# Patient Record
Sex: Female | Born: 1945 | Race: White | Hispanic: No | Marital: Married | Smoking: Never smoker
Health system: Southern US, Community
[De-identification: ages and names within clinical notes are randomized; demographics above are authoritative.]

## PROBLEM LIST (undated history)

## (undated) DIAGNOSIS — I10 Essential (primary) hypertension: Secondary | ICD-10-CM

## (undated) DIAGNOSIS — H269 Unspecified cataract: Secondary | ICD-10-CM

## (undated) DIAGNOSIS — T7840XA Allergy, unspecified, initial encounter: Secondary | ICD-10-CM

## (undated) DIAGNOSIS — M1990 Unspecified osteoarthritis, unspecified site: Secondary | ICD-10-CM

## (undated) DIAGNOSIS — B009 Herpesviral infection, unspecified: Secondary | ICD-10-CM

## (undated) DIAGNOSIS — M8580 Other specified disorders of bone density and structure, unspecified site: Secondary | ICD-10-CM

## (undated) DIAGNOSIS — E0590 Thyrotoxicosis, unspecified without thyrotoxic crisis or storm: Secondary | ICD-10-CM

## (undated) HISTORY — PX: TONSILLECTOMY: SUR1361

## (undated) HISTORY — PX: ABDOMINAL HYSTERECTOMY: SHX81

## (undated) HISTORY — PX: APPENDECTOMY: SHX54

## (undated) HISTORY — DX: Thyrotoxicosis, unspecified without thyrotoxic crisis or storm: E05.90

## (undated) HISTORY — DX: Unspecified osteoarthritis, unspecified site: M19.90

## (undated) HISTORY — PX: CARPAL TUNNEL RELEASE: SHX101

## (undated) HISTORY — DX: Essential (primary) hypertension: I10

## (undated) HISTORY — DX: Allergy, unspecified, initial encounter: T78.40XA

## (undated) HISTORY — DX: Other specified disorders of bone density and structure, unspecified site: M85.80

## (undated) HISTORY — DX: Herpesviral infection, unspecified: B00.9

## (undated) HISTORY — DX: Unspecified cataract: H26.9

## (undated) HISTORY — PX: FOOT SURGERY: SHX648

---

## 1999-02-07 ENCOUNTER — Other Ambulatory Visit: Admission: RE | Admit: 1999-02-07 | Discharge: 1999-02-07 | Payer: Self-pay | Admitting: *Deleted

## 1999-08-07 ENCOUNTER — Encounter: Payer: Self-pay | Admitting: *Deleted

## 1999-08-07 ENCOUNTER — Encounter: Admission: RE | Admit: 1999-08-07 | Discharge: 1999-08-07 | Payer: Self-pay | Admitting: *Deleted

## 1999-08-09 ENCOUNTER — Encounter: Payer: Self-pay | Admitting: *Deleted

## 1999-08-09 ENCOUNTER — Encounter: Admission: EM | Admit: 1999-08-09 | Discharge: 1999-08-09 | Payer: Self-pay | Admitting: *Deleted

## 2000-02-27 ENCOUNTER — Other Ambulatory Visit: Admission: RE | Admit: 2000-02-27 | Discharge: 2000-02-27 | Payer: Self-pay | Admitting: Family Medicine

## 2000-08-13 ENCOUNTER — Encounter: Admission: RE | Admit: 2000-08-13 | Discharge: 2000-08-13 | Payer: Self-pay | Admitting: Family Medicine

## 2000-08-13 ENCOUNTER — Encounter: Payer: Self-pay | Admitting: Family Medicine

## 2001-04-16 LAB — HM COLONOSCOPY: HM Colonoscopy: NORMAL

## 2001-04-29 ENCOUNTER — Ambulatory Visit (HOSPITAL_COMMUNITY): Admission: RE | Admit: 2001-04-29 | Discharge: 2001-04-29 | Payer: Self-pay | Admitting: Gastroenterology

## 2001-04-29 ENCOUNTER — Encounter (INDEPENDENT_AMBULATORY_CARE_PROVIDER_SITE_OTHER): Payer: Self-pay

## 2001-11-03 ENCOUNTER — Encounter: Payer: Self-pay | Admitting: Family Medicine

## 2001-11-03 ENCOUNTER — Encounter: Admission: RE | Admit: 2001-11-03 | Discharge: 2001-11-03 | Payer: Self-pay | Admitting: Family Medicine

## 2002-03-04 ENCOUNTER — Other Ambulatory Visit: Admission: RE | Admit: 2002-03-04 | Discharge: 2002-03-04 | Payer: Self-pay | Admitting: Family Medicine

## 2003-05-05 ENCOUNTER — Encounter: Admission: RE | Admit: 2003-05-05 | Discharge: 2003-05-05 | Payer: Self-pay | Admitting: Family Medicine

## 2004-01-05 ENCOUNTER — Encounter: Admission: RE | Admit: 2004-01-05 | Discharge: 2004-01-05 | Payer: Self-pay | Admitting: Family Medicine

## 2004-05-15 ENCOUNTER — Ambulatory Visit: Payer: Self-pay | Admitting: Family Medicine

## 2004-05-16 ENCOUNTER — Encounter: Admission: RE | Admit: 2004-05-16 | Discharge: 2004-05-16 | Payer: Self-pay | Admitting: Family Medicine

## 2004-05-18 ENCOUNTER — Ambulatory Visit: Payer: Self-pay | Admitting: Family Medicine

## 2004-05-18 ENCOUNTER — Other Ambulatory Visit: Admission: RE | Admit: 2004-05-18 | Discharge: 2004-05-18 | Payer: Self-pay | Admitting: Family Medicine

## 2004-05-19 ENCOUNTER — Encounter: Payer: Self-pay | Admitting: Family Medicine

## 2004-05-19 LAB — CONVERTED CEMR LAB: Pap Smear: NORMAL

## 2004-10-23 ENCOUNTER — Ambulatory Visit: Payer: Self-pay | Admitting: Family Medicine

## 2005-01-17 ENCOUNTER — Ambulatory Visit: Payer: Self-pay | Admitting: Family Medicine

## 2005-02-19 ENCOUNTER — Ambulatory Visit: Payer: Self-pay | Admitting: Family Medicine

## 2005-04-25 ENCOUNTER — Ambulatory Visit: Payer: Self-pay | Admitting: Family Medicine

## 2005-05-30 ENCOUNTER — Ambulatory Visit: Payer: Self-pay | Admitting: Family Medicine

## 2005-06-01 ENCOUNTER — Encounter: Admission: RE | Admit: 2005-06-01 | Discharge: 2005-06-01 | Payer: Self-pay | Admitting: Family Medicine

## 2005-06-07 ENCOUNTER — Ambulatory Visit: Payer: Self-pay | Admitting: Family Medicine

## 2005-06-08 ENCOUNTER — Ambulatory Visit: Payer: Self-pay | Admitting: Family Medicine

## 2005-07-19 ENCOUNTER — Ambulatory Visit: Payer: Self-pay | Admitting: Internal Medicine

## 2005-09-13 ENCOUNTER — Ambulatory Visit: Payer: Self-pay | Admitting: Family Medicine

## 2005-09-19 ENCOUNTER — Ambulatory Visit: Payer: Self-pay | Admitting: Family Medicine

## 2005-12-25 ENCOUNTER — Ambulatory Visit: Payer: Self-pay | Admitting: Family Medicine

## 2006-03-05 ENCOUNTER — Ambulatory Visit: Payer: Self-pay | Admitting: Family Medicine

## 2006-04-25 ENCOUNTER — Ambulatory Visit: Payer: Self-pay | Admitting: Family Medicine

## 2006-05-16 ENCOUNTER — Encounter: Admission: RE | Admit: 2006-05-16 | Discharge: 2006-05-16 | Payer: Self-pay | Admitting: Family Medicine

## 2006-06-05 ENCOUNTER — Ambulatory Visit: Payer: Self-pay | Admitting: Family Medicine

## 2006-06-05 LAB — CONVERTED CEMR LAB
AST: 29 units/L (ref 0–37)
Alkaline Phosphatase: 57 units/L (ref 39–117)
BUN: 20 mg/dL (ref 6–23)
Basophils Relative: 0.6 % (ref 0.0–1.0)
CO2: 30 meq/L (ref 19–32)
Creatinine, Ser: 0.8 mg/dL (ref 0.4–1.2)
Direct LDL: 118.5 mg/dL
HCT: 38.4 % (ref 36.0–46.0)
Hemoglobin: 13.3 g/dL (ref 12.0–15.0)
Monocytes Absolute: 0.7 10*3/uL (ref 0.2–0.7)
Neutrophils Relative %: 57.8 % (ref 43.0–77.0)
Potassium: 4 meq/L (ref 3.5–5.1)
RDW: 12.7 % (ref 11.5–14.6)
Sodium: 142 meq/L (ref 135–145)
Total Bilirubin: 0.7 mg/dL (ref 0.3–1.2)
Total CHOL/HDL Ratio: 3
Total Protein: 7 g/dL (ref 6.0–8.3)
VLDL: 12 mg/dL (ref 0–40)

## 2006-06-07 ENCOUNTER — Ambulatory Visit: Payer: Self-pay | Admitting: Family Medicine

## 2006-06-12 ENCOUNTER — Ambulatory Visit: Payer: Self-pay | Admitting: Family Medicine

## 2006-07-01 ENCOUNTER — Ambulatory Visit: Payer: Self-pay | Admitting: Internal Medicine

## 2006-08-27 LAB — CONVERTED CEMR LAB
OCCULT 3: NEGATIVE
OCCULT 3: NEGATIVE

## 2006-08-28 LAB — CONVERTED CEMR LAB
OCCULT 2: NEGATIVE
OCCULT 2: NEGATIVE

## 2006-09-06 ENCOUNTER — Ambulatory Visit: Payer: Self-pay | Admitting: Family Medicine

## 2006-09-10 ENCOUNTER — Encounter (INDEPENDENT_AMBULATORY_CARE_PROVIDER_SITE_OTHER): Payer: Self-pay | Admitting: *Deleted

## 2006-09-18 ENCOUNTER — Telehealth (INDEPENDENT_AMBULATORY_CARE_PROVIDER_SITE_OTHER): Payer: Self-pay | Admitting: *Deleted

## 2006-09-25 ENCOUNTER — Encounter: Admission: RE | Admit: 2006-09-25 | Discharge: 2006-09-25 | Payer: Self-pay | Admitting: Family Medicine

## 2006-10-21 ENCOUNTER — Encounter: Payer: Self-pay | Admitting: Family Medicine

## 2006-10-21 DIAGNOSIS — G5600 Carpal tunnel syndrome, unspecified upper limb: Secondary | ICD-10-CM

## 2006-10-21 DIAGNOSIS — B009 Herpesviral infection, unspecified: Secondary | ICD-10-CM | POA: Insufficient documentation

## 2006-10-21 DIAGNOSIS — G43909 Migraine, unspecified, not intractable, without status migrainosus: Secondary | ICD-10-CM | POA: Insufficient documentation

## 2006-10-21 DIAGNOSIS — G44209 Tension-type headache, unspecified, not intractable: Secondary | ICD-10-CM

## 2006-10-21 DIAGNOSIS — Z862 Personal history of diseases of the blood and blood-forming organs and certain disorders involving the immune mechanism: Secondary | ICD-10-CM | POA: Insufficient documentation

## 2006-10-21 DIAGNOSIS — M8580 Other specified disorders of bone density and structure, unspecified site: Secondary | ICD-10-CM | POA: Insufficient documentation

## 2006-10-21 DIAGNOSIS — J309 Allergic rhinitis, unspecified: Secondary | ICD-10-CM | POA: Insufficient documentation

## 2006-10-21 DIAGNOSIS — N6019 Diffuse cystic mastopathy of unspecified breast: Secondary | ICD-10-CM

## 2006-10-21 DIAGNOSIS — Z8639 Personal history of other endocrine, nutritional and metabolic disease: Secondary | ICD-10-CM

## 2006-10-22 ENCOUNTER — Ambulatory Visit: Payer: Self-pay | Admitting: Family Medicine

## 2007-01-27 ENCOUNTER — Ambulatory Visit: Payer: Self-pay | Admitting: Family Medicine

## 2007-01-29 ENCOUNTER — Telehealth: Payer: Self-pay | Admitting: Family Medicine

## 2007-04-21 ENCOUNTER — Ambulatory Visit: Payer: Self-pay | Admitting: Family Medicine

## 2007-05-22 ENCOUNTER — Encounter: Admission: RE | Admit: 2007-05-22 | Discharge: 2007-05-22 | Payer: Self-pay | Admitting: Family Medicine

## 2007-05-23 ENCOUNTER — Encounter: Payer: Self-pay | Admitting: Family Medicine

## 2007-05-23 ENCOUNTER — Encounter (INDEPENDENT_AMBULATORY_CARE_PROVIDER_SITE_OTHER): Payer: Self-pay | Admitting: *Deleted

## 2007-05-27 ENCOUNTER — Ambulatory Visit: Payer: Self-pay | Admitting: Family Medicine

## 2007-05-27 DIAGNOSIS — I10 Essential (primary) hypertension: Secondary | ICD-10-CM | POA: Insufficient documentation

## 2007-05-29 LAB — CONVERTED CEMR LAB
Basophils Relative: 0.8 % (ref 0.0–1.0)
Bilirubin, Direct: 0.1 mg/dL (ref 0.0–0.3)
CO2: 32 meq/L (ref 19–32)
Eosinophils Absolute: 0.3 10*3/uL (ref 0.0–0.6)
Eosinophils Relative: 4.4 % (ref 0.0–5.0)
GFR calc Af Amer: 94 mL/min
GFR calc non Af Amer: 78 mL/min
Glucose, Bld: 89 mg/dL (ref 70–99)
HCT: 39.5 % (ref 36.0–46.0)
Hemoglobin: 13.1 g/dL (ref 12.0–15.0)
Lymphocytes Relative: 28.8 % (ref 12.0–46.0)
MCV: 90.9 fL (ref 78.0–100.0)
Monocytes Absolute: 0.6 10*3/uL (ref 0.2–0.7)
Neutro Abs: 3.3 10*3/uL (ref 1.4–7.7)
Neutrophils Relative %: 55.9 % (ref 43.0–77.0)
Platelets: 259 10*3/uL (ref 150–400)
Potassium: 4.2 meq/L (ref 3.5–5.1)
Sodium: 141 meq/L (ref 135–145)
TSH: 2.91 microintl units/mL (ref 0.35–5.50)
Total Protein: 7.4 g/dL (ref 6.0–8.3)
Triglycerides: 102 mg/dL (ref 0–149)
WBC: 5.9 10*3/uL (ref 4.5–10.5)

## 2007-06-20 ENCOUNTER — Ambulatory Visit: Payer: Self-pay | Admitting: Family Medicine

## 2007-06-24 ENCOUNTER — Ambulatory Visit: Payer: Self-pay | Admitting: Cardiology

## 2007-06-26 ENCOUNTER — Encounter: Admission: RE | Admit: 2007-06-26 | Discharge: 2007-06-26 | Payer: Self-pay | Admitting: Family Medicine

## 2007-07-01 ENCOUNTER — Ambulatory Visit: Payer: Self-pay

## 2007-07-01 ENCOUNTER — Encounter: Payer: Self-pay | Admitting: Family Medicine

## 2007-07-07 ENCOUNTER — Ambulatory Visit: Payer: Self-pay | Admitting: Family Medicine

## 2007-07-21 ENCOUNTER — Ambulatory Visit: Payer: Self-pay | Admitting: Family Medicine

## 2007-09-15 ENCOUNTER — Ambulatory Visit: Payer: Self-pay | Admitting: Family Medicine

## 2007-10-27 ENCOUNTER — Ambulatory Visit: Payer: Self-pay | Admitting: Family Medicine

## 2007-12-18 ENCOUNTER — Ambulatory Visit: Payer: Self-pay | Admitting: Family Medicine

## 2008-01-14 ENCOUNTER — Ambulatory Visit: Payer: Self-pay | Admitting: Family Medicine

## 2008-06-03 ENCOUNTER — Encounter: Admission: RE | Admit: 2008-06-03 | Discharge: 2008-06-03 | Payer: Self-pay | Admitting: Family Medicine

## 2008-06-08 ENCOUNTER — Encounter (INDEPENDENT_AMBULATORY_CARE_PROVIDER_SITE_OTHER): Payer: Self-pay | Admitting: *Deleted

## 2008-07-20 ENCOUNTER — Telehealth (INDEPENDENT_AMBULATORY_CARE_PROVIDER_SITE_OTHER): Payer: Self-pay | Admitting: *Deleted

## 2008-07-22 ENCOUNTER — Ambulatory Visit: Payer: Self-pay | Admitting: Family Medicine

## 2008-07-25 LAB — CONVERTED CEMR LAB
ALT: 17 units/L (ref 0–35)
Albumin: 3.7 g/dL (ref 3.5–5.2)
BUN: 18 mg/dL (ref 6–23)
Basophils Relative: 0.5 % (ref 0.0–3.0)
CO2: 29 meq/L (ref 19–32)
Chloride: 107 meq/L (ref 96–112)
Cholesterol: 177 mg/dL (ref 0–200)
Creatinine, Ser: 0.8 mg/dL (ref 0.4–1.2)
Eosinophils Absolute: 0.2 10*3/uL (ref 0.0–0.7)
Eosinophils Relative: 4 % (ref 0.0–5.0)
HCT: 37.9 % (ref 36.0–46.0)
LDL Cholesterol: 113 mg/dL — ABNORMAL HIGH (ref 0–99)
Lymphs Abs: 1.5 10*3/uL (ref 0.7–4.0)
MCHC: 33.3 g/dL (ref 30.0–36.0)
MCV: 91.7 fL (ref 78.0–100.0)
Monocytes Absolute: 0.7 10*3/uL (ref 0.1–1.0)
Neutrophils Relative %: 59.5 % (ref 43.0–77.0)
Potassium: 4.5 meq/L (ref 3.5–5.1)
RBC: 4.13 M/uL (ref 3.87–5.11)
TSH: 0.86 microintl units/mL (ref 0.35–5.50)
Total CHOL/HDL Ratio: 4
Total Protein: 6.8 g/dL (ref 6.0–8.3)
Triglycerides: 90 mg/dL (ref 0.0–149.0)
Vit D, 25-Hydroxy: 32 ng/mL (ref 30–89)
WBC: 5.9 10*3/uL (ref 4.5–10.5)

## 2008-07-29 ENCOUNTER — Ambulatory Visit: Payer: Self-pay | Admitting: Family Medicine

## 2008-07-29 ENCOUNTER — Encounter: Payer: Self-pay | Admitting: Family Medicine

## 2008-07-29 LAB — CONVERTED CEMR LAB
Glucose, Urine, Semiquant: NEGATIVE
Nitrite: NEGATIVE
Specific Gravity, Urine: <1.005
Whiff Test: NEGATIVE
pH: 7

## 2008-07-29 LAB — HM PAP SMEAR

## 2008-08-03 ENCOUNTER — Telehealth: Payer: Self-pay | Admitting: Family Medicine

## 2008-09-21 ENCOUNTER — Telehealth: Payer: Self-pay | Admitting: Family Medicine

## 2008-10-01 ENCOUNTER — Telehealth (INDEPENDENT_AMBULATORY_CARE_PROVIDER_SITE_OTHER): Payer: Self-pay | Admitting: *Deleted

## 2008-11-05 ENCOUNTER — Ambulatory Visit: Payer: Self-pay | Admitting: Family Medicine

## 2008-11-05 LAB — CONVERTED CEMR LAB
Ketones, urine, test strip: NEGATIVE
Nitrite: NEGATIVE
Protein, U semiquant: NEGATIVE
Urobilinogen, UA: 0.2
pH: 6

## 2009-01-26 ENCOUNTER — Ambulatory Visit: Payer: Self-pay | Admitting: Family Medicine

## 2009-03-22 ENCOUNTER — Telehealth: Payer: Self-pay | Admitting: Family Medicine

## 2009-03-23 ENCOUNTER — Ambulatory Visit: Payer: Self-pay | Admitting: Family Medicine

## 2009-03-23 DIAGNOSIS — H8110 Benign paroxysmal vertigo, unspecified ear: Secondary | ICD-10-CM | POA: Insufficient documentation

## 2009-04-07 ENCOUNTER — Encounter: Payer: Self-pay | Admitting: Family Medicine

## 2009-05-30 ENCOUNTER — Ambulatory Visit: Payer: Self-pay | Admitting: Family Medicine

## 2009-06-13 ENCOUNTER — Encounter: Admission: RE | Admit: 2009-06-13 | Discharge: 2009-06-13 | Payer: Self-pay | Admitting: Family Medicine

## 2009-07-05 ENCOUNTER — Encounter (INDEPENDENT_AMBULATORY_CARE_PROVIDER_SITE_OTHER): Payer: Self-pay | Admitting: *Deleted

## 2009-08-16 ENCOUNTER — Telehealth: Payer: Self-pay | Admitting: Family Medicine

## 2009-08-22 ENCOUNTER — Ambulatory Visit: Payer: Self-pay | Admitting: Family Medicine

## 2009-08-22 DIAGNOSIS — E7800 Pure hypercholesterolemia, unspecified: Secondary | ICD-10-CM

## 2009-08-22 LAB — CONVERTED CEMR LAB
Alkaline Phosphatase: 61 units/L (ref 39–117)
Basophils Absolute: 0 10*3/uL (ref 0.0–0.1)
Bilirubin, Direct: 0 mg/dL (ref 0.0–0.3)
CO2: 30 meq/L (ref 19–32)
Calcium: 9.4 mg/dL (ref 8.4–10.5)
Creatinine, Ser: 0.7 mg/dL (ref 0.4–1.2)
Eosinophils Absolute: 0.3 10*3/uL (ref 0.0–0.7)
GFR calc non Af Amer: 85.94 mL/min (ref >60)
Glucose, Bld: 102 mg/dL — ABNORMAL HIGH (ref 70–99)
HDL: 56 mg/dL (ref >39.00)
Lymphocytes Relative: 30.1 % (ref 12.0–46.0)
MCHC: 34.4 g/dL (ref 30.0–36.0)
Monocytes Relative: 10.1 % (ref 3.0–12.0)
Neutrophils Relative %: 54.3 % (ref 43.0–77.0)
RBC: 4.12 M/uL (ref 3.87–5.11)
RDW: 13.8 % (ref 11.5–14.6)
Total CHOL/HDL Ratio: 3
Triglycerides: 69 mg/dL (ref 0.0–149.0)
VLDL: 13.8 mg/dL (ref 0.0–40.0)

## 2009-08-25 ENCOUNTER — Ambulatory Visit: Payer: Self-pay | Admitting: Family Medicine

## 2009-08-25 DIAGNOSIS — M79609 Pain in unspecified limb: Secondary | ICD-10-CM | POA: Insufficient documentation

## 2009-08-29 ENCOUNTER — Ambulatory Visit: Payer: Self-pay | Admitting: Family Medicine

## 2009-08-29 DIAGNOSIS — M545 Low back pain: Secondary | ICD-10-CM

## 2009-08-29 DIAGNOSIS — M76899 Other specified enthesopathies of unspecified lower limb, excluding foot: Secondary | ICD-10-CM | POA: Insufficient documentation

## 2009-08-29 DIAGNOSIS — M5430 Sciatica, unspecified side: Secondary | ICD-10-CM

## 2009-08-29 DIAGNOSIS — M629 Disorder of muscle, unspecified: Secondary | ICD-10-CM | POA: Insufficient documentation

## 2009-08-30 ENCOUNTER — Telehealth: Payer: Self-pay | Admitting: Family Medicine

## 2009-09-01 ENCOUNTER — Encounter: Admission: RE | Admit: 2009-09-01 | Discharge: 2009-09-01 | Payer: Self-pay | Admitting: Family Medicine

## 2010-05-16 NOTE — Progress Notes (Signed)
Summary: BP is better  Phone Note Call from Patient Call back at [phone]   Caller: Patient Call For: [name] MD Summary of Call: Pt saw Dr Patsy Lager on 08/29/09 and BP was 132/84. Pt wants to know if still needs to come in in 2 weeks to f/u on BP with a nurse visit. pt has another f/u appt with Dr copland on 10/07/09. Please advise.  Initial call taken by: Lewanda Rife LPN,  Aug 30, 2009 8:37 AM  Follow-up for Phone Call        thanks - bp is better- can cancel that nurse visit  Follow-up by: [name] MD,  Aug 30, 2009 12:25 PM  Additional Follow-up for Phone Call Additional follow up Details #1::        Patient notified as instructed by telephone. Nurse visit cancelled as instructed.Lewanda Rife LPN  Aug 30, 2009 12:45 PM

## 2010-05-16 NOTE — Assessment & Plan Note (Signed)
Summary: ROA FOR LEG PAIN AND BACK PAIN PER DR.TOWER/JRR   Vital Signs:  Patient profile:   65 year old female Weight:      176 pounds Temp:     98.2 degrees F oral Pulse rate:   72 / minute Pulse rhythm:   regular BP sitting:   132 / 84  (left arm) Cuff size:   regular  Vitals Entered By: Lowella Petties CMA (Aug 29, 2009 3:36 PM)  CC: Bilateral leg pain, pain across lower back.   History of Present Illness: 65 year old female seen at the request of Dr. Milinda Antis for evaluation of hip pain, buttocks pain, back pain with radiculopathy on the L.  Hips ongoing since March, no inciting incident or trauma. When pressed, lateral hip pain has been ongoing now for about a year. Has tried intermittent Motrin, and a TENS unit. which helped some. o/w no interventions. 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  1. Primary pain L lateral leg, moving downwards, will get some pain and a severe  Cannot cross legs - hurts L laterally. Some in groin. Pain in her lower back   Will get a throbbing pain in her lower leg.  Down posterior and laterally. Did have some numbness last week in L toes. None now.  Has been ongoing since march. Felt a little bit on the R on sat. - but generally most on L   No traumas. Here with husband.  Allergies: 1)  Ceftin 2)  * Zyrtec 3)  * Allegra 4)  Prednisone 5)  Beta Blockers 6)  * Eggs 7)  * Antihistamines  Past History:  Past medical, surgical, family and social histories (including risk factors) reviewed, and no changes noted (except as noted below).  Past Medical History: Reviewed history from 12/18/2007 and no changes required. Allergic rhinitis Osteopenia HTN HSV  Past Surgical History: Reviewed history from 06/20/2007 and no changes required. Appendectomy Hysterectomy- for bleeding and ov cyst Tonsillectomy Dexa- normal (2000) Bone marrow biopsy (1610'R) EGD- biopsy, benign (04/2001). CT sinuses- sinusitis (05/2001) NCV- neg (01/2004) Dexa-  normal (04/2004) Colonoscopy- hemorrhoids (04/2001) Dexa- osteopenia LS-.5, FN -1.0 (09/2006)    Family History: Reviewed history from 06/20/2007 and no changes required. Father: deceased-, smoker, ETOH, esoph cancer Mother: deceased- 5 vess CABG Siblings:  brother CAD sister RA   Social History: Reviewed history from 06/20/2007 and no changes required. Marital Status: Married Children: 1 son Occupation: waitress quit smoking (brief smoking in past)- over 20 years ago  Review of Systems      See HPI       Otherwise, the pertinent positives and negatives are listed above and in the HPI, otherwise a full review of systems has been reviewed and is negative unless noted positive.  General:  Denies chills and fever. MS:  Complains of joint pain, low back pain, muscle weakness, and stiffness; denies joint redness and joint swelling. Neuro:  Complains of numbness and tingling.  Physical Exam  General:  Well-developed,well-nourished,in no acute distress; alert,appropriate and cooperative throughout examination Head:  Normocephalic and atraumatic without obvious abnormalities. No apparent alopecia or balding. Ears:  no external deformities.   Nose:  no external deformity.   Neck:  No deformities, masses, or tenderness noted. Lungs:  normal respiratory effort.   Extremities:  No clubbing, cyanosis, edema, or deformity noted with normal full range of motion of all joints.   Neurologic:  alert & oriented X3 and gait normal.   Psych:  Cognition and judgment  appear intact. Alert and cooperative with normal attention span and concentration. No apparent delusions, illusions, hallucinations Additional Exam:  Range of motion at  the waist: full Flexion: mild pain Extension: mild pain Rotation: mild pain  No echymosis or edema Rises to examination table with mild difficulty Gait: minimally antalgic   Inspection/Deformity: N Paraspinus T: Y  B Ankle Dorsiflexion (L5,4): 5/5 B Great  Toe Dorsiflexion (L5,4): 5/5 Heel Walk (L5): WNL Toe Walk (S1): WNL Rise/Squat (L4): WNL, mild pain  SENSORY B Medial Foot (L4): WNL B Dorsum (L5): WNL B Lateral (S1): WNL Light Touch: WNL Pinprick: WNL  REFLEXES Knee (L4): 2+ Ankle (S1): 2+  B SLR, seated: neg B SLR, supine: neg B FABER: neg B Reverse FABER: L markedly Pos B Greater Troch: L > R, tender B Log Roll: neg B Stork: NT B Sciatic Notch: TTP B Leg Lengths: equal   HIP EXAM: SIDE: L ROM: Abduction, Flexion, Internal and External range of motion: full Pain with terminal IROM and EROM: mild with both Knees: No effusion Piriformis: tender at direct palpation Str: flexion: 5/5 abduction: 5/5 adduction: 5/5 Strength testing non-tender    Impression & Recommendations:  Problem # 1:  TROCHANTERIC BURSITIS, LEFT (ICD-726.5) Multi-part problem. Back pain and SI joint pain B with considerable troch bursitis. ITB is very TTP.  Habitus and work situation contribute. Long term PT with weight loss and HEP daily will likely help.  Start with meds below, too, and inj GTB  cc: Dr. Milinda Antis   Trochanteric Bursitis Injection Verbal consent obtained. Risks, benefits, and alternatives reviewed. L greater trochanter sterilely prepped with Betadine. Ethyl Chloride used for anesthesia. 8 cc of Marcaine 0.5% injected with 2 cc of 40 mg Kenalog into trochanteric bursa at area of maximal tenderness at greater trochanter. Needle taken to bone to troch bursa, flows easily. Bursa massaged. No bleeding and no complications. Decreased pain after injection. Needle: 22 gauge spinal needle   Orders: Physical Therapy Referral (PT) Joint Aspirate / Injection, Large (20610) Kenalog 10mg  (4units) (J3301)  Problem # 2:  SCIATICA, LEFT (ICD-724.3) Assessment: New  The following medications were removed from the medication list:    Ibuprofen 800 Mg Tabs (Ibuprofen) .Marland Kitchen... Take one tablet every 8 hours as needed for pain- take with food Her  updated medication list for this problem includes:    Diclofenac Sodium 75 Mg Tbec (Diclofenac sodium) .Marland Kitchen... 1 by mouth two times a day    Tizanidine Hcl 4 Mg Tabs (Tizanidine hcl) .Marland Kitchen... 1 by mouth at bedtime  Orders: Radiology Referral (Radiology) Physical Therapy Referral (PT)  Problem # 3:  BACK PAIN, LUMBAR (ICD-724.2) Assessment: New  The following medications were removed from the medication list:    Ibuprofen 800 Mg Tabs (Ibuprofen) .Marland Kitchen... Take one tablet every 8 hours as needed for pain- take with food Her updated medication list for this problem includes:    Diclofenac Sodium 75 Mg Tbec (Diclofenac sodium) .Marland Kitchen... 1 by mouth two times a day    Tizanidine Hcl 4 Mg Tabs (Tizanidine hcl) .Marland Kitchen... 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 Take one by mouth daily 2)  Valtrex 500 Mg Tabs (Valacyclovir hcl) .... Take one by mouth daily as needed for break out 3)  Synthroid 125 Mcg Tabs (Levothyroxine  sodium) .... Take one by mouth daily 4)  Glucosamine Complex Tabs (Glucosamine hcl-glucosamin so4 tabs) .... Take one by mouth daily 5)  Vitamin B-12 500 Mcg Tabs (Cyanocobalamin) .... Take one by mouth daily 6)  Relpax 40 Mg Tabs (Eletriptan hydrobromide) .... Take 1 tablet by mouth once a day as needed 7)  Vitamin E 400 Unit Caps (Vitamin e) .... Take one by mouth daily 8)  Multivitamins Tabs (Multiple vitamin) .... Take 1 tablet by mouth once a day 9)  Calcium Carbonate- 600  .... 2 tabs by mouth daily 10)  Celery Seed Tablets Otc  .Marland Kitchen.. 3 daily 11)  Promethazine Hcl 25 Mg Tabs (Promethazine hcl) .Marland Kitchen.. 1 by mouth up to three times a day as needed nausea 12)  Claritin 10 Mg Tabs (Loratadine) .Marland Kitchen.. 1 once daily as needed 13)  Meclizine Hcl 25  Mg Tabs (Meclizine hcl) .Marland Kitchen.. 1-2 by mouth up to three times a day as needed dizziness 14)  Mucinex 600 Mg Xr12h-tab (Guaifenesin) .... Otc as directed. 15)  Voltaren 1 % Gel (Diclofenac sodium) .... As directed 16)  Zovirax 5 % Crea (Acyclovir) .... Apply to affected area 5 times daily as needed breakout 17)  Diclofenac Sodium 75 Mg Tbec (Diclofenac sodium) .Marland Kitchen.. 1 by mouth two times a day 18)  Tizanidine Hcl 4 Mg Tabs (Tizanidine hcl) .Marland Kitchen.. 1 by mouth at bedtime  Patient Instructions: 1)  Referral Appointment Information 2)  Day/Date: 3)  Time: 4)  Place/MD: 5)  Address: 6)  Phone/Fax: 7)  Patient given appointment information. Information/Orders faxed/mailed.  Prescriptions: TIZANIDINE HCL 4 MG TABS (TIZANIDINE HCL) 1 by mouth at bedtime  #30 x 1   Entered and Authorized by:   Hannah Beat MD   Signed by:   Hannah Beat MD on 08/29/2009   Method used:   Print then Give to Patient   RxID:   [id number] DICLOFENAC SODIUM 75 MG TBEC (DICLOFENAC SODIUM) 1 by mouth two times a day  #60 x 2   Entered and Authorized by:   Hannah Beat MD   Signed by:   Hannah Beat MD on 08/29/2009   Method used:   Print then Give to Patient   RxID:   [id number]   Prior Medications (reviewed today): NORVASC 5 MG  TABS (AMLODIPINE BESYLATE) take one by mouth daily VALTREX 500 MG  TABS (VALACYCLOVIR HCL) take one by mouth daily as needed for break out SYNTHROID 125 MCG  TABS (LEVOTHYROXINE SODIUM) take one by mouth daily GLUCOSAMINE COMPLEX   TABS (GLUCOSAMINE HCL-GLUCOSAMIN SO4 TABS) take one by mouth daily VITAMIN B-12 500 MCG  TABS (CYANOCOBALAMIN) take one by mouth daily RELPAX 40 MG TABS (ELETRIPTAN HYDROBROMIDE) Take 1 tablet by mouth once a day as needed VITAMIN E 400 UNIT  CAPS (VITAMIN E) take one by mouth daily MULTIVITAMINS   TABS (MULTIPLE VITAMIN) Take 1 tablet by mouth once a day CALCIUM CARBONATE- 600 () 2 tabs by mouth daily CELERY SEED TABLETS OTC () 3  daily PROMETHAZINE HCL 25 MG TABS (PROMETHAZINE HCL) 1 by mouth up to three times a day as needed nausea CLARITIN 10 MG TABS (LORATADINE) 1 once daily as needed MECLIZINE HCL 25 MG TABS (MECLIZINE HCL) 1-2 by mouth up to three times a day as needed dizziness MUCINEX 600 MG XR12H-TAB (GUAIFENESIN) OTC As directed. VOLTAREN 1 % GEL (DICLOFENAC SODIUM) as directed ZOVIRAX 5 % CREA (ACYCLOVIR) apply to affected area 5 times daily as needed breakout Current Allergies: CEFTIN * ZYRTEC *  ALLEGRA PREDNISONE BETA BLOCKERS * EGGS * ANTIHISTAMINES

## 2010-05-16 NOTE — Assessment & Plan Note (Signed)
Summary: CPX/CLE   Vital Signs:  Patient profile:   65 year old female Height:      63 inches Weight:      174.25 pounds BMI:     30.98 Temp:     98.4 degrees F oral Pulse rate:   72 / minute Pulse rhythm:   regular BP sitting:   154 / 82  (left arm) Cuff size:   regular  Vitals Entered By: Lewanda Rife LPN (Aug 25, 2009 10:57 AM) CC: CPX LMP [id number]   History of Present Illness: here for health mt and to rev chronic medical problems  bp is 154/82- higher than usual   (very hectic 2 weeks caring for sick MIL) has not exercised due to stress - will be able to start next week   wt is stable with bmi of 30  hyst in past for bleeding  pap 4/10  osteopenia - last dexa 6/10 her vit D level is 25 down from 32 has not been taking her vitamins   mam nl 2/11  no lumps or changes on self exam   colonosc 03 -- due 10 y  Td 02  lipids ok with trig 69/ HDL 56/ and LDL 119-- is stable , higher HDL and eating right    thyroid stable - no changes needed   lot of trouble with L leg - pain from hip down - uses ibuprofen and tens unit also chronic lower back pain trouble ext rot hip   Allergies: 1)  Ceftin 2)  * Zyrtec 3)  * Allegra 4)  Prednisone 5)  Beta Blockers 6)  * Eggs 7)  * Antihistamines  Past History:  Past Medical History: Last updated: 12/18/2007 Allergic rhinitis Osteopenia HTN HSV  Past Surgical History: Last updated: 2007/06/23 Appendectomy Hysterectomy- for bleeding and ov cyst Tonsillectomy Dexa- normal (2000) Bone marrow biopsy (1980's) EGD- biopsy, benign (04/2001). 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 Eyes:  Denies blurring and eye pain. CV:  Denies chest pain or discomfort, lightheadness, and palpitations. Resp:  Denies cough, shortness of breath, and wheezing. GU:  Denies abnormal vaginal bleeding, discharge, dysuria, hematuria, and urinary frequency. MS:  Complains of joint pain, muscle aches, and stiffness; denies joint redness, joint swelling, and cramps. Derm:  Denies itching, lesion(s), poor wound healing, and rash. Neuro:  Denies numbness and tingling. Psych:  Denies anxiety and depression. Endo:  Denies cold intolerance, excessive thirst, excessive urination, and heat intolerance. Heme:  Denies abnormal bruising and bleeding.  Physical Exam  General:  overweight but generally well appearing  Head:  normocephalic, atraumatic, and no abnormalities observed.   Eyes:  vision grossly intact, pupils equal, and pupils round.   Ears:  R ear normal and L ear normal.   Nose:  no nasal discharge.  - nares are boggy Mouth:  pharynx pink and moist.   Neck:  supple with full rom and no masses or thyromegally, no JVD or carotid bruit  Chest Wall:  No deformities, masses, or tenderness  noted. Lungs:  CTA with harsh bs at bases and occ rhonchi  no rales  scant wheeze on forced exp only  Heart:  Normal rate and regular rhythm. S1 and S2 normal without gallop, murmur, click, rub or other extra sounds. Abdomen:  Bowel sounds positive,abdomen soft and non-tender without masses, organomegaly or hernias noted. no renal bruits  Msk:  No deformity or scoliosis noted of thoracic or lumbar spine.  some pain on elevation of L leg and full flex of knee no LS tenderness- nl rom Pulses:  R and L carotid,radial,femoral,dorsalis pedis and posterior tibial  pulses are full and equal bilaterally Extremities:  No clubbing, cyanosis, edema, or deformity noted with normal full range of motion of all joints.   Neurologic:  strength normal in all extremities, sensation intact to light touch, gait normal, and DTRs symmetrical and normal.   Skin:  Intact without suspicious lesions or rashes Cervical Nodes:  No lymphadenopathy noted Inguinal Nodes:  No significant adenopathy Psych:  normal affect, talkative and pleasant    Impression & Recommendations:  Problem # 1:  HEALTH MAINTENANCE EXAM (ICD-V70.0) Assessment Comment Only reviewed health habits including diet, exercise and skin cancer prevention reviewed health maintenance list and family history rev labs in detail today  Problem # 2:  ESSENTIAL HYPERTENSION (ICD-401.9) Assessment: Deteriorated  bp is up today- poss from stress and ibuprofen and no exercise for 2 wk will get back to regular routine and cut down the ibuprofen nurse visit for bp 2 wk- if still up will need to adj tx refilled med disc healthy diet (low simple sugar/ choose complex carbs/ low sat fat) diet and exercise in detail  Her updated medication list for this problem includes:    Norvasc 5 Mg Tabs (Amlodipine besylate) .Marland Kitchen... Take one by mouth daily  BP today: 154/82 Prior BP: 138/76 (05/30/2009)  Labs Reviewed: K+: 4.2 (08/22/2009) Creat: : 0.7 (08/22/2009)   Chol: 189 (08/22/2009)   HDL: 56.00 (08/22/2009)   LDL: 119 (08/22/2009)   TG: 69.0 (08/22/2009)  Problem # 3:  HYPERCHOLESTEROLEMIA (ICD-272.0) Assessment: Unchanged  good control with diet --better HDL rev low sat fat diet and enc further good work rev lab with pt today  Labs Reviewed: SGOT: 26 (08/22/2009)   SGPT: 28 (08/22/2009)   HDL:56.00 (08/22/2009), 46.30 (07/22/2008)  LDL:119 (08/22/2009), 113 (07/22/2008)  Chol:189 (08/22/2009), 177 (07/22/2008)  Trig:69.0 (08/22/2009), 90.0 (07/22/2008)  Problem # 4:  Hx of THYROID DISEASE, HX OF  (ICD-V12.2) Assessment: Unchanged no change in tsh - no change clinically will continue current synthroid dose  rev lab today  Problem # 5:  OSTEOPENIA (ICD-733.90) Assessment: Unchanged dexa in 2010 - mild osteopenia watcing D level which is down- due to not taking it  adv to get 2000 international units vit D daily (also her ca and exercise) re check next year both dexa and D  Problem # 6:  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 Take one by mouth daily 2)  Valtrex 500 Mg Tabs (Valacyclovir hcl) .... Take one by mouth daily as needed for break out 3)  Synthroid 125 Mcg Tabs (Levothyroxine sodium) .... Take one by mouth daily 4)  Glucosamine Complex Tabs (  Glucosamine hcl-glucosamin so4 tabs) .... Take one by mouth daily 5)  Vitamin B-12 500 Mcg Tabs (Cyanocobalamin) .... Take one by mouth daily 6)  Relpax 40 Mg Tabs (Eletriptan hydrobromide) .... Take 1 tablet by mouth once a day as needed 7)  Vitamin E 400 Unit Caps (Vitamin e) .... Take one by mouth daily 8)  Multivitamins Tabs (Multiple vitamin) .... Take 1 tablet by mouth once a day 9)  Calcium Carbonate- 600  .... 2 tabs by mouth daily 10)  Celery Seed Tablets Otc  .Marland Kitchen.. 3 daily 11)  Promethazine Hcl 25 Mg Tabs (Promethazine hcl) .Marland Kitchen.. 1 by mouth up to three times a day as needed nausea 12)  Claritin 10 Mg Tabs (Loratadine) .Marland Kitchen.. 1 once daily as needed 13)  Meclizine Hcl 25 Mg Tabs (Meclizine hcl) .Marland Kitchen.. 1-2 by mouth up to three times a day as needed dizziness 14)  Mucinex 600 Mg Xr12h-tab (Guaifenesin) .... Otc as directed. 15)  Ibuprofen 800 Mg Tabs (Ibuprofen) .... Take one tablet every 8 hours as needed for pain- take  with food 16)  Voltaren 1 % Gel (Diclofenac sodium) .... As directed 17)  Zovirax 5 % Crea (Acyclovir) .... Apply to affected area 5 times daily as needed breakout  Patient Instructions: 1)  aim for 2000 international units vit D daily 2)  also take your calcium - for bone health  3)  please schedule appt with Dr Patsy Lager for leg and back pain at check out  4)  no change in medicines  5)  schedule nurse visit in 2 week for bp check  6)  if bp is still high - we may need to adjust medicine  Prescriptions: ZOVIRAX 5 % CREA (ACYCLOVIR) apply to affected area 5 times daily as needed breakout  #3 months x 3   Entered and Authorized by:   [name] MD   Signed by:   [name] MD on 08/25/2009   Method used:   Print then Give to Patient   RxID:   [id number] IBUPROFEN 800 MG TABS (IBUPROFEN) take one tablet every 8 hours as needed for pain- take with food  #60 x 1   Entered and Authorized by:   [name] MD   Signed by:   [name] MD on 08/25/2009   Method used:   Electronically to        [facility] [phone]* (retail)       [address] [place], [place]  [address]       Ph: [phone] or [phone]       Fax: [phone]   RxID:   [id number] SYNTHROID 125 MCG  TABS (LEVOTHYROXINE SODIUM) take one by mouth daily  #90 x 3   Entered and Authorized by:   [name] MD   Signed by:   [name] MD on 08/25/2009   Method used:   Print then Give to Patient   RxID:   [id number] SYNTHROID 125 MCG  TABS (LEVOTHYROXINE SODIUM) take one by mouth daily  #15 x 0   Entered and Authorized by:   [name] MD   Signed by:   [name] MD on 08/25/2009   Method used:   Electronically to        [facility] [phone]* (retail)       [address] [place]  [place], [place]  [address]       Ph: [phone] or [phone]       Fax: [phone]   RxID:   [id number] VALTREX 500 MG  TABS  (VALACYCLOVIR HCL) take one by mouth daily as needed for break out Brand medically necessary #60 x 3   Entered and Authorized by:   [name] MD   Signed by:   [name] MD on 08/25/2009   Method used:   Print then Give to Patient   RxID:   [id number] NORVASC 5 MG  TABS (AMLODIPINE BESYLATE) take one by mouth daily  #90 x 3   Entered and Authorized by:   [name] MD   Signed by:   [name] MD on 08/25/2009   Method used:   Print then Give to Patient   RxID:   [id number]   Current Allergies (reviewed today): CEFTIN * ZYRTEC * ALLEGRA PREDNISONE BETA BLOCKERS * EGGS * ANTIHISTAMINES

## 2010-05-16 NOTE — Assessment & Plan Note (Signed)
Summary: BAD COLD  CYD   Vital Signs:  Patient profile:   65 year old female Height:      63 inches Weight:      175 pounds BMI:     31.11 Temp:     98.9 degrees F oral Pulse rate:   64 / minute Pulse rhythm:   regular BP sitting:   138 / 76  (left arm) Cuff size:   regular  Vitals Entered By: Lewanda Rife LPN (May 30, 2009 12:41 PM)  History of Present Illness: started with cold symptoms on wednesday- sneezing  then runny nose and stuffy nose followed by cough severe cough on thursday started on mucinex fever low grade- started on ibuprofen 800 (that she had)  friday -- croupy harsh cough - and go delsym for cough-- did not help at all  rock n rye did help in a pinch  slept all day fri and saturday  then found some ry tanna  -- and those did help some   has been using an inscentive spirometer left over from surgery   is a bit better today  yellow phlegm  Allergies: 1)  Ceftin 2)  * Zyrtec 3)  * Allegra 4)  Claritin 5)  Prednisone 6)  Beta Blockers 7)  * Eggs 8)  * Antihistamines  Past History:  Past Medical History: Last updated: 12/18/2007 Allergic rhinitis Osteopenia HTN HSV  Past Surgical History: Last updated: 07-19-07 Appendectomy Hysterectomy- for bleeding and ov cyst Tonsillectomy Dexa- normal (2000) Bone marrow biopsy (1980's) EGD- biopsy, benign (04/2001). CT sinuses- sinusitis (05/2001) NCV- neg (01/2004) Dexa- normal (04/2004) Colonoscopy- hemorrhoids (04/2001) Dexa- osteopenia LS-.5, FN -1.0 (09/2006)  Family History: Last updated: 07/19/2007 Father: deceased-, smoker, ETOH, esoph cancer Mother: deceased- 5 vess CABG Siblings:  brother CAD sister RA   Social History: Last updated: 07/19/07 Marital Status: Married Children: 1 son Occupation: waitress quit smoking (brief smoking in past)- over 20 years ago  Risk Factors: Smoking Status: quit (10/21/2006)  Review of Systems General:  Complains of chills, fatigue,  fever, loss of appetite, and malaise. Eyes:  Denies blurring and eye irritation. ENT:  Complains of earache, hoarseness, nasal congestion, postnasal drainage, sinus pressure, and sore throat. CV:  Denies chest pain or discomfort, lightheadness, and palpitations. Resp:  Complains of cough and sputum productive; denies pleuritic, shortness of breath, and wheezing. GI:  Denies abdominal pain, bloody stools, and change in bowel habits. Derm:  Denies rash.  Physical Exam  General:  Well-developed,well-nourished,in no acute distress; alert,appropriate and cooperative throughout examination Head:  normocephalic, atraumatic, and no abnormalities observed.  no sinus tenderness Eyes:  vision grossly intact, pupils equal, pupils round, pupils reactive to light, and no injection.   Ears:  R ear normal and L ear normal.  --overall dull  Nose:  nares are congested and injected - worse in R Mouth:  pharynx pink and moist, no erythema, and no exudates.   Neck:  No deformities, masses, or tenderness noted. Lungs:  CTA with harsh bs at bases and occ rhonchi  no rales  scant wheeze on forced exp only  Heart:  Normal rate and regular rhythm. S1 and S2 normal without gallop, murmur, click, rub or other extra sounds. Skin:  Intact without suspicious lesions or rashes Cervical Nodes:  No lymphadenopathy noted Psych:  normal affect, talkative and pleasant    Impression & Recommendations:  Problem # 1:  BRONCHITIS- ACUTE (ICD-466.0) Assessment New with persistant cough and mild reactive airways - worsening after  uri will cover with zpak and update  tussionex with caution for severe cough expectorant as needed pt advised to update me if symptoms worsen or do not improve - esp if wheezing or fever  The following medications were removed from the medication list:    Augmentin 875-125 Mg Tabs (Amoxicillin-pot clavulanate) .Marland Kitchen... 1 by mouth two times a day for 10 days Her updated medication list for this  problem includes:    Mucinex 600 Mg Xr12h-tab (Guaifenesin) ..... Otc as directed.    R-tanna 9-25 Mg Tabs (Chlorphen tan-phenyleph tan) .Marland Kitchen... Take one to two tabs by mouth every twelve hours as need for cough and congestion    Zithromax Z-pak 250 Mg Tabs (Azithromycin) .Marland Kitchen... Take by mouth as directed    Tussionex Pennkinetic Er 8-10 Mg/45ml Lqcr (Chlorpheniramine-hydrocodone) .Marland Kitchen... 1/2-1 teaspoon by mouth up to every 12 hours as needed severe cough  Complete Medication List: 1)  Norvasc 5 Mg Tabs (Amlodipine besylate) .... Take one by mouth daily 2)  Valtrex 500 Mg Tabs (Valacyclovir hcl) .... Take one by mouth daily prn 3)  Synthroid 125 Mcg Tabs (Levothyroxine sodium) .... Take one by mouth daily 4)  Glucosamine Complex Tabs (Glucosamine hcl-glucosamin so4 tabs) .... Take one by mouth daily 5)  Vitamin B-12 500 Mcg Tabs (Cyanocobalamin) .... Take one by mouth daily 6)  Relpax 40 Mg Tabs (Eletriptan hydrobromide) .... Take 1 tablet by mouth once a day as needed 7)  Vitamin E 400 Unit Caps (Vitamin e) .... Take one by mouth daily 8)  Multivitamins Tabs (Multiple vitamin) .... Take 1 tablet by mouth once a day 9)  Calcium Carbonate- 600  .... 2 tabs by mouth daily 10)  Celery Seed Tablets Otc  .Marland Kitchen.. 3 daily 11)  Promethazine Hcl 25 Mg Tabs (Promethazine hcl) .Marland Kitchen.. 1 by mouth up to three times a day as needed nausea 12)  Flexeril 10 Mg Tabs (Cyclobenzaprine hcl) .... 1/2 to 1 by mouth up to three times a day as needed back pain/spasm 13)  Claritin 10 Mg Tabs (Loratadine) .Marland Kitchen.. 1 once daily as needed 14)  Meclizine Hcl 25 Mg Tabs (Meclizine hcl) .Marland Kitchen.. 1-2 by mouth up to three times a day as needed dizziness 15)  Mucinex 600 Mg Xr12h-tab (Guaifenesin) .... Otc as directed. 16)  Ibuprofen 800 Mg Tabs (Ibuprofen) .... Take one tablet every 8 hours as needed 17)  R-tanna 9-25 Mg Tabs (Chlorphen tan-phenyleph tan) .... Take one to two tabs by mouth every twelve hours as need for cough and congestion 18)   Zithromax Z-pak 250 Mg Tabs (Azithromycin) .... Take by mouth as directed 19)  Tussionex Pennkinetic Er 8-10 Mg/8ml Lqcr (Chlorpheniramine-hydrocodone) .... 1/2-1 teaspoon by mouth up to every 12 hours as needed severe cough  Patient Instructions: 1)  you can try mucinex over the counter twice daily as directed and nasal saline spray for congestion 2)  tylenol over the counter as directed may help with aches, headache and fever 3)  call if symptoms worsen or if not improved in 4-5 days  4)  take zithromax as directed for bronchitis  5)  tussionex as needed - use with caution because of sedation  Prescriptions: TUSSIONEX PENNKINETIC ER 8-10 MG/5ML LQCR (CHLORPHENIRAMINE-HYDROCODONE) 1/2-1 teaspoon by mouth up to every 12 hours as needed severe cough  #8 oz x 0   Entered and Authorized by:   [name] MD   Signed by:   [name] MD on 05/30/2009   Method used:  Print then Give to Patient   RxID:   [id number] ZITHROMAX Z-PAK 250 MG TABS (AZITHROMYCIN) take by mouth as directed  #1 pack x 0   Entered and Authorized by:   [name] MD   Signed by:   [name] MD on 05/30/2009   Method used:   Print then Give to Patient   RxID:   [id number]   Current Allergies (reviewed today): CEFTIN * ZYRTEC * ALLEGRA CLARITIN PREDNISONE BETA BLOCKERS * EGGS * ANTIHISTAMINES

## 2010-05-16 NOTE — Letter (Signed)
Summary: Results Follow up Letter  [place] at [facility]  [address] [place], [place] [address]   Phone: [phone]  Fax: [phone]    07/05/2009 MRN: [id number]    [facility] [address] [place], [place]  [address]    Dear [name],  The following are the results of your recent test(s):  Test         Result    Pap Smear:        Normal _____  Not Normal _____ Comments: ______________________________________________________ Cholesterol: LDL(Bad cholesterol):         Your goal is less than:         HDL (Good cholesterol):       Your goal is more than: Comments:  ______________________________________________________ Mammogram:        Normal __X___  Not Normal _____ Comments:  Yearly follow up is recommended.   ___________________________________________________________________ Hemoccult:        Normal _____  Not normal _______ Comments:    _____________________________________________________________________ Other Tests:    We routinely do not discuss normal results over the telephone.  If you desire a copy of the results, or you have any questions about this information we can discuss them at your next office visit.   Sincerely,    Meghan Dixon, M.D.  MAT:lsf

## 2010-05-16 NOTE — Progress Notes (Signed)
Summary: pt requesting labs  Phone Note Call from Patient Call back at Home Phone [phone]   Caller: Patient Call For: [name] MD Summary of Call: Pt has physical scheduled for next week and she would like to come in this week for labs.  Please order. Initial call taken by: Lowella Petties CMA,  Aug 16, 2009 8:33 AM  Follow-up for Phone Call        please check fasting wellness/ lipid/ vit D for v70.0, 272, and 733.0- thanks  Follow-up by: [name] MD,  Aug 16, 2009 8:53 AM  Additional Follow-up for Phone Call Additional follow up Details #1::        Fasting lab appointment scheduled as instructed 08/18/09 at 8:50am.Patient notified as instructed by telephone. Lewanda Rife LPN  Aug 17, 5730 12:17 PM

## 2010-05-25 ENCOUNTER — Telehealth: Payer: Self-pay | Admitting: Family Medicine

## 2010-05-26 ENCOUNTER — Telehealth: Payer: Self-pay | Admitting: Family Medicine

## 2010-06-01 NOTE — Progress Notes (Signed)
Summary: pt called with report  Phone Note Call from Patient Call back at Home Phone [phone]   Caller: Patient Call For: [name] MD Summary of Call: Pt called to report that her nausea is under control but the diarrhea is worse.  Everything goes through her.  No pain or cramping, no fever.  Continues on a clear liquid diet, but feels very drained.         Lowella Petties CMA, AAMA  May 26, 2010 11:42 AM  Initial call taken by: Lowella Petties CMA, AAMA,  May 26, 2010 11:42 AM  Follow-up for Phone Call        keep up with fluids to avoid dehydration - glad she is no longer vomiting  call sat clinic if worse tomorrow or abd pain or other symptoms  this is really going around -- try to have someone clean surfaces with bleach solution or lysol to prevent transmission to family  Follow-up by: [name] MD,  May 26, 2010 1:31 PM  Additional Follow-up for Phone Call Additional follow up Details #1::        Patient notified as instructed by telephone. Pt said her head was hurting also. Dr. Milinda Antis said can be related to virus but more likely due to dehydration so fluids, fluids, fluids. Pt took [facility] # in case she needs to go in AM. Lewanda Rife LPN  May 26, 2010 3:52 PM

## 2010-06-01 NOTE — Progress Notes (Signed)
Summary: vomiting and diarrhea  Phone Note Call from Patient Call back at Home Phone [phone]   Caller: Meghan Dixon Call For: Meghan Part MD Summary of Call: Pt has vomiting and diarrhea since yesterday.  Low grade fever, headache.  She is asking for something to be [facility] to [facility] [address]  She wants zofran, dissolvable tabs. Advised her husband of small frequent sips of clear fluids, ice chips, no solid foods, no dairy.  Please call her and let her know. Initial call taken by: Lowella Petties CMA, AAMA,  May 25, 2010 9:36 AM  Follow-up for Phone Call        I need to know if she has any abdominal pain was she specifically exposed to someone with this?  let me know Follow-up by: Meghan Part MD,  May 25, 2010 12:48 PM  Additional Follow-up for Phone Call Additional follow up Details #1::        Patient notified as instructed by telephone. Pt said she does not have abdominal pain. and on Mon she was with her 57 yr old granddaughter who had diarrhea and vomiting. Please advise. Lewanda Rife LPN  May 25, 2010 4:41 PM     Additional Follow-up for Phone Call Additional follow up Details #2::    ok- will do the zofran- but if worse or if signs of dehydration (sunken eyes/ dry mouth/ rapid heartbeat) - please alert me and go to ER please update me with how she is feeling I suspect possible norovirus  Follow-up by: Meghan Part MD,  May 25, 2010 4:44 PM  Additional Follow-up for Phone Call Additional follow up Details #3:: Details for Additional Follow-up Action Taken: Patient notified as instructed by telephone. Pt said she does feel better now than she did this AM. Pt has not vomited since this AM ( was not sure of time) and diarrhea is not as often.Pt will call back on Fri to give update of her condition. Medication phoned to [facility] as instructed. Lewanda Rife LPN  May 25, 2010 4:56 PM   New/Updated Medications: ZOFRAN ODT 4 MG TBDP  (ONDANSETRON) 1 by mouth two times a day as needed nausea Prescriptions: ZOFRAN ODT 4 MG TBDP (ONDANSETRON) 1 by mouth two times a day as needed nausea  #6 x 0   Entered and Authorized by:   Meghan Part MD   Signed by:   Meghan Part MD on 05/25/2010   Method used:   Telephoned to ...       [facility] [phone]* (retail)       [address] [place], [place]  [address]       Ph: [phone] or [phone]       Fax: [phone]   RxID:   [id number]   Appended Document: vomiting and diarrhea I signed this note instead of forwarding to Dr Milinda Antis to let her know how pt is doing now so I am sending to Dr Milinda Antis as an apend.

## 2010-06-14 ENCOUNTER — Encounter: Payer: Self-pay | Admitting: Family Medicine

## 2010-06-27 NOTE — Letter (Signed)
Summary: [facility]   Imported By: Kassie Mends 06/19/2010 10:47:47  _____________________________________________________________________  External Attachment:    Type:   Image     Comment:   External Document

## 2010-07-05 ENCOUNTER — Other Ambulatory Visit: Payer: Self-pay | Admitting: Family Medicine

## 2010-07-05 DIAGNOSIS — Z1231 Encounter for screening mammogram for malignant neoplasm of breast: Secondary | ICD-10-CM

## 2010-08-12 ENCOUNTER — Encounter: Payer: Self-pay | Admitting: Family Medicine

## 2010-08-22 ENCOUNTER — Telehealth: Payer: Self-pay | Admitting: Family Medicine

## 2010-08-22 DIAGNOSIS — Z0000 Encounter for general adult medical examination without abnormal findings: Secondary | ICD-10-CM | POA: Insufficient documentation

## 2010-08-22 DIAGNOSIS — E7800 Pure hypercholesterolemia, unspecified: Secondary | ICD-10-CM

## 2010-08-22 DIAGNOSIS — M899 Disorder of bone, unspecified: Secondary | ICD-10-CM

## 2010-08-22 NOTE — Telephone Encounter (Signed)
Message copied by Shanik Brookshire on Tue Aug 22, 2010  9:12 PM ------      Message from: CHAVERS, NATASHA      Created: Tue Aug 22, 2010 11:25 AM      Regarding: Cpx labs thurs       Please order  future cpx labs for pt's upcomming lab appt.      Thanks      Tasha       

## 2010-08-22 NOTE — Telephone Encounter (Signed)
Message copied by Roxy Manns on Tue Aug 22, 2010  9:12 PM ------      Message from: Liane Comber      Created: Tue Aug 22, 2010 11:25 AM      Regarding: Cpx labs thurs       Please order  future cpx labs for pt's upcomming lab appt.      Thanks      Rodney Booze

## 2010-08-24 ENCOUNTER — Other Ambulatory Visit (INDEPENDENT_AMBULATORY_CARE_PROVIDER_SITE_OTHER): Payer: 59

## 2010-08-24 DIAGNOSIS — E7800 Pure hypercholesterolemia, unspecified: Secondary | ICD-10-CM

## 2010-08-24 DIAGNOSIS — Z0000 Encounter for general adult medical examination without abnormal findings: Secondary | ICD-10-CM

## 2010-08-24 DIAGNOSIS — M899 Disorder of bone, unspecified: Secondary | ICD-10-CM

## 2010-08-24 DIAGNOSIS — Z1231 Encounter for screening mammogram for malignant neoplasm of breast: Secondary | ICD-10-CM

## 2010-08-24 LAB — TSH: TSH: 3.31 u[IU]/mL (ref 0.35–5.50)

## 2010-08-24 LAB — LIPID PANEL
Cholesterol: 200 mg/dL (ref 0–200)
Triglycerides: 50 mg/dL (ref 0.0–149.0)

## 2010-08-24 LAB — CBC WITH DIFFERENTIAL/PLATELET
Basophils Relative: 0.5 % (ref 0.0–3.0)
Eosinophils Absolute: 0.2 10*3/uL (ref 0.0–0.7)
Eosinophils Relative: 3.6 % (ref 0.0–5.0)
Hemoglobin: 13.2 g/dL (ref 12.0–15.0)
Lymphocytes Relative: 32.7 % (ref 12.0–46.0)
MCHC: 34.2 g/dL (ref 30.0–36.0)
Monocytes Relative: 9.2 % (ref 3.0–12.0)
Neutro Abs: 3.4 10*3/uL (ref 1.4–7.7)
RBC: 4.19 Mil/uL (ref 3.87–5.11)
WBC: 6.4 10*3/uL (ref 4.5–10.5)

## 2010-08-24 LAB — COMPREHENSIVE METABOLIC PANEL
Albumin: 3.8 g/dL (ref 3.5–5.2)
BUN: 14 mg/dL (ref 6–23)
CO2: 32 mEq/L (ref 19–32)
Calcium: 9.2 mg/dL (ref 8.4–10.5)
Chloride: 107 mEq/L (ref 96–112)
Creatinine, Ser: 0.8 mg/dL (ref 0.4–1.2)
GFR: 81.77 mL/min (ref >60.00)
Glucose, Bld: 96 mg/dL (ref 70–99)
Potassium: 4.1 mEq/L (ref 3.5–5.1)

## 2010-08-25 ENCOUNTER — Ambulatory Visit
Admission: RE | Admit: 2010-08-25 | Discharge: 2010-08-25 | Disposition: A | Discharge status: Home / Self Care | Payer: 59 | Source: Ambulatory Visit | Attending: Family Medicine | Admitting: Family Medicine

## 2010-08-29 ENCOUNTER — Ambulatory Visit (INDEPENDENT_AMBULATORY_CARE_PROVIDER_SITE_OTHER): Payer: 59 | Admitting: Family Medicine

## 2010-08-29 ENCOUNTER — Encounter: Payer: Self-pay | Admitting: Family Medicine

## 2010-08-29 DIAGNOSIS — E039 Hypothyroidism, unspecified: Secondary | ICD-10-CM | POA: Insufficient documentation

## 2010-08-29 DIAGNOSIS — M949 Disorder of cartilage, unspecified: Secondary | ICD-10-CM

## 2010-08-29 DIAGNOSIS — I10 Essential (primary) hypertension: Secondary | ICD-10-CM

## 2010-08-29 DIAGNOSIS — E7800 Pure hypercholesterolemia, unspecified: Secondary | ICD-10-CM

## 2010-08-29 DIAGNOSIS — Z23 Encounter for immunization: Secondary | ICD-10-CM

## 2010-08-29 DIAGNOSIS — Z0000 Encounter for general adult medical examination without abnormal findings: Secondary | ICD-10-CM

## 2010-08-29 MED ORDER — VALACYCLOVIR HCL 500 MG PO TABS: 500.0000 mg | ORAL_TABLET | Freq: Every day | ORAL | Status: DC | PRN

## 2010-08-29 MED ORDER — ELETRIPTAN HYDROBROMIDE 40 MG PO TABS: 40.0000 mg | ORAL_TABLET | ORAL | Status: DC | PRN

## 2010-08-29 MED ORDER — AMLODIPINE BESYLATE 5 MG PO TABS: 5.0000 mg | ORAL_TABLET | Freq: Every day | ORAL | Status: DC

## 2010-08-29 MED ORDER — LEVOTHYROXINE SODIUM 125 MCG PO TABS: 125.0000 ug | ORAL_TABLET | Freq: Every day | ORAL | Status: DC

## 2010-08-29 MED ORDER — ACYCLOVIR 5 % EX CREA: 1.0000 "application " | TOPICAL_CREAM | CUTANEOUS | Status: DC

## 2010-08-29 NOTE — Progress Notes (Signed)
Subjective:    Patient ID: Meghan Dixon, female    DOB: [birth year], 65 y.o.   MRN: [id number]  HPI Here for annual wellness exam and to review chronic med problems  Migraine and L chest pain   For 6-8 months some tingly burning across entire upper abdomen No rash Valtrex did not make a difference  Has a known pinched nerve in her back -- that may cause it   Never had shingles vaccine  Is interested and will check with ins - will get it if affordible  Has a mole on R abd - going to derm go to get it checked  Toenails on L foot got thick Is using fungus cream - not helping     Wt is down about 9 lbs with diet Working around house - stays active  Does her back exercises and lifts some weights   Td 02- due for one  colonosc 1/03 - normal -- will be due next year   Mam 5/12 - was normal  Self exam no lumps  Some discomfort -- on lat L breast -- after working in the yard Better last few days No skin change   Pap 4/10 Had tot hyst for bleeding and ov cyst One ovary remains  No gyn symptoms    dexa - osteopenia mild in 6/08 FN -1.0 T score Vit D is 38- improved Ca and vit D  HTN - bp is 138/80 today Did take migraine med recently (gets weather and allergy season)  No cp or edema Some skin tenderness on r side of face since her migraine  No vision change    Lipids are up slightly with good HDL  LDLis 126 up from one-teens  Diet Lab Results  Component Value Date   CHOL 200 08/24/2010   CHOL 189 08/22/2009   CHOL 177 07/22/2008   Lab Results  Component Value Date   HDL 64.10 08/24/2010   HDL 87.56 08/22/2009   HDL 46.30 07/22/2008   Lab Results  Component Value Date   LDLCALC 126* 08/24/2010   LDLCALC 119* 08/22/2009   LDLCALC 113* 07/22/2008   Lab Results  Component Value Date   TRIG 50.0 08/24/2010   TRIG 69.0 08/22/2009   TRIG 90.0 07/22/2008   Lab Results  Component Value Date   CHOLHDL 3 08/24/2010   CHOLHDL 3 08/22/2009   CHOLHDL 4 07/22/2008   Lab Results    Component Value Date   LDLDIRECT 118.5 06/05/2006   is staying away from sat fats -- but does eat eggs every day  Used to be all to eggs     Hypothyroid - tsh is theraputic Clinically-- feels like thyroid is stable   Past Medical History  Diagnosis Date  . Allergy     allergic rhinitis  . Osteopenia   . Hypertension   . HSV infection     History   Social History  . Marital Status: Married    Spouse Name: N/A    Number of Children: N/A  . Years of Education: N/A   Occupational History  . Not on file.   Social History Main Topics  . Smoking status: Former Smoker    Quit date: 04/16/1984  . Smokeless tobacco: Not on file  . Alcohol Use: Not on file  . Drug Use: Not on file  . Sexually Active: Not on file   Other Topics Concern  . Not on file   Social History Narrative  . No narrative on  file    Family History  Problem Relation Age of Onset  . Heart disease Mother   . Cancer Father     esophageal CA  . Alcohol abuse Father   . Arthritis Sister     RA  . Heart disease Brother     CAD    Past Surgical History  Procedure Date  . Abdominal hysterectomy     for bleeding and ovarian cyst  . Appendectomy   . Tonsillectomy     Allergies  Allergen Reactions  . Beta Adrenergic Blockers     REACTION: reaction not known  . Cefuroxime Axetil     REACTION: reaction not known  . Cetirizine Hcl     REACTION: dizzy  . Eggs Or Egg-Derived Products     REACTION: reaction not known  . Fexofenadine     REACTION: dizzy  . Prednisone     REACTION: reaction not known        Review of Systems Review of Systems  Constitutional: Negative for fever, appetite change, fatigue and unexpected weight change.  Eyes: Negative for pain and visual disturbance.  Respiratory: Negative for cough and shortness of breath.   Cardiovascular: Negative.for sob/ edema/ pos for chest wall pain    Gastrointestinal: Negative for nausea, diarrhea and constipation.  Genitourinary:  Negative for urgency and frequency.  Skin: Negative for pallor.and rash  Neurological: Negative for weakness, light-headedness, numbness and pos for headaches Hematological: Negative for adenopathy. Does not bruise/bleed easily.  Psychiatric/Behavioral: Negative for dysphoric mood. The patient is not nervous/anxious.         Objective:   Physical Exam  Constitutional: She appears well-developed and well-nourished. No distress.  HENT:  Head: Normocephalic and atraumatic.  Right Ear: External ear normal.  Left Ear: External ear normal.  Nose: Nose normal.  Mouth/Throat: Oropharynx is clear and moist.  Eyes: Conjunctivae and EOM are normal. Pupils are equal, round, and reactive to light.  Neck: Normal range of motion. Neck supple. Carotid bruit is not present. No tracheal deviation present. No thyromegaly present.  Cardiovascular: Normal rate, regular rhythm and normal heart sounds.   Pulmonary/Chest: Effort normal and breath sounds normal. No respiratory distress. She has no wheezes.  Abdominal: Soft. Bowel sounds are normal. She exhibits no distension, no abdominal bruit and no mass.  Genitourinary: No breast swelling, tenderness, discharge or bleeding.  Musculoskeletal: She exhibits no edema and no tenderness.  Lymphadenopathy:    She has no cervical adenopathy.  Neurological: She is alert. She has normal reflexes. No cranial nerve deficit. Coordination normal.  Skin: Skin is warm and dry. No rash noted. No erythema. No pallor.       Several toenails thickened L foot- no tenderness or change in color No signs of tinea on feet  Psychiatric: She has a normal mood and affect.          Assessment & Plan:

## 2010-08-29 NOTE — Assessment & Plan Note (Signed)
Reviewed health habits including diet and exercise and skin cancer prevention Also reviewed health mt list, fam hx and immunizations   Rev wellness labs  

## 2010-08-29 NOTE — Patient Instructions (Signed)
We will schedule bone density test at check out  Avoid egg yolks - eat the whites and egg beaters If you are interested in shingles vaccine in future - call your insurance company to see how coverage is and call us to schedule  Tdap vaccine today  Update me if facial tenderness/ headache does not improve in a week or if any rash  Keep working on healthy diet and exercise  Bring up the toenail problem to your dermatologist

## 2010-08-29 NOTE — Assessment & Plan Note (Signed)
bp up a bit due to migraine med Has been well controlled Disc lifestyle habits No change in med

## 2010-08-29 NOTE — Assessment & Plan Note (Signed)
Mild on ca and D D level is ok sched dexa Disc exercise

## 2010-08-29 NOTE — Assessment & Plan Note (Signed)
tsh is stable with no clinical changes Refilled med

## 2010-08-29 NOTE — Assessment & Plan Note (Signed)
Up just a bit Disc stopping egg yolks and eating just whites or egg beaters Exp imp with this Rev lab with pt  Disc low sat fat diet in general

## 2010-08-29 NOTE — Assessment & Plan Note (Signed)
[facility] OFFICE NOTE   Meghan Dixon, Meghan Dixon                       MRN:          [id number]  DATE:06/24/2007                            DOB:          1945-07-19    PRIMARY CARE PHYSICIAN:  Marne A. Tower, MD   REASON FOR CONSULTATION:  Evaluate patient with palpitations.   HISTORY OF PRESENT ILLNESS:  The patient is a lovely 65 year old white  female without prior cardiac history.  She has noticed for the last  couple of months palpitations.  She describes these as skipping.  She  notices them throughout the day.  She does not describe sustained tachy  palpitations.  She had no pre-syncope or syncope.  She notices them  mostly at night but she has also noticed them during the day.  She will  feel a flip in her heart.  She tried to come off of caffeine and did not  notice much of an improvement.  They are not associated with chest  discomfort, neck or arm discomfort.  They are not associated with  shortness of breath.  There has been no PND or orthopnea.   PAST MEDICAL HISTORY:  1. Allergic rhinitis.  2. Osteopenia.  3. Hypertension times a couple years.  4. Hypothyroidism (recent TSH suggest euthyroid).   PAST SURGICAL HISTORY:  1. Appendectomy.  2. Hysterectomy.  3. Tonsillectomy.   ALLERGIES AND INTOLERANCES:  BETA BLOCKERS, ANTIHISTAMINES, TUSSIONEX,  CEFTIN, ZYRTEC, ALLEGRA, CLARITIN, PREDNISONE.   MEDICATIONS:  1. Norvasc 5 mg daily.  2. Valtrex 500 mg daily p.r.n.  3. Synthroid 125 mcg daily.  4. Glucosamine.  5. Complex vitamin B12.  6. Relpax p.r.n.  7. Vitamin E.  8. Multivitamin.  9. Tylenol.  10.Advil.   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  She is married.  Patient  has one son.  She enjoys hunting and fishing with her husband.  She  smoked 20 years ago.   FAMILY HISTORY:  Is contributory for mother having coronary disease in  his 61s and dying after bypass surgery.   Brother had heart disease  diagnosed at 36.   REVIEW OF SYSTEMS:  As stated in the HPI.  Positive for occasional  headaches.  Negative for other systems.   PHYSICAL EXAMINATION:  The patient is in no distress.  Blood pressure  151/90, heart rate 71 and regular.  HEENT: Eyelids unremarkable. Pupils equal, round, and reactive to light.  Fundi not visualized. Oral mucosa is unremarkable.  NECK: No jugular venous distention at 45 degrees. Carotid upstroke brisk  and symmetrical. No bruits, no thyromegaly.  LYMPHATICS: No cervical, axillary or inguinal adenopathy.  LUNGS: Clear to auscultation bilaterally.  BACK: No costovertebral angle tenderness.  CHEST: Unremarkable.  HEART: PMI not displaced or sustained. S1, S2 within normal limits. No  S3. No S4. No clicks, rub or murmurs.  ABDOMEN: Flat, positive bowel sounds, normal in frequency and pitch. No  bruits. No rebound. No guarding. No midline pulsatile mass. No  hepatomegaly, splenomegaly.  SKIN: No rashes, no nodules.  EXTREMITIES: 2+ pulses throughout.  No edema, cyanosis or clubbing.  NEURO: Oriented to person, place and time. Cranial nerves II-XII grossly  intact. Motor grossly intact.   EKG: Sinus rhythm, axis within normal limits, intervals within normal  limits, no acute ST-T wave changes.   ASSESSMENT/PLAN:  1. Palpitations.  Patient has experience either PACs or PVCs.  These      are not particularly problematic.  At this point we discussed      options such as ignoring them, treat with an antiarrhythmic or a      calcium channel blocker such as Cardizem.  She would prefer just to      try to ignore them for now, but if they become more problematic we      might switch to Cardizem.  I am going to get an echocardiogram.  I      did review her TSH and it was okay.  She will let me know if she      has any worsening symptoms.  2. Weight. We discussed the need to lose weight with diet and      exercise.  3. Hypertension.  Blood pressure is slightly elevated.  However, she      says at home it is in the 130s.  I will not take any changes  and I      instructed her on keeping a random sample of blood pressures to      keep a diary and share with Dr. Milinda Antis.  4. Followup.  In a couple months and sooner if there is any      abnormality in her echocardiogram.     Rollene Rotunda, MD, [facility]  Electronically Signed    JH/MedQ  DD: 06/24/2007  DT: 06/24/2007  Job #: [id number]   cc:   Marne A. Milinda Antis, MD

## 2010-08-31 ENCOUNTER — Ambulatory Visit: Payer: Self-pay

## 2010-09-01 NOTE — Procedures (Signed)
[facility]  Patient:    Meghan Dixon, Meghan Dixon Visit Number: [id number] MRN: [id number]          Service Type: END Location: ENDO Attending Physician:  Nelda Marseille Dictated by:   Petra Kuba, M.D. Proc. Date: 04/29/01 Admit Date:  04/29/2001   CC:         Roxy Manns, M.D. [facility]   Procedure Report  PROCEDURE:  EGD with biopsy.  INDICATION:  A patient with guaiac-positivity anemia.  Lots of aspirin and Goody powders for his headaches.  Nondiagnostic colonoscopy.  Consent was signed after risks, benefits, methods and options were thoroughly discussed in the office.  ADDITIONAL MEDICATIONS:  2 mg Versed only.  DESCRIPTION OF PROCEDURE:  The video endoscope was inserted by direct vision. The esophagus was normal.  In the distal esophagus was a tiny hiatal hernia. The scope was inserted into the stomach and advanced into the antrum where a small antral polyp was seen; then advanced through a normal pylorus, into a normal duodenal bulb and around the celiac to a normal second portion of the duodenum.  A quick look at the ampulla was normal.  The scope was withdrawn back to the bulb, and a good look there was normal.  The scope was withdrawn back into the stomach in retroflex.  The cardia, fundus, annularis, lesser and greater curve were normal -- except for some minimal questionable atrophic gastritis.  The scope was straightened and with straight visualization of the stomach confirmed the above and ruled out any other abnormality.  The scope was then advanced to the antrum; multiple biopsies of the antrum polyp were obtained.  Air was suctioned.  The scope was removed.  Again, a good look at the esophagus on slow withdrawal was normal.  The scope was removed.  The patient tolerated the procedure well.  There was no obvious or immediate complication.  ENDOSCOPIC DIAGNOSES: 1. Tiny hiatal hernia. 2. Antral small polyp, biopsied. 3. 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 4. Otherwise normal EGD.  PLAN:  Await pathology.  Minimize aspirin and nonsteroidals.  One iron a day okay.  Follow up with me p.r.n. or in six to eight weeks to recheck CBC symptoms and guaiacs, and see if further workup and plans like a small bowel follow through or CT is needed. Dictated by:   Petra Kuba, M.D. Attending Physician:  Nelda Marseille DD:  04/29/01 TD:  04/29/01 Job: [id number]

## 2010-09-01 NOTE — Procedures (Signed)
[facility]  Patient:    Meghan Dixon, Meghan Dixon Visit Number: [id number] MRN: [id number]          Service Type: END Location: ENDO Attending Physician:  Nelda Marseille Dictated by:   Petra Kuba, M.D. Admit Date:  04/29/2001                             Procedure Report  PROCEDURE:  Colonoscopy.  INDICATION:  Anemia, guaiac positivity, due for colonic screening.  PROCEDURE:  Consent was signed after risks, benefits, methods, and options were thoroughly discussed in the office.  MEDICINES USED:  Demerol 100, Versed 8.  PROCEDURE:  Rectal inspection was pertinent for small external hemorrhoids. Digital exam was negative.  The pediatric video adjustable colonoscope was inserted and with some difficulty due to a tortuous colon was able to be advanced to the cecum. This did require rolling her on her back and various abdominal pressures.  The cecum was identified by the appendiceal orifice and the ileocecal valve.  No obvious abnormalities were seen on insertion.  The scope was inserted a short ways in the terminal ileum.  No blood was seen coming from above.  The GI was normal. The scope was slowly withdrawn.  Prep was adequate.  There was some liquid stool that required washing and suctioned. On slow withdrawal through the colon, no abnormalities were seen. When we fell back around the tortuous loop, we did try to readvance around the curves to decrease the chance of missing things.  However, on slow withdrawal, no diverticula, polyps, or signs of bleeding were seen as we slowly withdrew back to the rectum. Once back in the rectum, the scope was retroflexed, pertinent for some internal hemorrhoids, small.  The scope was straightened and readvanced a short ways up the sigmoid.  Air was suctioned and the scope removed.  The patient tolerated the procedure well.  There was no obvious immediate complication.  ENDOSCOPIC DIAGNOSES: 1. Internal and  external small hemorrhoids. 2. Tortuous colon. 3. Otherwise within normal limits to the terminal ileum without any    blood being seen.  PLAN:  Repeat screening in 5-10 years.  Continue workup with an EGD. Dictated by:   Petra Kuba, M.D. Attending Physician:  Nelda Marseille DD:  04/29/01 TD:  04/29/01 Job: [id number]

## 2010-09-01 NOTE — Procedures (Signed)
[facility]  Patient:    [name], Meghan Dixon Visit Number: [id number] MRN: [id number]          Service Type: END Location: ENDO Attending Physician:  Nelda Marseille Dictated by:   Petra Kuba, M.D. Proc. Date: 04/29/01 Admit Date:  04/29/2001   CC:         Roxy Manns, M.D. [facility]   Procedure Report  PROCEDURE:  Colonoscopy.  INDICATIONS FOR PROCEDURE:  Anemia, guaiac positivity due for colonic screening.  Consent was signed after risks, benefits, methods, and options were thoroughly discussed in the office.  MEDICINES USED:  Demerol 100, Versed 8.  DESCRIPTION OF PROCEDURE:  Rectal inspection was pertinent for small external hemorrhoids. Digital exam was negative. The pediatric video adjustable colonoscope was inserted and with some difficulty due to a tortuous colon was able to be advanced to the cecum. This did require rolling her on her back and various abdominal pressures. The cecum was identified by the appendiceal orifice and the ileocecal valve. No obvious abnormalities were seen on insertion. The scope was inserted a short ways into the terminal ileum. No blood was seen coming from above. The TI was normal, the scope was slowly withdrawn. The prep was adequate. There was some liquid stool that required washing and suctioning. On slow withdrawal through the colon no abnormalities were seen. When we fell back around the tortuous loop, we did try to readvance around the curves and decrease the chance of missing things. However, on slow withdrawal no diverticula, polyps or signs of bleeding were seen as we slowly withdrew back to the rectum. Once back in the rectum, the scope was retroflexed pertinent for some internal hemorrhoids, small. The scope was straightened and readvanced a short ways up the sigmoid, air was suctioned, the scope removed. The patient tolerated the procedure well and there was on obvious or immediate  complication.  ENDOSCOPIC DIAGNOSIS:  1. Internal/external small hemorrhoids.  2. Tortuous colon.  3. Otherwise within normal limits the terminal ileum without any blood     being seen.  PLAN:  Repeat screening in 5-10 years, continue workup with an EGD. Dictated by:   Petra Kuba, M.D. Attending Physician:  Nelda Marseille DD:  04/29/01 TD:  04/30/01 Job: [phone] [id number]

## 2010-09-05 ENCOUNTER — Encounter: Payer: Self-pay | Admitting: *Deleted

## 2010-09-07 ENCOUNTER — Other Ambulatory Visit: Payer: Self-pay | Admitting: *Deleted

## 2010-09-07 MED ORDER — LEVOTHYROXINE SODIUM 125 MCG PO TABS: 125.0000 ug | ORAL_TABLET | Freq: Every day | ORAL | Status: DC

## 2010-09-07 NOTE — Telephone Encounter (Signed)
Script called to pharmacy

## 2010-09-18 ENCOUNTER — Encounter: Payer: Self-pay | Admitting: Family Medicine

## 2010-09-18 ENCOUNTER — Ambulatory Visit (INDEPENDENT_AMBULATORY_CARE_PROVIDER_SITE_OTHER): Payer: 59 | Admitting: Family Medicine

## 2010-09-18 VITALS — BP 126/70 | HR 70 | Temp 98.4°F | Wt 170.0 lb

## 2010-09-18 DIAGNOSIS — J3489 Other specified disorders of nose and nasal sinuses: Secondary | ICD-10-CM

## 2010-09-18 DIAGNOSIS — R0981 Nasal congestion: Secondary | ICD-10-CM

## 2010-09-18 MED ORDER — FLUTICASONE PROPIONATE 50 MCG/ACT NA SUSP: 2.0000 | Freq: Every day | NASAL | Status: DC

## 2010-09-18 NOTE — Patient Instructions (Signed)
Sounds like sinus congestion. Try simple mucinex. Nasal saline irrigation and start flonase 2 sprays in each nostril daily. Push fluids and rest. Update [place] if any worsening or fevers or not improving as expected after several days.

## 2010-09-18 NOTE — Progress Notes (Signed)
  Subjective:    Patient ID: Meghan Dixon, female    DOB: [birth year], 65 y.o.   MRN: [id number]  HPI CC: ST, earache and neck pain  5d h/o ear pain, neck pain, sore throat.  Still getting jabbing pains in ears and sinuses (pressure headache).  + drainage down throat.  Today actually feeling better.  Yesterday really bad.  Has tried goody powders.  No tooth pain.  Mild cough from PNDrainage.  No fevers/chills, abd pain, n/v/d, rashes.  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  No congestion.  Planning on leaving town soon.  No sick contacts at home, no smokers at home, no h/o asthma.  + seasonal allergies but better.  Review of Systems Per HPI    Objective:   Physical Exam  Nursing note and vitals reviewed. Constitutional: She appears well-developed and well-nourished. No distress.  HENT:  Head: Normocephalic and atraumatic.  Right Ear: Hearing, tympanic membrane, external ear and ear canal normal.  Left Ear: Hearing, external ear and ear canal normal.  Nose: Mucosal edema present. No rhinorrhea. Right sinus exhibits no maxillary sinus tenderness and no frontal sinus tenderness. Left sinus exhibits no maxillary sinus tenderness and no frontal sinus tenderness.  Mouth/Throat: Uvula is midline, oropharynx is clear and moist and mucous membranes are normal. No oropharyngeal exudate.  Eyes: Conjunctivae and EOM are normal. Pupils are equal, round, and reactive to light. No scleral icterus.  Neck: Normal range of motion. Neck supple. No thyromegaly present.  Cardiovascular: Normal rate, regular rhythm, normal heart sounds and intact distal pulses.   No murmur heard. Pulmonary/Chest: Effort normal and breath sounds normal. No respiratory distress. She has no wheezes. She has no rales.  Lymphadenopathy:    She has no cervical adenopathy.  Skin: Skin is warm and dry. No rash noted.          Assessment & Plan:

## 2010-09-18 NOTE — Assessment & Plan Note (Addendum)
Start INS.  Nasal saline.  Mucinex. veramyst sample provided.  States has tolerated INS in past despite prednisone allergy. No evidence of infection currently. Update [place] if not improving.   Could be early sinusitis.

## 2010-09-29 ENCOUNTER — Ambulatory Visit
Admission: RE | Admit: 2010-09-29 | Discharge: 2010-09-29 | Disposition: A | Discharge status: Home / Self Care | Payer: 59 | Source: Ambulatory Visit | Attending: Family Medicine | Admitting: Family Medicine

## 2010-09-29 DIAGNOSIS — M899 Disorder of bone, unspecified: Secondary | ICD-10-CM

## 2010-09-29 LAB — HM DEXA SCAN

## 2010-10-28 ENCOUNTER — Telehealth: Payer: Self-pay

## 2010-10-28 NOTE — Telephone Encounter (Signed)
Message copied by Patience Musca on Sat Oct 28, 2010 11:42 AM ------      Message from: Roxy Manns A      Created: Sun Oct 01, 2010  8:19 PM       dexa shows slight osteopenia (bone loss)- since last dexa      Please continue ca and vit D - and exercise as tolerated      Will re check in 2 years      Please note for health mt list

## 2010-10-28 NOTE — Telephone Encounter (Signed)
Letter mailed to patient as instructed. Health maintenance updated as instructed.

## 2010-12-15 ENCOUNTER — Ambulatory Visit (INDEPENDENT_AMBULATORY_CARE_PROVIDER_SITE_OTHER): Payer: 59 | Admitting: Family Medicine

## 2010-12-15 ENCOUNTER — Encounter: Payer: Self-pay | Admitting: Family Medicine

## 2010-12-15 VITALS — BP 148/80 | HR 68 | Temp 98.3°F | Ht 64.0 in | Wt 175.2 lb

## 2010-12-15 DIAGNOSIS — Z2911 Encounter for prophylactic immunotherapy for respiratory syncytial virus (RSV): Secondary | ICD-10-CM

## 2010-12-15 DIAGNOSIS — R221 Localized swelling, mass and lump, neck: Secondary | ICD-10-CM

## 2010-12-15 DIAGNOSIS — R220 Localized swelling, mass and lump, head: Secondary | ICD-10-CM

## 2010-12-15 DIAGNOSIS — Z23 Encounter for immunization: Secondary | ICD-10-CM

## 2010-12-15 NOTE — Patient Instructions (Signed)
Zoster vaccine today  We will do referral to ENT for evaluation of lump in neck  Update me if you have any problems - or symptoms such as fever

## 2010-12-15 NOTE — Assessment & Plan Note (Signed)
1.5 cm area - smooth and mobile under R mandible - is non tender - suspicious for LN or submandibular salivary gland  No other adenopthy Nl cbc in may  Does not appear to be near or part of thyroid  Ref to her ENT (Dr Annalee Genta) for further eval

## 2010-12-15 NOTE — Progress Notes (Signed)
Subjective:    Patient ID: Meghan Dixon, female    DOB: Oct 08, 1945, 65 y.o.   MRN: [id number]  HPI Here for a lump in R neck and also to get her zoster vaccine  Thought it was a swollen gland- has never went away Has been a few months  Now seems more prominent  Not tender No problems swallowing   Last tsh was fine 4 mo ago Lab Results  Component Value Date   TSH 3.31 08/24/2010   no clinical changes  Also wants zostavax today  Checked with her insurance and it does cover   Patient Active Problem List  Diagnoses  . HSV  . HYPERCHOLESTEROLEMIA  . TENSION HEADACHE  . MIGRAINE HEADACHE  . SYNDROME, CARPAL TUNNEL  . BENIGN POSITIONAL VERTIGO  . ESSENTIAL HYPERTENSION  . ALLERGIC RHINITIS  . FIBROCYSTIC BREAST DISEASE  . BACK PAIN, LUMBAR  . SCIATICA, LEFT  . TROCHANTERIC BURSITIS, LEFT  . ILIOTIBIAL BAND SYNDROME, LEFT KNEE  . LEG PAIN, LEFT  . OSTEOPENIA  . Routine general medical examination at a health care facility  . Hypothyroid  . Sinus congestion  . Swelling, mass, or lump in head and neck   Past Medical History  Diagnosis Date  . Allergy     allergic rhinitis  . Osteopenia   . Hypertension   . HSV infection    Past Surgical History  Procedure Date  . Abdominal hysterectomy     for bleeding and ovarian cyst  . Appendectomy   . Tonsillectomy    History  Substance Use Topics  . Smoking status: Former Smoker    Quit date: 04/16/1984  . Smokeless tobacco: Not on file  . Alcohol Use: Not on file   Family History  Problem Relation Age of Onset  . Heart disease Mother   . Cancer Father     esophageal CA  . Alcohol abuse Father   . Arthritis Sister     RA  . Heart disease Brother     CAD   Allergies  Allergen Reactions  . Beta Adrenergic Blockers     REACTION: reaction not known  . Cefuroxime Axetil     REACTION: reaction not known  . Cetirizine Hcl     REACTION: dizzy  . Eggs Or Egg-Derived Products     REACTION: reaction not known  .  Fexofenadine     REACTION: dizzy  . Flonase (Fluticasone Propionate) Other (See Comments)    dizziness  . Prednisone     REACTION: reaction not known   Current Outpatient Prescriptions on File Prior to Visit  Medication Sig Dispense Refill  . amLODipine (NORVASC) 5 MG tablet Take 1 tablet (5 mg total) by mouth daily.  90 tablet  3  . calcium carbonate (OS-CAL) 600 MG TABS Take 1,200 mg by mouth daily.        Dewayne Shorter Seed OIL Take 3 tablets by mouth daily.        . Cyanocobalamin (VITAMIN B 12 PO) Take 500 mcg by mouth daily.        Marland Kitchen eletriptan (RELPAX) 40 MG tablet One tablet by mouth as needed for migraine headache.  If the headache improves and then returns, dose may be repeated after 2 hours have elapsed since first dose (do not exceed 80 mg per day). 1 tablet by mouth once a day as needed.  27 tablet  3  . levothyroxine (SYNTHROID, LEVOTHROID) 125 MCG tablet Take 1 tablet (125 mcg total) by  mouth daily.  30 tablet  0  . Multiple Vitamin (MULTIVITAMIN) capsule Take 1 capsule by mouth daily.        . Nutritional Supplements (GLUCOSAMINE COMPLEX PO) Take 2 tablets by mouth daily.       . vitamin E 400 UNIT capsule Take 400 Units by mouth daily.        Marland Kitchen acyclovir (ZOVIRAX) 5 % cream Apply 1 application topically every 3 (three) hours. Apply to affected area 5 times daily as needed for breakout.  15 g  3  . fluticasone (FLONASE) 50 MCG/ACT nasal spray Place 2 sprays into the nose daily.  16 g  3  . loratadine (CLARITIN) 10 MG tablet Take 10 mg by mouth daily as needed.        . meclizine (ANTIVERT) 25 MG tablet Take 25-50 mg by mouth 3 (three) times daily as needed.        . ondansetron (ZOFRAN ODT) 4 MG disintegrating tablet Take 4 mg by mouth every 12 (twelve) hours as needed.        . promethazine (PHENERGAN) 25 MG tablet Take 25 mg by mouth every 8 (eight) hours as needed.        Marland Kitchen tiZANidine (ZANAFLEX) 4 MG capsule Take 4 mg by mouth at bedtime.        . valACYclovir (VALTREX) 500 MG  tablet Take 1 tablet (500 mg total) by mouth daily as needed. for break out  45 tablet  3       Review of Systems Review of Systems  Constitutional: Negative for fever, appetite change, fatigue and unexpected weight change.  Eyes: Negative for pain and visual disturbance.  ENT no congestion / sore throat or ear pain  Respiratory: Negative for cough and shortness of breath.   Cardiovascular: Negative.  for cp or palpitations  Gastrointestinal: Negative for nausea, diarrhea and constipation.  Genitourinary: Negative for urgency and frequency.  Skin: Negative for pallor. or rash  Neurological: Negative for weakness, light-headedness, numbness and headaches.  Hematological: Negative for adenopathy. Does not bruise/bleed easily.  Psychiatric/Behavioral: Negative for dysphoric mood. The patient is not nervous/anxious.          Objective:   Physical Exam  Constitutional: She appears well-developed and well-nourished.  HENT:  Head: Normocephalic and atraumatic.  Right Ear: External ear normal.  Left Ear: External ear normal.  Mouth/Throat: Oropharynx is clear and moist.       Nares are boggy but clear   Eyes: Conjunctivae and EOM are normal. Pupils are equal, round, and reactive to light.  Neck: Normal range of motion. Neck supple. No JVD present. No thyromegaly present.       1.5 cm round smooth mobile lump felt just under lateral R mandible  Non tender No skin change   No thyromegaly or thyroid bruit   Cardiovascular: Normal rate, regular rhythm, normal heart sounds and intact distal pulses.   Pulmonary/Chest: Effort normal and breath sounds normal. No respiratory distress. She has no wheezes.  Abdominal: Soft. Bowel sounds are normal. She exhibits no distension and no mass. There is no tenderness.  Musculoskeletal: She exhibits no edema.  Lymphadenopathy:    She has no cervical adenopathy.  Skin: Skin is warm and dry. No rash noted. No erythema. No pallor.  Psychiatric: She  has a normal mood and affect.          Assessment & Plan:

## 2010-12-22 ENCOUNTER — Other Ambulatory Visit: Payer: Self-pay | Admitting: Otolaryngology

## 2010-12-22 DIAGNOSIS — R220 Localized swelling, mass and lump, head: Secondary | ICD-10-CM

## 2010-12-25 ENCOUNTER — Ambulatory Visit
Admission: RE | Admit: 2010-12-25 | Discharge: 2010-12-25 | Disposition: A | Discharge status: Home / Self Care | Payer: 59 | Source: Ambulatory Visit | Attending: Otolaryngology | Admitting: Otolaryngology

## 2010-12-25 DIAGNOSIS — R220 Localized swelling, mass and lump, head: Secondary | ICD-10-CM

## 2010-12-25 DIAGNOSIS — R221 Localized swelling, mass and lump, neck: Secondary | ICD-10-CM

## 2010-12-25 MED ORDER — IOHEXOL 300 MG/ML  SOLN
75.0000 mL | Freq: Once | INTRAMUSCULAR | Status: AC | PRN
  Administered 2010-12-25: 75 mL via INTRAVENOUS

## 2011-01-30 ENCOUNTER — Ambulatory Visit (INDEPENDENT_AMBULATORY_CARE_PROVIDER_SITE_OTHER): Payer: 59 | Admitting: Family Medicine

## 2011-01-30 ENCOUNTER — Encounter: Payer: Self-pay | Admitting: Family Medicine

## 2011-01-30 VITALS — BP 124/84 | HR 76 | Temp 98.3°F | Resp 20 | Ht 64.0 in | Wt 175.8 lb

## 2011-01-30 DIAGNOSIS — I10 Essential (primary) hypertension: Secondary | ICD-10-CM

## 2011-01-30 DIAGNOSIS — J40 Bronchitis, not specified as acute or chronic: Secondary | ICD-10-CM

## 2011-01-30 MED ORDER — BENZONATATE 200 MG PO CAPS: 200.0000 mg | ORAL_CAPSULE | Freq: Three times a day (TID) | ORAL | Status: AC | PRN

## 2011-01-30 MED ORDER — CHLORPHENIRAMINE-HYDROCODONE 8-10 MG/5ML PO LQCR: 5.0000 mL | Freq: Every evening | ORAL | Status: DC | PRN

## 2011-01-30 MED ORDER — BENZONATATE 200 MG PO CAPS: 200.0000 mg | ORAL_CAPSULE | Freq: Three times a day (TID) | ORAL | Status: DC | PRN

## 2011-01-30 NOTE — Patient Instructions (Signed)
I think you have viral bronchitis - which will take a while to get better  Use the sedating tussionex at night for cough  Use the tessalon for day up to three times daily  No more losenges Drink lots of fluids If worse or fever/ other new symptoms or not starting to improve in a week , please let me know

## 2011-01-30 NOTE — Progress Notes (Signed)
Subjective:    Patient ID: Meghan Dixon, female    DOB: 21-Jun-1945, 65 y.o.   MRN: [id number]  HPI Last Thursday started a cough - and started mucinex Got worse Then nyquil for cough and cold  A little imp during the day- but worse at night  Croupy cough at night  Cough drops all day  Coughs so hard her ear and head hurt  Sounds junky - no production yet  Mouth feels burned - ? From cough drops   Gagging from cough  No exp to sick people that she knows of   Eyes are watery  No nasal symptoms   No fever or chills  Patient Active Problem List  Diagnoses  . HSV  . HYPERCHOLESTEROLEMIA  . TENSION HEADACHE  . MIGRAINE HEADACHE  . SYNDROME, CARPAL TUNNEL  . BENIGN POSITIONAL VERTIGO  . ESSENTIAL HYPERTENSION  . ALLERGIC RHINITIS  . FIBROCYSTIC BREAST DISEASE  . BACK PAIN, LUMBAR  . SCIATICA, LEFT  . TROCHANTERIC BURSITIS, LEFT  . ILIOTIBIAL BAND SYNDROME, LEFT KNEE  . LEG PAIN, LEFT  . OSTEOPENIA  . Routine general medical examination at a health care facility  . Hypothyroid  . Swelling, mass, or lump in head and neck  . Bronchitis   Past Medical History  Diagnosis Date  . Allergy     allergic rhinitis  . Osteopenia   . Hypertension   . HSV infection    Past Surgical History  Procedure Date  . Abdominal hysterectomy     for bleeding and ovarian cyst  . Appendectomy   . Tonsillectomy    History  Substance Use Topics  . Smoking status: Former Smoker    Quit date: 04/16/1984  . Smokeless tobacco: Not on file  . Alcohol Use: Not on file   Family History  Problem Relation Age of Onset  . Heart disease Mother   . Cancer Father     esophageal CA  . Alcohol abuse Father   . Arthritis Sister     RA  . Heart disease Brother     CAD   Allergies  Allergen Reactions  . Beta Adrenergic Blockers     REACTION: reaction not known  . Cefuroxime Axetil     REACTION: reaction not known  . Cetirizine Hcl     REACTION: dizzy  . Eggs Or Egg-Derived Products      REACTION: reaction not known  . Fexofenadine     REACTION: dizzy  . Flonase (Fluticasone Propionate) Other (See Comments)    dizziness  . Prednisone     REACTION: reaction not known   Current Outpatient Prescriptions on File Prior to Visit  Medication Sig Dispense Refill  . amLODipine (NORVASC) 5 MG tablet Take 1 tablet (5 mg total) by mouth daily.  90 tablet  3  . calcium carbonate (OS-CAL) 600 MG TABS Take 1,200 mg by mouth daily.        Dewayne Shorter Seed OIL Take 3 tablets by mouth daily.        . Cyanocobalamin (VITAMIN B 12 PO) Take 500 mcg by mouth daily.        Marland Kitchen levothyroxine (SYNTHROID, LEVOTHROID) 125 MCG tablet Take 1 tablet (125 mcg total) by mouth daily.  30 tablet  0  . Multiple Vitamin (MULTIVITAMIN) capsule Take 1 capsule by mouth daily.        . Nutritional Supplements (GLUCOSAMINE COMPLEX PO) Take 2 tablets by mouth daily.       . vitamin  E 400 UNIT capsule Take 400 Units by mouth daily.        Marland Kitchen acyclovir (ZOVIRAX) 5 % cream Apply 1 application topically every 3 (three) hours. Apply to affected area 5 times daily as needed for breakout.  15 g  3  . eletriptan (RELPAX) 40 MG tablet One tablet by mouth as needed for migraine headache.  If the headache improves and then returns, dose may be repeated after 2 hours have elapsed since first dose (do not exceed 80 mg per day). 1 tablet by mouth once a day as needed.  27 tablet  3  . fluticasone (FLONASE) 50 MCG/ACT nasal spray Place 2 sprays into the nose daily.  16 g  3  . loratadine (CLARITIN) 10 MG tablet Take 10 mg by mouth daily as needed.        . meclizine (ANTIVERT) 25 MG tablet Take 25-50 mg by mouth 3 (three) times daily as needed.        . ondansetron (ZOFRAN ODT) 4 MG disintegrating tablet Take 4 mg by mouth every 12 (twelve) hours as needed.        . promethazine (PHENERGAN) 25 MG tablet Take 25 mg by mouth every 8 (eight) hours as needed.        Marland Kitchen tiZANidine (ZANAFLEX) 4 MG capsule Take 4 mg by mouth at bedtime.         . valACYclovir (VALTREX) 500 MG tablet Take 1 tablet (500 mg total) by mouth daily as needed. for break out  45 tablet  3       Review of Systems Review of Systems  Constitutional: Negative for fever, appetite change, fatigue and unexpected weight change.  Eyes: Negative for pain and visual disturbance. ENT neg for nasal symptoms , no ST  Respiratory: pos for cough , neg for sob or wheeze .   Cardiovascular: Negative for cp or palpitations    Gastrointestinal: Negative for nausea, diarrhea and constipation.  Genitourinary: Negative for urgency and frequency.  Skin: Negative for pallor or rash   Neurological: Negative for weakness, light-headedness, numbness and headaches.  Hematological: Negative for adenopathy. Does not bruise/bleed easily.  Psychiatric/Behavioral: Negative for dysphoric mood. The patient is not nervous/anxious.          Objective:   Physical Exam  Constitutional: She appears well-developed and well-nourished. No distress.  HENT:  Head: Normocephalic and atraumatic.  Right Ear: External ear normal.  Left Ear: External ear normal.  Nose: Nose normal.  Mouth/Throat: Oropharynx is clear and moist.       No sinus tenderness  Eyes: Conjunctivae and EOM are normal. Pupils are equal, round, and reactive to light. Right eye exhibits no discharge. Left eye exhibits no discharge.  Neck: Normal range of motion. Neck supple. No JVD present. No thyromegaly present.  Cardiovascular: Normal rate, regular rhythm and normal heart sounds.   Pulmonary/Chest: Effort normal and breath sounds normal. No respiratory distress. She has no wheezes. She has no rales.       Harsh bs with a few scattered rhonchi No rales or wheeze  Lymphadenopathy:    She has no cervical adenopathy.  Neurological: She is alert. She has normal reflexes.  Skin: Skin is warm and dry. No rash noted. No erythema. No pallor.  Psychiatric: She has a normal mood and affect.          Assessment &  Plan:

## 2011-01-30 NOTE — Assessment & Plan Note (Signed)
Good control overall No change in medicines

## 2011-01-30 NOTE — Assessment & Plan Note (Signed)
Acute/ viral without reactive airways  tx cough with tussionex for night and tessalon for day Fluids/ rest/ sympt care  Adv to call if worse or not imp in 1 week

## 2011-03-01 ENCOUNTER — Encounter: Payer: Self-pay | Admitting: Gastroenterology

## 2011-03-21 ENCOUNTER — Other Ambulatory Visit (INDEPENDENT_AMBULATORY_CARE_PROVIDER_SITE_OTHER): Payer: 59

## 2011-03-21 ENCOUNTER — Encounter: Payer: Self-pay | Admitting: Gastroenterology

## 2011-03-21 ENCOUNTER — Ambulatory Visit (INDEPENDENT_AMBULATORY_CARE_PROVIDER_SITE_OTHER): Payer: 59 | Admitting: Gastroenterology

## 2011-03-21 DIAGNOSIS — R109 Unspecified abdominal pain: Secondary | ICD-10-CM

## 2011-03-21 LAB — COMPREHENSIVE METABOLIC PANEL
ALT: 22 U/L (ref 0–35)
BUN: 19 mg/dL (ref 6–23)
CO2: 29 mEq/L (ref 19–32)
Calcium: 9.1 mg/dL (ref 8.4–10.5)
Chloride: 104 mEq/L (ref 96–112)
Creatinine, Ser: 0.8 mg/dL (ref 0.4–1.2)
GFR: 74.77 mL/min (ref >60.00)
Total Bilirubin: 0.4 mg/dL (ref 0.3–1.2)

## 2011-03-21 LAB — CBC WITH DIFFERENTIAL/PLATELET
Basophils Relative: 0.6 % (ref 0.0–3.0)
Eosinophils Relative: 3.2 % (ref 0.0–5.0)
HCT: 38.8 % (ref 36.0–46.0)
Hemoglobin: 13.1 g/dL (ref 12.0–15.0)
Lymphocytes Relative: 26.6 % (ref 12.0–46.0)
Lymphs Abs: 1.7 10*3/uL (ref 0.7–4.0)
Monocytes Relative: 10.2 % (ref 3.0–12.0)
Neutro Abs: 3.8 10*3/uL (ref 1.4–7.7)
RBC: 4.25 Mil/uL (ref 3.87–5.11)

## 2011-03-21 NOTE — Progress Notes (Signed)
HPI: This is a   very pleasant 65 year old woman who I am meeting for the first time  Colonoscopy in 04/2001 with Magod this found hemorrhoids, tortuous colon but no polyps.,   Has been having right sided upper quad pain.  Lasted 3 days about 2 weeks.  Since then intermittent, especially after eating.  NO nausea. No radiation of pain. Throbbing, colicy pain.  She has not had in the imaging or blood work for this pain   Overall stable weight.  No pyrosis.    No changes in her bowels.    Review of systems: Pertinent positive and negative review of systems were noted in the above HPI section. Complete review of systems was performed and was otherwise normal.    Past Medical History  Diagnosis Date  . Allergy     allergic rhinitis  . Osteopenia   . Hypertension   . HSV infection   . Hyperthyroidism     Past Surgical History  Procedure Date  . Abdominal hysterectomy     for bleeding and ovarian cyst  . Appendectomy   . Tonsillectomy   . Carpal tunnel release     left  . Foot surgery     bilateral    Current Outpatient Prescriptions  Medication Sig Dispense Refill  . acyclovir (ZOVIRAX) 5 % cream Apply 1 application topically every 3 (three) hours. Apply to affected area 5 times daily as needed for breakout.  15 g  3  . amLODipine (NORVASC) 5 MG tablet Take 1 tablet (5 mg total) by mouth daily.  90 tablet  3  . calcium carbonate (OS-CAL) 600 MG TABS Take 1,200 mg by mouth daily.        Dewayne Shorter Seed OIL Take 3 tablets by mouth daily.        . Cyanocobalamin (VITAMIN B 12 PO) Take 500 mcg by mouth daily.        Marland Kitchen eletriptan (RELPAX) 40 MG tablet One tablet by mouth as needed for migraine headache.  If the headache improves and then returns, dose may be repeated after 2 hours have elapsed since first dose (do not exceed 80 mg per day). 1 tablet by mouth once a day as needed.  27 tablet  3  . levothyroxine (SYNTHROID, LEVOTHROID) 125 MCG tablet Take 1 tablet (125 mcg total) by  mouth daily.  30 tablet  0  . meclizine (ANTIVERT) 25 MG tablet Take 25-50 mg by mouth 3 (three) times daily as needed.        . Multiple Vitamin (MULTIVITAMIN) capsule Take 1 capsule by mouth daily.        . Nutritional Supplements (GLUCOSAMINE COMPLEX PO) Take 2 tablets by mouth daily.       . promethazine (PHENERGAN) 25 MG tablet Take 25 mg by mouth every 8 (eight) hours as needed.        . valACYclovir (VALTREX) 500 MG tablet Take 1 tablet (500 mg total) by mouth daily as needed. for break out  45 tablet  3  . vitamin E 400 UNIT capsule Take 400 Units by mouth daily.        . chlorpheniramine-hydrocodone (TUSSIONEX) 8-10 MG/5ML suspension Take 5 mLs by mouth at bedtime as needed for cough.  60 mL  0  . Dextromethorphan Polistirex (DELSYM PO) Take by mouth as directed.        . Doxylamine-DM (VICKS NYQUIL COUGH PO) Take by mouth as directed.        . fluticasone (FLONASE)  50 MCG/ACT nasal spray Place 2 sprays into the nose daily.  16 g  3  . GuaiFENesin (MUCINEX PO) Take by mouth as directed.        . loratadine (CLARITIN) 10 MG tablet Take 10 mg by mouth daily as needed.        . ondansetron (ZOFRAN ODT) 4 MG disintegrating tablet Take 4 mg by mouth every 12 (twelve) hours as needed.        Marland Kitchen tiZANidine (ZANAFLEX) 4 MG capsule Take 4 mg by mouth at bedtime.          Allergies as of 03/21/2011 - Review Complete 03/21/2011  Allergen Reaction Noted  . Beta adrenergic blockers  10/21/2006  . Cefuroxime axetil  10/21/2006  . Cetirizine hcl  10/21/2006  . Eggs or egg-derived products  10/21/2006  . Fexofenadine  10/21/2006  . Flonase (fluticasone propionate) Other (See Comments) 12/15/2010  . Prednisone  10/21/2006    Family History  Problem Relation Age of Onset  . Heart disease Mother   . Cancer Father     esophageal CA  . Alcohol abuse Father   . Arthritis Sister     RA  . Heart disease Brother     CAD    History   Social History  . Marital Status: Married    Spouse Name:  N/A    Number of Children: 1  . Years of Education: N/A   Occupational History  . waitress    Social History Main Topics  . Smoking status: Former Smoker    Quit date: 04/16/1984  . Smokeless tobacco: Never Used  . Alcohol Use: No  . Drug Use: No  . Sexually Active: Not on file   Other Topics Concern  . Not on file   Social History Narrative   Drinks about 4 caffeinated drinks a day       Physical Exam: BP 118/80  Pulse 72  Ht 5\' 4"  (1.626 m)  Wt 177 lb 9.6 oz (80.559 kg)  BMI 30.49 kg/m2  SpO2 98% Constitutional: generally well-appearing Psychiatric: alert and oriented x3 Eyes: extraocular movements intact Mouth: oral pharynx moist, no lesions Neck: supple no lymphadenopathy Cardiovascular: heart regular rate and rhythm Lungs: clear to auscultation bilaterally Abdomen: soft, nontender, nondistended, no obvious ascites, no peritoneal signs, normal bowel sounds Extremities: no lower extremity edema bilaterally Skin: no lesions on visible extremities    Assessment and plan: 65 y.o. female with  intermittent right upper quadrant pain, routine risk for colon cancer  her pains sound biliary. We will set her up with an ultrasound and if this shows gallstones I will refer her to a general surgeon to consider laparoscopic cholecystectomy. She is due for screening colonoscopy in January 2013. We will put her in our reminder system for that, I think it should be done after her right upper quadrant pains have been figured out and treated. She will also do some blood work today including a CBC, complete metabolic profile

## 2011-03-21 NOTE — Patient Instructions (Addendum)
You will be set up for an ultrasound for RUQ pains, check for gallstones.  03/22/11 8 am at [facility] please arrive at 745 am and have nothing to eat or drink after midnight. You need a colonoscopy for routine screening, but lets wait until the RUQ pain is taken care of (will put in our reminder system for February). You will have labs checked today in the basement lab.  Please head down after you check out with the front desk  (cbc, cmet).

## 2011-03-22 ENCOUNTER — Ambulatory Visit (HOSPITAL_COMMUNITY)
Admission: RE | Admit: 2011-03-22 | Discharge: 2011-03-22 | Disposition: A | Discharge status: Home / Self Care | Payer: 59 | Source: Ambulatory Visit | Attending: Gastroenterology | Admitting: Gastroenterology

## 2011-03-22 DIAGNOSIS — R109 Unspecified abdominal pain: Secondary | ICD-10-CM | POA: Insufficient documentation

## 2011-03-26 ENCOUNTER — Other Ambulatory Visit: Payer: Self-pay | Admitting: Gastroenterology

## 2011-03-26 NOTE — Progress Notes (Signed)
04/18/11 8 am [facility] NPO midnight arrive 745 am pt aware

## 2011-04-18 ENCOUNTER — Encounter (HOSPITAL_COMMUNITY)
Admission: RE | Admit: 2011-04-18 | Discharge: 2011-04-18 | Disposition: A | Discharge status: Home / Self Care | Payer: 59 | Source: Ambulatory Visit | Attending: Gastroenterology | Admitting: Gastroenterology

## 2011-04-18 DIAGNOSIS — R142 Eructation: Secondary | ICD-10-CM | POA: Insufficient documentation

## 2011-04-18 DIAGNOSIS — K838 Other specified diseases of biliary tract: Secondary | ICD-10-CM | POA: Insufficient documentation

## 2011-04-18 DIAGNOSIS — R1011 Right upper quadrant pain: Secondary | ICD-10-CM | POA: Insufficient documentation

## 2011-04-18 DIAGNOSIS — R141 Gas pain: Secondary | ICD-10-CM | POA: Insufficient documentation

## 2011-04-18 DIAGNOSIS — R143 Flatulence: Secondary | ICD-10-CM | POA: Insufficient documentation

## 2011-04-18 MED ORDER — TECHNETIUM TC 99M MEBROFENIN IV KIT
4.9000 | PACK | Freq: Once | INTRAVENOUS | Status: AC | PRN
  Administered 2011-04-18: 5 via INTRAVENOUS

## 2011-04-19 ENCOUNTER — Other Ambulatory Visit: Payer: Self-pay

## 2011-04-23 ENCOUNTER — Ambulatory Visit (AMBULATORY_SURGERY_CENTER): Payer: 59 | Admitting: *Deleted

## 2011-04-23 ENCOUNTER — Encounter: Payer: Self-pay | Admitting: Gastroenterology

## 2011-04-23 VITALS — Ht 65.0 in | Wt 177.0 lb

## 2011-04-23 DIAGNOSIS — R109 Unspecified abdominal pain: Secondary | ICD-10-CM

## 2011-04-23 DIAGNOSIS — Z1211 Encounter for screening for malignant neoplasm of colon: Secondary | ICD-10-CM

## 2011-04-23 MED ORDER — PEG-KCL-NACL-NASULF-NA ASC-C 100 G PO SOLR: ORAL | Status: DC

## 2011-04-30 ENCOUNTER — Encounter: Payer: Self-pay | Admitting: Gastroenterology

## 2011-04-30 ENCOUNTER — Ambulatory Visit (AMBULATORY_SURGERY_CENTER): Payer: 59 | Admitting: Gastroenterology

## 2011-04-30 DIAGNOSIS — R109 Unspecified abdominal pain: Secondary | ICD-10-CM

## 2011-04-30 DIAGNOSIS — K2970 Gastritis, unspecified, without bleeding: Secondary | ICD-10-CM

## 2011-04-30 DIAGNOSIS — K2990 Gastroduodenitis, unspecified, without bleeding: Secondary | ICD-10-CM

## 2011-04-30 DIAGNOSIS — K5730 Diverticulosis of large intestine without perforation or abscess without bleeding: Secondary | ICD-10-CM

## 2011-04-30 DIAGNOSIS — Z1211 Encounter for screening for malignant neoplasm of colon: Secondary | ICD-10-CM

## 2011-04-30 HISTORY — PX: COLONOSCOPY: SHX174

## 2011-04-30 HISTORY — PX: ESOPHAGOGASTRODUODENOSCOPY: SHX1529

## 2011-04-30 MED ORDER — SODIUM CHLORIDE 0.9 % IV SOLN: 500.0000 mL | INTRAVENOUS | Status: DC

## 2011-04-30 NOTE — Progress Notes (Signed)
Patient did not experience any of the following events: a burn prior to discharge; a fall within the facility; wrong site/side/patient/procedure/implant event; or a hospital transfer or hospital admission upon discharge from the facility. (G8907) Patient did not have preoperative order for IV antibiotic SSI prophylaxis. (G8918)  

## 2011-04-30 NOTE — Op Note (Signed)
[facility] [address] [place], [place]  [address]  COLONOSCOPY PROCEDURE REPORT  PATIENT:  Meghan Dixon, Meghan Dixon  MR#:  [id number] BIRTHDATE:  13-Sep-1945, 65 yrs. old  GENDER:  female ENDOSCOPIST:  Rachael Fee, MD REF. BY:  Marne A. Milinda Antis, M.D. PROCEDURE DATE:  04/30/2011 PROCEDURE:  Colonoscopy 62130 ASA CLASS:  Class II INDICATIONS:  Routine Risk Screening MEDICATIONS:   Fentanyl 75 mcg IV, These medications were titrated to patient response per physician's verbal order, Versed 6 mg IV  DESCRIPTION OF PROCEDURE:   After the risks benefits and alternatives of the procedure were thoroughly explained, informed consent was obtained.  Digital rectal exam was performed and revealed no rectal masses.   The LB [id number] endoscope was introduced through the anus and advanced to the cecum, which was identified by both the appendix and ileocecal valve, without limitations.  The quality of the prep was good..  The instrument was then slowly withdrawn as the colon was fully examined. <<PROCEDUREIMAGES>> FINDINGS:  Mild diverticulosis was found in the sigmoid to descending colon segments (see image3).  This was otherwise a normal examination of the colon (see image4, image2, and image1). Retroflexed views in the rectum revealed no abnormalities. COMPLICATIONS:  None  ENDOSCOPIC IMPRESSION: 1) Mild diverticulosis in the sigmoid to descending colon segments 2) Otherwise normal examination. No polyps or cancers  RECOMMENDATIONS: 1) You should continue to follow colorectal cancer screening guidelines for "routine risk" patients with a repeat colonoscopy in 10 years. There is no need for FOBT (stool) testing for at least 5 years.  REPEAT EXAM:  10 years  ______________________________ Rachael Fee, MD  n. eSIGNED:   Rachael Fee at 04/30/2011 03:48 PM  Godina, Meriam Sprague, [phone]

## 2011-04-30 NOTE — Patient Instructions (Addendum)
FOLLOW DISCHARGE INSTRUCTIONS (BLUE AND GREEN sheets.

## 2011-04-30 NOTE — Op Note (Signed)
[facility] [address] [place], [place]  [address]  ENDOSCOPY PROCEDURE REPORT  PATIENT:  Meghan Dixon, Meghan Dixon  MR#:  [id number] BIRTHDATE:  [birth year], 65 yrs. old  GENDER:  female ENDOSCOPIST:  Rachael Fee, MD PROCEDURE DATE:  04/30/2011 PROCEDURE:  EGD with biopsy, 43239 ASA CLASS:  Class II INDICATIONS:  intermittent RUQ pains, bloating.  Normal LFTs, [place] no stones, CBD 7.54mm, HIDA normal EF MEDICATIONS:  There was residual sedation effect present from prior procedure., These medications were titrated to patient response per physician's verbal order, Versed 2 mg IV TOPICAL ANESTHETIC:  Cetacaine Spray  DESCRIPTION OF PROCEDURE:   After the risks benefits and alternatives of the procedure were thoroughly explained, informed consent was obtained.  The LB [id number]-H180 [id number] endoscope was introduced through the mouth and advanced to the second portion of the duodenum, without limitations.  The instrument was slowly withdrawn as the mucosa was fully examined. <<PROCEDUREIMAGES>> There was mild, non-specific gastritis. Biopsies were taken and sent to pathology (jar 1) (see image3).  Otherwise the examination was normal (see image4, image5, image7, image2, and image1). Retroflexed views revealed no abnormalities.    The scope was then withdrawn from the patient and the procedure completed. COMPLICATIONS:  None  ENDOSCOPIC IMPRESSION: 1) Mild gastritis; biopsied to check for H. pylori 2) Otherwise normal examination  RECOMMENDATIONS: If biopsies show H. pylori, you will be started on appropriate antibiotics.  If not, then will set up MRI (MRCP) of bile ducts for further evaluation of slightly dilated common bile duct.  ______________________________ Rachael Fee, MD  cc: Roxy Manns, MD  n. Rosalie Doctor:   Rachael Fee at 04/30/2011 03:56 PM  Dario, Meriam Sprague, [phone]

## 2011-05-01 ENCOUNTER — Telehealth: Payer: Self-pay | Admitting: *Deleted

## 2011-05-01 NOTE — Telephone Encounter (Signed)

## 2011-05-04 ENCOUNTER — Other Ambulatory Visit: Payer: Self-pay

## 2011-05-04 DIAGNOSIS — K838 Other specified diseases of biliary tract: Secondary | ICD-10-CM

## 2011-05-04 NOTE — Progress Notes (Signed)
Pt has been scheduled for her MR MRCP for Sunday 05/06/11 845 am NPO after midnight pt aware

## 2011-05-06 ENCOUNTER — Ambulatory Visit (HOSPITAL_COMMUNITY)
Admission: RE | Admit: 2011-05-06 | Discharge: 2011-05-06 | Disposition: A | Discharge status: Home / Self Care | Payer: 59 | Source: Ambulatory Visit | Attending: Gastroenterology | Admitting: Gastroenterology

## 2011-05-06 ENCOUNTER — Other Ambulatory Visit: Payer: Self-pay | Admitting: Gastroenterology

## 2011-05-06 DIAGNOSIS — R109 Unspecified abdominal pain: Secondary | ICD-10-CM | POA: Insufficient documentation

## 2011-05-06 DIAGNOSIS — K8020 Calculus of gallbladder without cholecystitis without obstruction: Secondary | ICD-10-CM

## 2011-05-06 DIAGNOSIS — K838 Other specified diseases of biliary tract: Secondary | ICD-10-CM

## 2011-05-06 DIAGNOSIS — K7689 Other specified diseases of liver: Secondary | ICD-10-CM | POA: Insufficient documentation

## 2011-05-06 LAB — CREATININE, SERUM
Creatinine, Ser: 0.71 mg/dL (ref 0.50–1.10)
GFR calc Af Amer: >90 mL/min (ref >90)
GFR calc non Af Amer: 90 mL/min — ABNORMAL LOW (ref >90)

## 2011-05-06 MED ORDER — GADOBENATE DIMEGLUMINE 529 MG/ML IV SOLN
16.0000 mL | Freq: Once | INTRAVENOUS | Status: AC | PRN
  Administered 2011-05-06: 16 mL via INTRAVENOUS

## 2011-08-09 ENCOUNTER — Telehealth: Payer: Self-pay | Admitting: Family Medicine

## 2011-08-09 NOTE — Telephone Encounter (Signed)
Scheduled patient appt for CPX on 09/03/2011 30 min with labs the week before.

## 2011-08-09 NOTE — Telephone Encounter (Signed)
Go ahead and create a 30 minute slot for her please

## 2011-08-09 NOTE — Telephone Encounter (Signed)
Patient is calling to schedule her cpx appointment.  Her last cpx was in [id number].  Patient was upset when told the next cpx appointment would be in October.  Patient asked if she can be seen in May for her cpx since her medications will be running out.

## 2011-08-13 ENCOUNTER — Other Ambulatory Visit: Payer: Self-pay | Admitting: Family Medicine

## 2011-08-13 DIAGNOSIS — Z1231 Encounter for screening mammogram for malignant neoplasm of breast: Secondary | ICD-10-CM

## 2011-08-26 ENCOUNTER — Telehealth: Payer: Self-pay | Admitting: Family Medicine

## 2011-08-26 DIAGNOSIS — Z0000 Encounter for general adult medical examination without abnormal findings: Secondary | ICD-10-CM

## 2011-08-26 DIAGNOSIS — E7800 Pure hypercholesterolemia, unspecified: Secondary | ICD-10-CM

## 2011-08-26 DIAGNOSIS — E039 Hypothyroidism, unspecified: Secondary | ICD-10-CM

## 2011-08-26 DIAGNOSIS — M899 Disorder of bone, unspecified: Secondary | ICD-10-CM

## 2011-08-26 NOTE — Telephone Encounter (Signed)
Message copied by Judy Pimple on Sun Aug 26, 2011  6:43 PM ------      Message from: Baldomero Lamy      Created: Wed Aug 22, 2011 10:14 AM      Regarding: Cpx labs Mon 5/13       Please order  future cpx labs for pt's upcomming lab appt.      Thanks      Rodney Booze

## 2011-08-27 ENCOUNTER — Other Ambulatory Visit (INDEPENDENT_AMBULATORY_CARE_PROVIDER_SITE_OTHER): Payer: 59

## 2011-08-27 DIAGNOSIS — Z0000 Encounter for general adult medical examination without abnormal findings: Secondary | ICD-10-CM

## 2011-08-27 DIAGNOSIS — M899 Disorder of bone, unspecified: Secondary | ICD-10-CM

## 2011-08-27 DIAGNOSIS — E7800 Pure hypercholesterolemia, unspecified: Secondary | ICD-10-CM

## 2011-08-27 DIAGNOSIS — E039 Hypothyroidism, unspecified: Secondary | ICD-10-CM

## 2011-08-27 LAB — COMPREHENSIVE METABOLIC PANEL
Albumin: 4 g/dL (ref 3.5–5.2)
BUN: 15 mg/dL (ref 6–23)
CO2: 26 mEq/L (ref 19–32)
Calcium: 9 mg/dL (ref 8.4–10.5)
Chloride: 100 mEq/L (ref 96–112)
Creatinine, Ser: 0.6 mg/dL (ref 0.4–1.2)
GFR: 105.05 mL/min (ref >60.00)
Glucose, Bld: 95 mg/dL (ref 70–99)
Potassium: 4.2 mEq/L (ref 3.5–5.1)

## 2011-08-27 LAB — CBC WITH DIFFERENTIAL/PLATELET
Basophils Relative: 0.5 % (ref 0.0–3.0)
Eosinophils Absolute: 0.2 10*3/uL (ref 0.0–0.7)
Eosinophils Relative: 3.6 % (ref 0.0–5.0)
Hemoglobin: 13.5 g/dL (ref 12.0–15.0)
Lymphocytes Relative: 26.1 % (ref 12.0–46.0)
MCHC: 32.8 g/dL (ref 30.0–36.0)
Neutro Abs: 3.7 10*3/uL (ref 1.4–7.7)
RBC: 4.46 Mil/uL (ref 3.87–5.11)
WBC: 6.1 10*3/uL (ref 4.5–10.5)

## 2011-08-27 LAB — LIPID PANEL
Cholesterol: 181 mg/dL (ref 0–200)
HDL: 57.1 mg/dL (ref >39.00)
Triglycerides: 94 mg/dL (ref 0.0–149.0)

## 2011-08-28 ENCOUNTER — Ambulatory Visit
Admission: RE | Admit: 2011-08-28 | Discharge: 2011-08-28 | Disposition: A | Discharge status: Home / Self Care | Payer: 59 | Source: Ambulatory Visit | Attending: Family Medicine | Admitting: Family Medicine

## 2011-08-28 DIAGNOSIS — Z1231 Encounter for screening mammogram for malignant neoplasm of breast: Secondary | ICD-10-CM

## 2011-08-29 ENCOUNTER — Encounter: Payer: Self-pay | Admitting: *Deleted

## 2011-08-31 ENCOUNTER — Other Ambulatory Visit: Payer: 59

## 2011-09-03 ENCOUNTER — Other Ambulatory Visit (HOSPITAL_COMMUNITY)
Admission: RE | Admit: 2011-09-03 | Discharge: 2011-09-03 | Disposition: A | Discharge status: Home / Self Care | Payer: 59 | Source: Ambulatory Visit | Attending: Family Medicine | Admitting: Family Medicine

## 2011-09-03 ENCOUNTER — Encounter: Payer: Self-pay | Admitting: Family Medicine

## 2011-09-03 ENCOUNTER — Ambulatory Visit (INDEPENDENT_AMBULATORY_CARE_PROVIDER_SITE_OTHER): Payer: 59 | Admitting: Family Medicine

## 2011-09-03 VITALS — BP 120/80 | HR 69 | Temp 98.0°F | Ht 64.0 in | Wt 175.5 lb

## 2011-09-03 DIAGNOSIS — E559 Vitamin D deficiency, unspecified: Secondary | ICD-10-CM | POA: Insufficient documentation

## 2011-09-03 DIAGNOSIS — I10 Essential (primary) hypertension: Secondary | ICD-10-CM

## 2011-09-03 DIAGNOSIS — M949 Disorder of cartilage, unspecified: Secondary | ICD-10-CM

## 2011-09-03 DIAGNOSIS — E669 Obesity, unspecified: Secondary | ICD-10-CM | POA: Insufficient documentation

## 2011-09-03 DIAGNOSIS — E039 Hypothyroidism, unspecified: Secondary | ICD-10-CM

## 2011-09-03 DIAGNOSIS — Z01419 Encounter for gynecological examination (general) (routine) without abnormal findings: Secondary | ICD-10-CM | POA: Insufficient documentation

## 2011-09-03 DIAGNOSIS — Z0000 Encounter for general adult medical examination without abnormal findings: Secondary | ICD-10-CM

## 2011-09-03 DIAGNOSIS — E7800 Pure hypercholesterolemia, unspecified: Secondary | ICD-10-CM

## 2011-09-03 MED ORDER — FLUTICASONE PROPIONATE 50 MCG/ACT NA SUSP: 2.0000 | Freq: Every day | NASAL | Status: DC

## 2011-09-03 MED ORDER — LEVOTHYROXINE SODIUM 125 MCG PO TABS: 125.0000 ug | ORAL_TABLET | Freq: Every day | ORAL | Status: DC

## 2011-09-03 MED ORDER — AMLODIPINE BESYLATE 5 MG PO TABS: 5.0000 mg | ORAL_TABLET | Freq: Every day | ORAL | Status: DC

## 2011-09-03 NOTE — Assessment & Plan Note (Signed)
Disc goals for lipids and reasons to control them Rev labs with pt Rev low sat fat diet in detail  Overall doing well controlling this with diet

## 2011-09-03 NOTE — Assessment & Plan Note (Signed)
Vit D level slt low Will inc to 2000 iu of D3 otc in combo with ca plus D  Continue to monitor  Disc imp to bone and overall health

## 2011-09-03 NOTE — Assessment & Plan Note (Signed)
tsh stable and no clinical changes  Reassuring  No change in medicine

## 2011-09-03 NOTE — Assessment & Plan Note (Signed)
Rev old dexa- mild  Rev ca and D Inc D to 2000 iu daily extra  Continue exercise

## 2011-09-03 NOTE — Assessment & Plan Note (Signed)
Routine exam Pt had hyst  She requested pap smear  No problems

## 2011-09-03 NOTE — Assessment & Plan Note (Signed)
Reviewed health habits including diet and exercise and skin cancer prevention Also reviewed health mt list, fam hx and immunizations   Rev wellness labs  

## 2011-09-03 NOTE — Progress Notes (Signed)
Subjective:    Patient ID: Meghan Dixon, female    DOB: 12-Jun-1945, 66 y.o.   MRN: [id number]  HPI Here for health maintenance exam and to review chronic medical problems    Has done well except for sinus / all from pollen - which causes migraines Otherwise doing well   Wt is down 2 lb with bmi of 30  bp is   ok  Today BP Readings from Last 3 Encounters:  09/03/11 120/80  04/30/11 133/86  03/21/11 118/80    No cp or palpitations or headaches or edema  No side effects to medicines    Lipids Lab Results  Component Value Date   CHOL 181 08/27/2011   HDL 57.10 08/27/2011   LDLCALC 105* 08/27/2011   LDLDIRECT 118.5 06/05/2006   TRIG 94.0 08/27/2011   CHOLHDL 3 08/27/2011   diet--is really careful with fats  Exercise- 3 times per week walks 2 miles, took line dancing for a while and plans to swim  Yard work , also has a garden   Hypothyroid Lab Results  Component Value Date   TSH 2.96 08/27/2011   is more tired  Lab Results  Component Value Date   WBC 6.1 08/27/2011   HGB 13.5 08/27/2011   HCT 41.0 08/27/2011   MCV 91.9 08/27/2011   PLT 247.0 08/27/2011     Osteopenia  dexa 6/12 was slight  Ca and D- takes a ca supplement 1 per day  D level is 29 down from 38  Pap 2010- has had a hysterectomy Gyn- no problems Pt insists on gyn exam   mammo 5/13 Self exam - no lumps or changes   colonosc 1/13 is up to date No stool changes  Patient Active Problem List  Diagnoses  . HSV  . HYPERCHOLESTEROLEMIA  . TENSION HEADACHE  . MIGRAINE HEADACHE  . SYNDROME, CARPAL TUNNEL  . BENIGN POSITIONAL VERTIGO  . ESSENTIAL HYPERTENSION  . ALLERGIC RHINITIS  . FIBROCYSTIC BREAST DISEASE  . BACK PAIN, LUMBAR  . SCIATICA, LEFT  . TROCHANTERIC BURSITIS, LEFT  . ILIOTIBIAL BAND SYNDROME, LEFT KNEE  . LEG PAIN, LEFT  . OSTEOPENIA  . Routine general medical examination at a health care facility  . Hypothyroid  . Swelling, mass, or lump in head and neck  . Obesity  . Vitamin d  deficiency  . Gynecological examination   Past Medical History  Diagnosis Date  . Allergy     allergic rhinitis  . Osteopenia   . Hypertension   . HSV infection   . Hyperthyroidism   . Arthritis    Past Surgical History  Procedure Date  . Abdominal hysterectomy     for bleeding and ovarian cyst  . Appendectomy   . Tonsillectomy   . Carpal tunnel release     left  . Foot surgery     bilateral   History  Substance Use Topics  . Smoking status: Former Smoker    Quit date: 04/16/1984  . Smokeless tobacco: Never Used  . Alcohol Use: No   Family History  Problem Relation Age of Onset  . Heart disease Mother   . Cancer Father     esophageal CA  . Alcohol abuse Father   . Arthritis Sister     RA  . Heart disease Brother     CAD  . Colon cancer Neg Hx   . Stomach cancer Neg Hx    Allergies  Allergen Reactions  . Beta Adrenergic Blockers  REACTION: reaction not known  . Cefuroxime Axetil Hives    Throat closes  . Cetirizine Hcl     REACTION: dizzy  . Fexofenadine     REACTION: dizzy  . Flonase (Fluticasone Propionate) Other (See Comments)    dizziness  . Prednisone     REACTION: reaction not known   Current Outpatient Prescriptions on File Prior to Visit  Medication Sig Dispense Refill  . acyclovir (ZOVIRAX) 5 % cream Apply 1 application topically every 3 (three) hours. Apply to affected area 5 times daily as needed for breakout.  15 g  3  . calcium carbonate (OS-CAL) 600 MG TABS Take 1,200 mg by mouth daily.        . Cyanocobalamin (VITAMIN B 12 PO) Take 500 mcg by mouth daily.        Marland Kitchen eletriptan (RELPAX) 40 MG tablet One tablet by mouth as needed for migraine headache.  If the headache improves and then returns, dose may be repeated after 2 hours have elapsed since first dose (do not exceed 80 mg per day). 1 tablet by mouth once a day as needed.  27 tablet  3  . GuaiFENesin (MUCINEX PO) Take by mouth as directed.        . loratadine (CLARITIN) 10 MG  tablet Take 10 mg by mouth daily as needed.        . meclizine (ANTIVERT) 25 MG tablet Take 25-50 mg by mouth 3 (three) times daily as needed.        . meloxicam (MOBIC) 7.5 MG tablet       . Multiple Vitamin (MULTIVITAMIN) capsule Take 1 capsule by mouth daily.        . Nutritional Supplements (GLUCOSAMINE COMPLEX PO) Take 2 tablets by mouth daily.       . ondansetron (ZOFRAN ODT) 4 MG disintegrating tablet Take 4 mg by mouth every 12 (twelve) hours as needed.        . promethazine (PHENERGAN) 25 MG tablet Take 25 mg by mouth every 8 (eight) hours as needed.        . valACYclovir (VALTREX) 500 MG tablet Take 1 tablet (500 mg total) by mouth daily as needed. for break out  45 tablet  3  . vitamin E 400 UNIT capsule Take 400 Units by mouth daily.        Marland Kitchen DISCONTD: amLODipine (NORVASC) 5 MG tablet Take 1 tablet (5 mg total) by mouth daily.  90 tablet  3  . DISCONTD: fluticasone (FLONASE) 50 MCG/ACT nasal spray Place 2 sprays into the nose daily.  16 g  3  . DISCONTD: levothyroxine (SYNTHROID, LEVOTHROID) 125 MCG tablet Take 1 tablet (125 mcg total) by mouth daily.  30 tablet  0     Review of Systems Review of Systems  Constitutional: Negative for fever, appetite change, fatigue and unexpected weight change.  Eyes: Negative for pain and visual disturbance.  Respiratory: Negative for cough and shortness of breath.   Cardiovascular: Negative for cp or palpitations    Gastrointestinal: Negative for nausea, diarrhea and constipation.  Genitourinary: Negative for urgency and frequency.  Skin: Negative for pallor or rash   Neurological: Negative for weakness, light-headedness, numbness and headaches.  Hematological: Negative for adenopathy. Does not bruise/bleed easily.  Psychiatric/Behavioral: Negative for dysphoric mood. The patient is not nervous/anxious.         Objective:   Physical Exam  Constitutional: She appears well-developed and well-nourished. No distress.  HENT:  Head:  Normocephalic and atraumatic.  Right Ear: External ear normal.  Left Ear: External ear normal.  Nose: Nose normal.  Eyes: Conjunctivae and EOM are normal. Pupils are equal, round, and reactive to light. No scleral icterus.  Neck: Normal range of motion. Neck supple. No JVD present. Carotid bruit is not present. No thyromegaly present.  Cardiovascular: Normal rate, regular rhythm, normal heart sounds and intact distal pulses.  Exam reveals no gallop.   Pulmonary/Chest: Effort normal and breath sounds normal. No respiratory distress. She has no wheezes. She exhibits no tenderness.  Abdominal: Soft. Bowel sounds are normal. She exhibits no distension, no abdominal bruit and no mass. There is no tenderness.  Genitourinary: Vagina normal. No breast swelling, tenderness, discharge or bleeding. There is no rash, tenderness or lesion on the right labia. There is no rash, tenderness or lesion on the left labia. No bleeding around the vagina. No vaginal discharge found.       Breast exam: No mass, nodules, thickening, tenderness, bulging, retraction, inflamation, nipple discharge or skin changes noted.  No axillary or clavicular LA.  Chaperoned exam.    Uterus and cervix surg absent  Ovaries are not palpable   Musculoskeletal: Normal range of motion. She exhibits no edema and no tenderness.       No kyphosis   Lymphadenopathy:    She has no cervical adenopathy.  Neurological: She is alert. She has normal reflexes. No cranial nerve deficit. She exhibits normal muscle tone. Coordination normal.  Skin: Skin is warm and dry. No rash noted. No erythema. No pallor.  Psychiatric: She has a normal mood and affect.          Assessment & Plan:

## 2011-09-03 NOTE — Patient Instructions (Signed)
Pap today Labs stable Try to get at least 1200-1500 mg of calcium daily with 2000 iu extra of vitamin D for your bone health  Keep exercising

## 2011-09-03 NOTE — Assessment & Plan Note (Signed)
bp in fair control at this time  No changes needed  Disc lifstyle change with low sodium diet and exercise   

## 2011-09-03 NOTE — Assessment & Plan Note (Signed)
Discussed how this problem influences overall health and the risks it imposes  Reviewed plan for weight loss with lower calorie diet (via better food choices and also portion control or program like weight watchers) and exercise building up to or more than 30 minutes 5 days per week including some aerobic activity    

## 2011-09-11 ENCOUNTER — Encounter: Payer: Self-pay | Admitting: *Deleted

## 2012-02-15 ENCOUNTER — Other Ambulatory Visit: Payer: Self-pay

## 2012-02-15 NOTE — Telephone Encounter (Signed)
Pt request refill Valtrex to [facility]. Pt request 3 month rx.pt has enough med to wait for refill request until Dr Royden Purl return.

## 2012-02-17 MED ORDER — VALACYCLOVIR HCL 500 MG PO TABS: 500.0000 mg | ORAL_TABLET | Freq: Every day | ORAL | Status: DC | PRN

## 2012-02-17 NOTE — Telephone Encounter (Signed)
Will refill electronically  

## 2012-02-18 NOTE — Telephone Encounter (Signed)
Left voicemail letting pt know Rx was sent 

## 2012-04-16 HISTORY — PX: KNEE ARTHROSCOPY W/ MENISCAL REPAIR: SHX1877

## 2012-04-18 ENCOUNTER — Telehealth: Payer: Self-pay

## 2012-04-18 NOTE — Telephone Encounter (Signed)
04/10/12 pt started with productive cough with yellow mucus, head and chest congestion,raw throat,no SOB,no wheezing, no pain when breathes, no fevers or aches. Pt has been taking Mucinex and Delsym with no relief. [facility] [address]. Pt does not want to come in to office; husband recently had surgery and pt afraid will get more germs. [facility] not [facility]; prefers med sent to pharmacy. Please advise.

## 2012-04-18 NOTE — Telephone Encounter (Signed)
My best recommendation is fluids/ rest and mucinex DM for cough (there is a maximum strength product to try)  F/u if worse or not improving next week

## 2012-04-18 NOTE — Telephone Encounter (Signed)
Pt notified of Dr. Royden Purl recommendations and to f/u if sxs don't improve

## 2012-06-02 ENCOUNTER — Ambulatory Visit (INDEPENDENT_AMBULATORY_CARE_PROVIDER_SITE_OTHER): Payer: 59 | Admitting: Family Medicine

## 2012-06-02 ENCOUNTER — Encounter: Payer: Self-pay | Admitting: Family Medicine

## 2012-06-02 VITALS — BP 144/76 | HR 63 | Temp 98.3°F | Ht 64.0 in | Wt 166.8 lb

## 2012-06-02 DIAGNOSIS — J019 Acute sinusitis, unspecified: Secondary | ICD-10-CM

## 2012-06-02 DIAGNOSIS — B9689 Other specified bacterial agents as the cause of diseases classified elsewhere: Secondary | ICD-10-CM

## 2012-06-02 MED ORDER — AMOXICILLIN-POT CLAVULANATE 875-125 MG PO TABS: 1.0000 | ORAL_TABLET | Freq: Two times a day (BID) | ORAL | Status: DC

## 2012-06-02 MED ORDER — GUAIFENESIN-CODEINE 100-10 MG/5ML PO SYRP: 5.0000 mL | ORAL_SOLUTION | Freq: Four times a day (QID) | ORAL | Status: DC | PRN

## 2012-06-02 NOTE — Patient Instructions (Addendum)
Drink lots of fluids and get rest  Take the augmentin for sinus infection as directed  Try the cough medicine with caution-it will sedate  Update if not starting to improve in a week or if worsening   Schedule PE after May 20th (any 30 min slot) with labs prior

## 2012-06-02 NOTE — Assessment & Plan Note (Signed)
overw 6 weeks after uri with persistent cough tx with augmentin (pt has tolerated this in the past) Also robitussin ac for cough Disc symptomatic care - see instructions on AVS  Update if not starting to improve in a week or if worsening

## 2012-06-02 NOTE — Progress Notes (Signed)
Subjective:    Patient ID: Meghan Dixon, female    DOB: 29-Apr-1945, 67 y.o.   MRN: [id number]  HPI Here with uri symptoms   Has had symptoms since dec 30th  Terrible prod cough and nasal congestion for 2 weeks  She tried mucinex otc and that helped a lot  Got better and then got worse  Cough is back - with soreness in chest and back  Some chills (no fever right now) Phlegm is light yellow-that is improved  Used afrin over the weekend - 3 days -- then stopped it - that helped a lot  Some pressure in face -not too painful   Nyquil/ day quil -limited help     Patient Active Problem List  Diagnosis  . HSV  . HYPERCHOLESTEROLEMIA  . TENSION HEADACHE  . MIGRAINE HEADACHE  . SYNDROME, CARPAL TUNNEL  . BENIGN POSITIONAL VERTIGO  . ESSENTIAL HYPERTENSION  . ALLERGIC RHINITIS  . FIBROCYSTIC BREAST DISEASE  . BACK PAIN, LUMBAR  . SCIATICA, LEFT  . TROCHANTERIC BURSITIS, LEFT  . ILIOTIBIAL BAND SYNDROME, LEFT KNEE  . LEG PAIN, LEFT  . OSTEOPENIA  . Routine general medical examination at a health care facility  . Hypothyroid  . Swelling, mass, or lump in head and neck  . Obesity  . Vitamin d deficiency  . Gynecological examination   Past Medical History  Diagnosis Date  . Allergy     allergic rhinitis  . Osteopenia   . Hypertension   . HSV infection   . Hyperthyroidism   . Arthritis    Past Surgical History  Procedure Laterality Date  . Abdominal hysterectomy      for bleeding and ovarian cyst  . Appendectomy    . Tonsillectomy    . Carpal tunnel release      left  . Foot surgery      bilateral   History  Substance Use Topics  . Smoking status: Former Smoker    Quit date: 04/16/1984  . Smokeless tobacco: Never Used  . Alcohol Use: No   Family History  Problem Relation Age of Onset  . Heart disease Mother   . Cancer Father     esophageal CA  . Alcohol abuse Father   . Arthritis Sister     RA  . Heart disease Brother     CAD  . Colon cancer Neg Hx    . Stomach cancer Neg Hx    Allergies  Allergen Reactions  . Beta Adrenergic Blockers     REACTION: reaction not known  . Cefuroxime Axetil Hives    Throat closes  . Cetirizine Hcl     REACTION: dizzy  . Fexofenadine     REACTION: dizzy  . Flonase (Fluticasone Propionate) Other (See Comments)    dizziness  . Prednisone     REACTION: reaction not known   Current Outpatient Prescriptions on File Prior to Visit  Medication Sig Dispense Refill  . acyclovir (ZOVIRAX) 5 % cream Apply 1 application topically every 3 (three) hours. Apply to affected area 5 times daily as needed for breakout.  15 g  3  . amLODipine (NORVASC) 5 MG tablet Take 1 tablet (5 mg total) by mouth daily.  90 tablet  3  . calcium carbonate (OS-CAL) 600 MG TABS Take 1,200 mg by mouth daily.        Marland Kitchen eletriptan (RELPAX) 40 MG tablet One tablet by mouth as needed for migraine headache.  If the headache improves and then  returns, dose may be repeated after 2 hours have elapsed since first dose (do not exceed 80 mg per day). 1 tablet by mouth once a day as needed.  27 tablet  3  . fluticasone (FLONASE) 50 MCG/ACT nasal spray Place 2 sprays into the nose daily.  48 g  3  . GuaiFENesin (MUCINEX PO) Take by mouth as directed.        Marland Kitchen levothyroxine (SYNTHROID, LEVOTHROID) 125 MCG tablet Take 1 tablet (125 mcg total) by mouth daily.  90 tablet  3  . meclizine (ANTIVERT) 25 MG tablet Take 25-50 mg by mouth 3 (three) times daily as needed.        . Multiple Vitamin (MULTIVITAMIN) capsule Take 1 capsule by mouth daily.        . Nutritional Supplements (GLUCOSAMINE COMPLEX PO) Take 2 tablets by mouth daily.       . ondansetron (ZOFRAN ODT) 4 MG disintegrating tablet Take 4 mg by mouth every 12 (twelve) hours as needed.        . promethazine (PHENERGAN) 25 MG tablet Take 25 mg by mouth every 8 (eight) hours as needed.        . valACYclovir (VALTREX) 500 MG tablet Take 1 tablet (500 mg total) by mouth daily as needed. for break out   90 tablet  3   No current facility-administered medications on file prior to visit.      Review of Systems Review of Systems  Constitutional: Negative for fever, appetite change,  and unexpected weight change.  ENT pos for sinus pain/ neg for ear pain or drainage or st Eyes: Negative for pain and visual disturbance.  Respiratory: Negative for wheeze  and shortness of breath.   Cardiovascular: Negative for cp or palpitations    Gastrointestinal: Negative for nausea, diarrhea and constipation.  Genitourinary: Negative for urgency and frequency.  Skin: Negative for pallor or rash   Neurological: Negative for weakness, light-headedness, numbness and headaches.  Hematological: Negative for adenopathy. Does not bruise/bleed easily.  Psychiatric/Behavioral: Negative for dysphoric mood. The patient is not nervous/anxious.         Objective:   Physical Exam  Constitutional: She appears well-developed and well-nourished. No distress.  HENT:  Head: Normocephalic and atraumatic.  Right Ear: External ear normal.  Left Ear: External ear normal.  Mouth/Throat: Oropharynx is clear and moist. No oropharyngeal exudate.  Nares are injected and congested  bilat ethmoid and frontal sinus tenderness  Eyes: Conjunctivae and EOM are normal. Pupils are equal, round, and reactive to light. Right eye exhibits no discharge. Left eye exhibits no discharge.  Neck: Normal range of motion. Neck supple.  Cardiovascular: Normal rate and regular rhythm.  Exam reveals no gallop.   Pulmonary/Chest: Effort normal and breath sounds normal. No respiratory distress. She has no wheezes. She has no rales. She exhibits no tenderness.  Lymphadenopathy:    She has no cervical adenopathy.  Neurological: She is alert. No cranial nerve deficit.  Skin: Skin is warm and dry. No rash noted.  Psychiatric: She has a normal mood and affect.          Assessment & Plan:

## 2012-06-10 ENCOUNTER — Encounter: Payer: Self-pay | Admitting: Family Medicine

## 2012-06-10 ENCOUNTER — Ambulatory Visit (INDEPENDENT_AMBULATORY_CARE_PROVIDER_SITE_OTHER)
Admission: RE | Admit: 2012-06-10 | Discharge: 2012-06-10 | Disposition: A | Discharge status: Home / Self Care | Payer: 59 | Source: Ambulatory Visit | Attending: Family Medicine | Admitting: Family Medicine

## 2012-06-10 ENCOUNTER — Ambulatory Visit (HOSPITAL_COMMUNITY): Payer: 59

## 2012-06-10 ENCOUNTER — Ambulatory Visit (INDEPENDENT_AMBULATORY_CARE_PROVIDER_SITE_OTHER): Payer: 59 | Admitting: Family Medicine

## 2012-06-10 VITALS — BP 112/70 | HR 67 | Temp 98.7°F | Ht 64.0 in | Wt 169.0 lb

## 2012-06-10 DIAGNOSIS — R079 Chest pain, unspecified: Secondary | ICD-10-CM

## 2012-06-10 DIAGNOSIS — M542 Cervicalgia: Secondary | ICD-10-CM | POA: Insufficient documentation

## 2012-06-10 DIAGNOSIS — R05 Cough: Secondary | ICD-10-CM

## 2012-06-10 DIAGNOSIS — R059 Cough, unspecified: Secondary | ICD-10-CM

## 2012-06-10 NOTE — Assessment & Plan Note (Signed)
Suspect muscular Nl exam and EKG Recommend heat/ stretching and cervical support pillow Massage of trapezius region

## 2012-06-10 NOTE — Assessment & Plan Note (Signed)
Likely pt has caught another viral uri (sinusitis is better with augmentin) cxr today  Update if not starting to improve in a week or if worsening   Disc symptomatic care - see instructions on AVS;t

## 2012-06-10 NOTE — Assessment & Plan Note (Signed)
Suspect rel to uri and poss strain from cough Neg EKG cxr done  Atypical Update if not starting to improve in a week or if worsening

## 2012-06-10 NOTE — Progress Notes (Signed)
Subjective:    Patient ID: Meghan Dixon, female    DOB: 1945/07/08, 67 y.o.   MRN: [id number]  HPI Was here for sinusitis on 2/17- given augmentin  Got better over the weekend and then symptoms worsened again  Chest is tight- prod cough clear to cloudy  Felt a bit feverish - no fever however ST is moderate - with post nasal drip - is deep and burning   2 more days to go on augmentin  Not wheezing  Sinus pain and pressure is improved  Nose is pretty clear now    This am - had pain in her R arm and neck- that is a lot better now - just tight  A deep ache Hurt to move neck Tight in chest Not exertional  No diaphoresis or nausea   Patient Active Problem List  Diagnosis  . HSV  . HYPERCHOLESTEROLEMIA  . TENSION HEADACHE  . MIGRAINE HEADACHE  . SYNDROME, CARPAL TUNNEL  . BENIGN POSITIONAL VERTIGO  . ESSENTIAL HYPERTENSION  . ALLERGIC RHINITIS  . FIBROCYSTIC BREAST DISEASE  . BACK PAIN, LUMBAR  . SCIATICA, LEFT  . TROCHANTERIC BURSITIS, LEFT  . ILIOTIBIAL BAND SYNDROME, LEFT KNEE  . LEG PAIN, LEFT  . OSTEOPENIA  . Routine general medical examination at a health care facility  . Hypothyroid  . Swelling, mass, or lump in head and neck  . Obesity  . Vitamin d deficiency  . Gynecological examination  . Acute bacterial sinusitis  . Chest pain  . Neck pain on right side  . Cough   Past Medical History  Diagnosis Date  . Allergy     allergic rhinitis  . Osteopenia   . Hypertension   . HSV infection   . Hyperthyroidism   . Arthritis    Past Surgical History  Procedure Laterality Date  . Abdominal hysterectomy      for bleeding and ovarian cyst  . Appendectomy    . Tonsillectomy    . Carpal tunnel release      left  . Foot surgery      bilateral   History  Substance Use Topics  . Smoking status: Former Smoker    Quit date: 04/16/1984  . Smokeless tobacco: Never Used  . Alcohol Use: No   Family History  Problem Relation Age of Onset  . Heart disease  Mother   . Cancer Father     esophageal CA  . Alcohol abuse Father   . Arthritis Sister     RA  . Heart disease Brother     CAD  . Colon cancer Neg Hx   . Stomach cancer Neg Hx    Allergies  Allergen Reactions  . Beta Adrenergic Blockers     REACTION: reaction not known  . Cefuroxime Axetil Hives    Throat closes  . Cetirizine Hcl     REACTION: dizzy  . Fexofenadine     REACTION: dizzy  . Flonase (Fluticasone Propionate) Other (See Comments)    dizziness  . Prednisone     REACTION: reaction not known   Current Outpatient Prescriptions on File Prior to Visit  Medication Sig Dispense Refill  . acyclovir (ZOVIRAX) 5 % cream Apply 1 application topically every 3 (three) hours. Apply to affected area 5 times daily as needed for breakout.  15 g  3  . amLODipine (NORVASC) 5 MG tablet Take 1 tablet (5 mg total) by mouth daily.  90 tablet  3  . Ascorbic Acid (VITAMIN C)  100 MG tablet Take 200 mg by mouth daily.      . calcium carbonate (OS-CAL) 600 MG TABS Take 1,200 mg by mouth daily.        Marland Kitchen eletriptan (RELPAX) 40 MG tablet One tablet by mouth as needed for migraine headache.  If the headache improves and then returns, dose may be repeated after 2 hours have elapsed since first dose (do not exceed 80 mg per day). 1 tablet by mouth once a day as needed.  27 tablet  3  . fluticasone (FLONASE) 50 MCG/ACT nasal spray Place 2 sprays into the nose daily.  48 g  3  . GuaiFENesin (MUCINEX PO) Take by mouth as directed.        Marland Kitchen guaiFENesin-codeine (ROBITUSSIN AC) 100-10 MG/5ML syrup Take 5 mLs by mouth 4 (four) times daily as needed for cough.  120 mL  0  . levothyroxine (SYNTHROID, LEVOTHROID) 125 MCG tablet Take 1 tablet (125 mcg total) by mouth daily.  90 tablet  3  . meclizine (ANTIVERT) 25 MG tablet Take 25-50 mg by mouth 3 (three) times daily as needed.        . Multiple Vitamin (MULTIVITAMIN) capsule Take 1 capsule by mouth daily.        . Nutritional Supplements (GLUCOSAMINE COMPLEX  PO) Take 2 tablets by mouth daily.       . ondansetron (ZOFRAN ODT) 4 MG disintegrating tablet Take 4 mg by mouth every 12 (twelve) hours as needed.        . promethazine (PHENERGAN) 25 MG tablet Take 25 mg by mouth every 8 (eight) hours as needed.        . valACYclovir (VALTREX) 500 MG tablet Take 1 tablet (500 mg total) by mouth daily as needed. for break out  90 tablet  3   No current facility-administered medications on file prior to visit.      Review of Systems Review of Systems  Constitutional: Negative for fever, appetite change, and unexpected weight change.  Eyes: Negative for pain and visual disturbance.  ENt neg for sinus pain/ ear pain or drainage Respiratory: Negative for wheeze  and shortness of breath.   Cardiovascular: Negative for  palpitations   or pnd/ orthopnea or edema Gastrointestinal: Negative for nausea, diarrhea and constipation.  Genitourinary: Negative for urgency and frequency.  Skin: Negative for pallor or rash   MSK pos for neck and shoulder soreness Neurological: Negative for weakness, light-headedness, numbness and headaches.  Hematological: Negative for adenopathy. Does not bruise/bleed easily.  Psychiatric/Behavioral: Negative for dysphoric mood. The patient is not nervous/anxious.         Objective:   Physical Exam  Constitutional: She appears well-developed and well-nourished. No distress.  HENT:  Head: Normocephalic and atraumatic.  Right Ear: External ear normal.  Left Ear: External ear normal.  Mouth/Throat: Oropharynx is clear and moist. No oropharyngeal exudate.  Nares are boggy but clear No sinus tenderness Clear post nasal drip  Eyes: Conjunctivae and EOM are normal. Pupils are equal, round, and reactive to light. Right eye exhibits no discharge. Left eye exhibits no discharge. No scleral icterus.  Neck: Normal range of motion. Neck supple. No JVD present. Carotid bruit is not present. No thyromegaly present.  Cardiovascular: Normal  rate, regular rhythm, normal heart sounds and intact distal pulses.  Exam reveals no gallop.   Pulmonary/Chest: Effort normal and breath sounds normal. No respiratory distress. She has no wheezes. She has no rales. She exhibits no tenderness.  Abdominal: Soft.  Bowel sounds are normal. She exhibits no distension, no abdominal bruit and no mass. There is no tenderness.  Musculoskeletal: She exhibits no edema.       Cervical back: She exhibits tenderness and spasm. She exhibits normal range of motion, no bony tenderness, no swelling and no edema.       Right upper arm: She exhibits no tenderness, no bony tenderness, no swelling and no deformity.  Some pain on internal rotation of shoulder Otherwise nl rom No cw tenderness  Lymphadenopathy:    She has no cervical adenopathy.  Neurological: She is alert. She has normal reflexes. She displays no atrophy. No cranial nerve deficit or sensory deficit. She exhibits normal muscle tone. Coordination normal.  Skin: Skin is warm and dry. No rash noted. No erythema. No pallor.  Psychiatric: She has a normal mood and affect.          Assessment & Plan:

## 2012-06-10 NOTE — Patient Instructions (Addendum)
Use heat on the area between neck and shoulder and stretch it out frequently Think about getting a foam pillow Chest xray today - if this is normal you could have caught another cold- and symptomatic care is ok Finish the augmentin EKG is normal-that is reassuring, but if more symptoms, let me know

## 2012-06-18 ENCOUNTER — Other Ambulatory Visit: Payer: Self-pay | Admitting: *Deleted

## 2012-06-18 MED ORDER — ELETRIPTAN HYDROBROMIDE 40 MG PO TABS: 40.0000 mg | ORAL_TABLET | Freq: Every day | ORAL | Status: DC | PRN

## 2012-06-18 NOTE — Telephone Encounter (Signed)
Pt left VM asking for refill to be sent to [facility], please advise

## 2012-06-18 NOTE — Telephone Encounter (Signed)
Will refill electronically  

## 2012-06-18 NOTE — Telephone Encounter (Signed)
Pt's husband notified Rx was sent to [facility]

## 2012-08-14 ENCOUNTER — Other Ambulatory Visit: Payer: Self-pay | Admitting: Family Medicine

## 2012-08-14 ENCOUNTER — Other Ambulatory Visit: Payer: Self-pay

## 2012-08-14 DIAGNOSIS — Z1231 Encounter for screening mammogram for malignant neoplasm of breast: Secondary | ICD-10-CM

## 2012-08-27 ENCOUNTER — Telehealth: Payer: Self-pay | Admitting: Family Medicine

## 2012-08-27 DIAGNOSIS — M899 Disorder of bone, unspecified: Secondary | ICD-10-CM

## 2012-08-27 DIAGNOSIS — Z0000 Encounter for general adult medical examination without abnormal findings: Secondary | ICD-10-CM

## 2012-08-27 DIAGNOSIS — E7800 Pure hypercholesterolemia, unspecified: Secondary | ICD-10-CM

## 2012-08-27 DIAGNOSIS — E559 Vitamin D deficiency, unspecified: Secondary | ICD-10-CM

## 2012-08-27 NOTE — Telephone Encounter (Signed)
Message copied by Judy Pimple on Wed Aug 27, 2012 10:05 PM ------      Message from: Alvina Chou      Created: Fri Aug 15, 2012  9:57 AM      Regarding: Lab orders for Friday, 5.16.14       Patient is scheduled for CPX labs, please order future labs, Thanks , Terri       ------

## 2012-08-29 ENCOUNTER — Other Ambulatory Visit (INDEPENDENT_AMBULATORY_CARE_PROVIDER_SITE_OTHER): Payer: 59

## 2012-08-29 ENCOUNTER — Ambulatory Visit
Admission: RE | Admit: 2012-08-29 | Discharge: 2012-08-29 | Disposition: A | Discharge status: Home / Self Care | Payer: 59 | Source: Ambulatory Visit

## 2012-08-29 DIAGNOSIS — E559 Vitamin D deficiency, unspecified: Secondary | ICD-10-CM

## 2012-08-29 DIAGNOSIS — Z1231 Encounter for screening mammogram for malignant neoplasm of breast: Secondary | ICD-10-CM

## 2012-08-29 DIAGNOSIS — E7800 Pure hypercholesterolemia, unspecified: Secondary | ICD-10-CM

## 2012-08-29 DIAGNOSIS — M899 Disorder of bone, unspecified: Secondary | ICD-10-CM

## 2012-08-29 DIAGNOSIS — Z0000 Encounter for general adult medical examination without abnormal findings: Secondary | ICD-10-CM

## 2012-08-29 LAB — LIPID PANEL
HDL: 60.6 mg/dL (ref >39.00)
LDL Cholesterol: 131 mg/dL — ABNORMAL HIGH (ref 0–99)
Total CHOL/HDL Ratio: 3
Triglycerides: 44 mg/dL (ref 0.0–149.0)

## 2012-08-29 LAB — CBC WITH DIFFERENTIAL/PLATELET
Basophils Relative: 0.6 % (ref 0.0–3.0)
Eosinophils Absolute: 0.2 10*3/uL (ref 0.0–0.7)
Eosinophils Relative: 3.7 % (ref 0.0–5.0)
Hemoglobin: 13.1 g/dL (ref 12.0–15.0)
MCHC: 33.8 g/dL (ref 30.0–36.0)
MCV: 91.3 fl (ref 78.0–100.0)
Monocytes Absolute: 0.6 10*3/uL (ref 0.1–1.0)
Neutro Abs: 3.3 10*3/uL (ref 1.4–7.7)
Neutrophils Relative %: 55.7 % (ref 43.0–77.0)
RBC: 4.25 Mil/uL (ref 3.87–5.11)
WBC: 6 10*3/uL (ref 4.5–10.5)

## 2012-08-29 LAB — TSH: TSH: 5.41 u[IU]/mL (ref 0.35–5.50)

## 2012-09-01 ENCOUNTER — Encounter: Payer: Self-pay | Admitting: *Deleted

## 2012-09-05 ENCOUNTER — Ambulatory Visit (INDEPENDENT_AMBULATORY_CARE_PROVIDER_SITE_OTHER): Payer: 59 | Admitting: Family Medicine

## 2012-09-05 ENCOUNTER — Encounter: Payer: Self-pay | Admitting: Family Medicine

## 2012-09-05 VITALS — BP 124/86 | HR 70 | Temp 98.4°F | Ht 64.25 in | Wt 162.8 lb

## 2012-09-05 DIAGNOSIS — E559 Vitamin D deficiency, unspecified: Secondary | ICD-10-CM

## 2012-09-05 DIAGNOSIS — M949 Disorder of cartilage, unspecified: Secondary | ICD-10-CM

## 2012-09-05 DIAGNOSIS — Z0000 Encounter for general adult medical examination without abnormal findings: Secondary | ICD-10-CM

## 2012-09-05 DIAGNOSIS — E039 Hypothyroidism, unspecified: Secondary | ICD-10-CM

## 2012-09-05 DIAGNOSIS — E7800 Pure hypercholesterolemia, unspecified: Secondary | ICD-10-CM

## 2012-09-05 DIAGNOSIS — M899 Disorder of bone, unspecified: Secondary | ICD-10-CM

## 2012-09-05 MED ORDER — AMLODIPINE BESYLATE 5 MG PO TABS: 5.0000 mg | ORAL_TABLET | Freq: Every day | ORAL | Status: DC

## 2012-09-05 MED ORDER — FLUTICASONE PROPIONATE 50 MCG/ACT NA SUSP: 2.0000 | Freq: Every day | NASAL | Status: DC

## 2012-09-05 MED ORDER — LEVOTHYROXINE SODIUM 125 MCG PO TABS: 125.0000 ug | ORAL_TABLET | Freq: Every day | ORAL | Status: DC

## 2012-09-05 NOTE — Patient Instructions (Addendum)
Avoid red meat/ fried foods/ egg yolks/ fatty breakfast meats/ butter, cheese and high fat dairy/ and shellfish   Stop up front to schedule your July bone density test Get back on vitamin D Take care of yourself

## 2012-09-05 NOTE — Assessment & Plan Note (Signed)
dexa scheduled for July D level 31- will get back on her D  Has a stress fx in knee Disc imp of wt bearing exercise when cleared to do so

## 2012-09-05 NOTE — Assessment & Plan Note (Signed)
Pt will get back on her vit D  Disc imp to bone and overall health

## 2012-09-05 NOTE — Assessment & Plan Note (Signed)
Hypothyroidism  Pt has no clinical changes No change in energy level/ hair or skin/ edema and no tremor Lab Results  Component Value Date   TSH 5.41 08/29/2012

## 2012-09-05 NOTE — Assessment & Plan Note (Signed)
Reviewed health habits including diet and exercise and skin cancer prevention Also reviewed health mt list, fam hx and immunizations   Labs reviewed  

## 2012-09-05 NOTE — Progress Notes (Signed)
Subjective:    Patient ID: Meghan Dixon, female    DOB: 02/15/1946, 67 y.o.   MRN: [id number]  HPI Here for health maintenance exam and to review chronic medical problems    Doing well overall  Has a tear in meniscus of R knee- with stress fracture - has to be off of it for 1 mo - and will use a cane   Wt is down 7 lb with bmi of 27 Is doing very well with diet - happy with that   Flu vaccine-did not get one this season   Mammogram 5/14 Self exam- no lumps or changes  Pap 5/13 Has had hysterectomy Td 5/12 colonosc 1/13 normal - 10 year recall Zoster vaccine8/12  Osteopenia dexa 6/12 D level is 31- up 2 pts  Was not taken her vitamin D regularly -will start   Hyperlipidemia Lab Results  Component Value Date   CHOL 200 08/29/2012   CHOL 181 08/27/2011   CHOL 200 08/24/2010   Lab Results  Component Value Date   HDL 60.60 08/29/2012   HDL 40.98 08/27/2011   HDL 11.91 08/24/2010   Lab Results  Component Value Date   LDLCALC 131* 08/29/2012   LDLCALC 105* 08/27/2011   LDLCALC 126* 08/24/2010   Lab Results  Component Value Date   TRIG 44.0 08/29/2012   TRIG 94.0 08/27/2011   TRIG 50.0 08/24/2010   Lab Results  Component Value Date   CHOLHDL 3 08/29/2012   CHOLHDL 3 08/27/2011   CHOLHDL 3 08/24/2010   Lab Results  Component Value Date   LDLDIRECT 118.5 06/05/2006   diet has been good but eats too much red meat   Lab Results  Component Value Date   TSH 5.41 08/29/2012     Patient Active Problem List   Diagnosis Date Noted  . Unspecified vitamin D deficiency 09/03/2011  . Gynecological examination 09/03/2011  . Hypothyroid 08/29/2010  . Routine general medical examination at a health care facility 08/22/2010  . BACK PAIN, LUMBAR 08/29/2009  . SCIATICA, LEFT 08/29/2009  . HYPERCHOLESTEROLEMIA 08/22/2009  . BENIGN POSITIONAL VERTIGO 03/23/2009  . ESSENTIAL HYPERTENSION 05/27/2007  . 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 TENSION HEADACHE 10/21/2006  . MIGRAINE HEADACHE 10/21/2006   . SYNDROME, CARPAL TUNNEL 10/21/2006  . ALLERGIC RHINITIS 10/21/2006  . FIBROCYSTIC BREAST DISEASE 10/21/2006  . OSTEOPENIA 10/21/2006   Past Medical History  Diagnosis Date  . Allergy     allergic rhinitis  . Osteopenia   . Hypertension   . HSV infection   . Hyperthyroidism   . Arthritis    Past Surgical History  Procedure Laterality Date  . Abdominal hysterectomy      for bleeding and ovarian cyst  . Appendectomy    . Tonsillectomy    . Carpal tunnel release      left  . Foot surgery      bilateral   History  Substance Use Topics  . Smoking status: Former Smoker    Quit date: 04/16/1984  . Smokeless tobacco: Never Used  . Alcohol Use: No   Family History  Problem Relation Age of Onset  . Heart disease Mother   . Cancer Father     esophageal CA  . Alcohol abuse Father   . Arthritis Sister     RA  . Heart disease Brother     CAD  . Colon cancer Neg Hx   . Stomach cancer Neg Hx    Allergies  Allergen Reactions  . Beta Adrenergic  Blockers     REACTION: reaction not known  . Cefuroxime Axetil Hives    Throat closes  . Cetirizine Hcl     REACTION: dizzy  . Fexofenadine     REACTION: dizzy  . Flonase (Fluticasone Propionate) Other (See Comments)    dizziness  . Prednisone     REACTION: reaction not known   Current Outpatient Prescriptions on File Prior to Visit  Medication Sig Dispense Refill  . acyclovir (ZOVIRAX) 5 % cream Apply 1 application topically every 3 (three) hours. Apply to affected area 5 times daily as needed for breakout.  15 g  3  . Ascorbic Acid (VITAMIN C) 100 MG tablet Take 200 mg by mouth 2 (two) times a week.       . calcium carbonate (OS-CAL) 600 MG TABS Take 1,200 mg by mouth daily.       Marland Kitchen eletriptan (RELPAX) 40 MG tablet One tablet by mouth at onset of headache. May repeat in 2 hours if headache persists or recurs.  27 tablet  3  . meclizine (ANTIVERT) 25 MG tablet Take 25-50 mg by mouth 3 (three) times daily as needed.         . Multiple Vitamin (MULTIVITAMIN) capsule Take 1 capsule by mouth daily.        . Nutritional Supplements (GLUCOSAMINE COMPLEX PO) Take 2 tablets by mouth daily.       . ondansetron (ZOFRAN ODT) 4 MG disintegrating tablet Take 4 mg by mouth every 12 (twelve) hours as needed.        . promethazine (PHENERGAN) 25 MG tablet Take 25 mg by mouth every 8 (eight) hours as needed.        . valACYclovir (VALTREX) 500 MG tablet Take 1 tablet (500 mg total) by mouth daily as needed. for break out  90 tablet  3   No current facility-administered medications on file prior to visit.    Review of Systems Review of Systems  Constitutional: Negative for fever, appetite change, fatigue and unexpected weight change.  Eyes: Negative for pain and visual disturbance.  Respiratory: Negative for cough and shortness of breath.   Cardiovascular: Negative for cp or palpitations    Gastrointestinal: Negative for nausea, diarrhea and constipation.  Genitourinary: Negative for urgency and frequency.  Skin: Negative for pallor or rash   MSK pos for knee pain R with meniscal tear and stress fx  Neurological: Negative for weakness, light-headedness, numbness and headaches.  Hematological: Negative for adenopathy. Does not bruise/bleed easily.  Psychiatric/Behavioral: Negative for dysphoric mood. The patient is not nervous/anxious.         Objective:   Physical Exam  Constitutional: She appears well-developed and well-nourished. No distress.  HENT:  Head: Normocephalic and atraumatic.  Right Ear: External ear normal.  Left Ear: External ear normal.  Nose: Nose normal.  Mouth/Throat: Oropharynx is clear and moist.  Eyes: Conjunctivae and EOM are normal. Pupils are equal, round, and reactive to light. Right eye exhibits no discharge. Left eye exhibits no discharge. No scleral icterus.  Neck: Normal range of motion. Neck supple. No JVD present. Carotid bruit is not present. No thyromegaly present.  Cardiovascular:  Normal rate, regular rhythm, normal heart sounds and intact distal pulses.  Exam reveals no gallop.   Pulmonary/Chest: Effort normal and breath sounds normal. No respiratory distress. She has no wheezes. She exhibits no tenderness.  Abdominal: Soft. Bowel sounds are normal. She exhibits no distension, no abdominal bruit and no mass. There is no tenderness.  Genitourinary: No breast swelling, tenderness, discharge or bleeding.  Breast exam: No mass, nodules, thickening, tenderness, bulging, retraction, inflamation, nipple discharge or skin changes noted.  No axillary or clavicular LA.  Chaperoned exam.    Musculoskeletal: She exhibits no edema and no tenderness.  Lymphadenopathy:    She has no cervical adenopathy.  Neurological: She is alert. No cranial nerve deficit. She exhibits normal muscle tone. Coordination normal.  Skin: Skin is warm and dry. No rash noted. No erythema. No pallor.  Solar lentigos diffusely   Psychiatric: She has a normal mood and affect.          Assessment & Plan:

## 2012-09-05 NOTE — Assessment & Plan Note (Signed)
Disc goals for lipids and reasons to control them Rev labs with pt Rev low sat fat diet in detail Will dec sat fats to reduce LDL

## 2012-10-09 ENCOUNTER — Telehealth: Payer: Self-pay | Admitting: *Deleted

## 2012-10-09 NOTE — Telephone Encounter (Signed)
Received fax from [facility] saying pt needs pre-opt clearance, does pt need to come in for appt? Pt just had a CPE appt on 09/05/12, please advise  I will hold form until you return

## 2012-10-09 NOTE — Telephone Encounter (Signed)
She had a recent exam and EKG- so I think I can go ahead and clear her

## 2012-10-10 NOTE — Telephone Encounter (Signed)
Form placed in your inbox to complete when you return

## 2012-10-12 NOTE — Telephone Encounter (Signed)
Signed off on this and in IN box- please attach copy of last EKG-thanks

## 2012-10-13 ENCOUNTER — Ambulatory Visit
Admission: RE | Admit: 2012-10-13 | Discharge: 2012-10-13 | Disposition: A | Discharge status: Home / Self Care | Payer: 59 | Source: Ambulatory Visit | Attending: Family Medicine | Admitting: Family Medicine

## 2012-10-13 DIAGNOSIS — M899 Disorder of bone, unspecified: Secondary | ICD-10-CM

## 2012-10-13 LAB — HM DEXA SCAN

## 2012-10-13 NOTE — Telephone Encounter (Signed)
Form and EKG faxed.

## 2012-10-14 ENCOUNTER — Ambulatory Visit: Payer: 59 | Admitting: Family Medicine

## 2012-10-20 ENCOUNTER — Encounter: Payer: Self-pay | Admitting: *Deleted

## 2012-10-20 ENCOUNTER — Encounter: Payer: Self-pay | Admitting: Family Medicine

## 2012-10-29 ENCOUNTER — Telehealth: Payer: Self-pay

## 2012-10-29 NOTE — Telephone Encounter (Signed)
Please send those, thanks

## 2012-10-29 NOTE — Telephone Encounter (Signed)
Nettie Elm with [facility] (Anesthesia Dept) request last office notes, lab results and most recent EKG faxed to [phone]; pt scheduled for Knee arthroscopy on 11/03/12.Please advise.

## 2012-10-30 NOTE — Telephone Encounter (Signed)
Records faxed.

## 2013-01-27 ENCOUNTER — Ambulatory Visit: Payer: 59 | Admitting: Family Medicine

## 2013-01-30 ENCOUNTER — Ambulatory Visit (INDEPENDENT_AMBULATORY_CARE_PROVIDER_SITE_OTHER): Payer: Medicare Other | Admitting: Family Medicine

## 2013-01-30 ENCOUNTER — Encounter: Payer: Self-pay | Admitting: Family Medicine

## 2013-01-30 VITALS — BP 130/82 | HR 83 | Temp 98.0°F | Ht 64.25 in | Wt 173.8 lb

## 2013-01-30 DIAGNOSIS — R350 Frequency of micturition: Secondary | ICD-10-CM

## 2013-01-30 DIAGNOSIS — R300 Dysuria: Secondary | ICD-10-CM

## 2013-01-30 DIAGNOSIS — N393 Stress incontinence (female) (male): Secondary | ICD-10-CM

## 2013-01-30 LAB — POCT URINALYSIS DIPSTICK
Bilirubin, UA: NEGATIVE
Ketones, UA: NEGATIVE
Protein, UA: NEGATIVE
pH, UA: 7

## 2013-01-30 LAB — POCT UA - MICROSCOPIC ONLY: Yeast, UA: 0

## 2013-01-30 NOTE — Patient Instructions (Signed)
Do kegel exercises every chance you can  Physical therapy is an option  We will call with urine culture result when it is back  Then will make a plan based on how you are doing

## 2013-01-30 NOTE — Progress Notes (Signed)
Subjective:    Patient ID: Meghan Dixon, female    DOB: 23-Jul-1945, 67 y.o.   MRN: [id number]  HPI Here with a bladder/ urinary issue  Last Saturday - she bent over and had some "leakage"  Then again when she laid down to go to bed -it happened again  Started to wear small pads  Better through the week  This am-happened again   Does feel like she has more frequency No vag d/c  No burning  No blood in urine  No abn urine smell  occ a little back ache- nothing new  No fever or nausea   Never leaked when she coughed or sneezed  No urge incont symptoms    Patient Active Problem List   Diagnosis Date Noted  . Urinary, incontinence, stress female 01/30/2013  . Urinary frequency 01/30/2013  . Unspecified vitamin D deficiency 09/03/2011  . Gynecological examination 09/03/2011  . Hypothyroid 08/29/2010  . Routine general medical examination at a health care facility 08/22/2010  . BACK PAIN, LUMBAR 08/29/2009  . SCIATICA, LEFT 08/29/2009  . HYPERCHOLESTEROLEMIA 08/22/2009  . BENIGN POSITIONAL VERTIGO 03/23/2009  . ESSENTIAL HYPERTENSION 05/27/2007  . 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 TENSION HEADACHE 10/21/2006  . MIGRAINE HEADACHE 10/21/2006  . SYNDROME, CARPAL TUNNEL 10/21/2006  . ALLERGIC RHINITIS 10/21/2006  . FIBROCYSTIC BREAST DISEASE 10/21/2006  . OSTEOPENIA 10/21/2006   Past Medical History  Diagnosis Date  . Allergy     allergic rhinitis  . Osteopenia   . Hypertension   . HSV infection   . Hyperthyroidism   . Arthritis    Past Surgical History  Procedure Laterality Date  . Abdominal hysterectomy      for bleeding and ovarian cyst  . Appendectomy    . Tonsillectomy    . Carpal tunnel release      left  . Foot surgery      bilateral   History  Substance Use Topics  . Smoking status: Former Smoker    Quit date: 04/16/1984  . Smokeless tobacco: Never Used  . Alcohol Use: No   Family History  Problem Relation Age of Onset  . Heart disease Mother   . Cancer  Father     esophageal CA  . Alcohol abuse Father   . Arthritis Sister     RA  . Heart disease Brother     CAD  . Colon cancer Neg Hx   . Stomach cancer Neg Hx    Allergies  Allergen Reactions  . Beta Adrenergic Blockers     REACTION: reaction not known  . Cefuroxime Axetil Hives    Throat closes  . Cetirizine Hcl     REACTION: dizzy  . Fexofenadine     REACTION: dizzy  . Flonase [Fluticasone Propionate] Other (See Comments)    dizziness  . Prednisone     REACTION: reaction not known   Current Outpatient Prescriptions on File Prior to Visit  Medication Sig Dispense Refill  . acyclovir (ZOVIRAX) 5 % cream Apply 1 application topically every 3 (three) hours. Apply to affected area 5 times daily as needed for breakout.  15 g  3  . amLODipine (NORVASC) 5 MG tablet Take 1 tablet (5 mg total) by mouth daily.  90 tablet  3  . Ascorbic Acid (VITAMIN C) 100 MG tablet Take 200 mg by mouth 2 (two) times a week.       . calcium carbonate (OS-CAL) 600 MG TABS Take 1,200 mg by mouth daily.       Marland Kitchen  eletriptan (RELPAX) 40 MG tablet One tablet by mouth at onset of headache. May repeat in 2 hours if headache persists or recurs.  27 tablet  3  . fluticasone (FLONASE) 50 MCG/ACT nasal spray Place 2 sprays into the nose daily.  48 g  3  . levothyroxine (SYNTHROID, LEVOTHROID) 125 MCG tablet Take 1 tablet (125 mcg total) by mouth daily.  90 tablet  3  . meclizine (ANTIVERT) 25 MG tablet Take 25-50 mg by mouth 3 (three) times daily as needed.        . Multiple Vitamin (MULTIVITAMIN) capsule Take 1 capsule by mouth daily.        . Nutritional Supplements (GLUCOSAMINE COMPLEX PO) Take 2 tablets by mouth daily.       . ondansetron (ZOFRAN ODT) 4 MG disintegrating tablet Take 4 mg by mouth every 12 (twelve) hours as needed.        . promethazine (PHENERGAN) 25 MG tablet Take 25 mg by mouth every 8 (eight) hours as needed.        . valACYclovir (VALTREX) 500 MG tablet Take 1 tablet (500 mg total) by mouth  daily as needed. for break out  90 tablet  3   No current facility-administered medications on file prior to visit.    Review of Systems Review of Systems  Constitutional: Negative for fever, appetite change, fatigue and unexpected weight change.  Eyes: Negative for pain and visual disturbance.  Respiratory: Negative for cough and shortness of breath.   Cardiovascular: Negative for cp or palpitations    Gastrointestinal: Negative for nausea, diarrhea and constipation.  Genitourinary: Negative for frequency. pos for incontinence / neg for flank pain or dysuria  Skin: Negative for pallor or rash   Neurological: Negative for weakness, light-headedness, numbness and headaches.  Hematological: Negative for adenopathy. Does not bruise/bleed easily.  Psychiatric/Behavioral: Negative for dysphoric mood. The patient is not nervous/anxious.         Objective:   Physical Exam  Constitutional: She appears well-developed and well-nourished. No distress.  HENT:  Head: Normocephalic and atraumatic.  Nose: Nose normal.  Mouth/Throat: Oropharynx is clear and moist.  Eyes: Conjunctivae and EOM are normal. Pupils are equal, round, and reactive to light. No scleral icterus.  Neck: Normal range of motion. Neck supple. No thyromegaly present.  Cardiovascular: Normal rate, regular rhythm, normal heart sounds and intact distal pulses.  Exam reveals no gallop.   Pulmonary/Chest: Effort normal and breath sounds normal. No respiratory distress.  Abdominal: Soft. Bowel sounds are normal. She exhibits no distension and no mass. There is no tenderness.  Genitourinary: There is no rash, tenderness or lesion on the right labia. There is no rash, tenderness or lesion on the left labia. No erythema or bleeding around the vagina. No vaginal discharge found.  Urethra appears grossly normal  No cystocele evident No pain or mass on bimanual exam  Musculoskeletal: She exhibits no edema and no tenderness.   Lymphadenopathy:    She has no cervical adenopathy.  Neurological: She is alert. She has normal reflexes. No cranial nerve deficit. She exhibits normal muscle tone. Coordination normal.  Skin: Skin is warm and dry. No rash noted. No erythema. No pallor.  Psychiatric: She has a normal mood and affect.          Assessment & Plan:

## 2013-02-01 LAB — URINE CULTURE
Colony Count: NO GROWTH
Organism ID, Bacteria: NO GROWTH

## 2013-02-01 NOTE — Assessment & Plan Note (Signed)
Urine sent for cx Suspect poss overactive bladder Disc water intake/ minimize caff and other beverages Pt will work on kegel exercises ? Trial of med or PT if not imp

## 2013-02-01 NOTE — Assessment & Plan Note (Signed)
Urine sent for cx  Reassuring exam Disc use of kegel exercises PT is also an option  Will update if not imp

## 2013-02-19 ENCOUNTER — Telehealth: Payer: Self-pay

## 2013-02-19 NOTE — Telephone Encounter (Signed)
Pt left v/m was seen 01/30/13;pt having issues with medication due to insurance issues; pt still having urinary problem. When I called pt back she had already scheduled appt to see Dr Milinda Antis on 02/20/13 and pt said would discuss everything with Dr Milinda Antis then.

## 2013-02-20 ENCOUNTER — Ambulatory Visit (INDEPENDENT_AMBULATORY_CARE_PROVIDER_SITE_OTHER): Payer: Medicare Other | Admitting: Family Medicine

## 2013-02-20 ENCOUNTER — Encounter: Payer: Self-pay | Admitting: Family Medicine

## 2013-02-20 VITALS — BP 144/90 | HR 59 | Temp 97.3°F | Ht 64.25 in | Wt 173.8 lb

## 2013-02-20 DIAGNOSIS — N760 Acute vaginitis: Secondary | ICD-10-CM | POA: Insufficient documentation

## 2013-02-20 DIAGNOSIS — N898 Other specified noninflammatory disorders of vagina: Secondary | ICD-10-CM

## 2013-02-20 DIAGNOSIS — Z23 Encounter for immunization: Secondary | ICD-10-CM

## 2013-02-20 DIAGNOSIS — L293 Anogenital pruritus, unspecified: Secondary | ICD-10-CM

## 2013-02-20 LAB — POCT URINALYSIS DIPSTICK
Leukocytes, UA: NEGATIVE
Nitrite, UA: NEGATIVE
Protein, UA: NEGATIVE
Urobilinogen, UA: 0.2

## 2013-02-20 LAB — POCT WET PREP (WET MOUNT)
KOH Wet Prep POC: NEGATIVE
Trichomonas Wet Prep HPF POC: NEGATIVE

## 2013-02-20 MED ORDER — FLUCONAZOLE 150 MG PO TABS: 150.0000 mg | ORAL_TABLET | Freq: Once | ORAL | Status: DC

## 2013-02-20 MED ORDER — LEVOTHYROXINE SODIUM 125 MCG PO TABS: 125.0000 ug | ORAL_TABLET | Freq: Every day | ORAL | Status: DC

## 2013-02-20 NOTE — Progress Notes (Signed)
Pre-visit discussion using our clinic review tool. No additional management support is needed unless otherwise documented below in the visit note.  

## 2013-02-20 NOTE — Patient Instructions (Signed)
I think you have a vaginal yeast infection Take the diflucan pill Update if not starting to improve in a week or if worsening

## 2013-02-20 NOTE — Progress Notes (Signed)
Subjective:    Patient ID: Meghan Dixon, female    DOB: [birth year], 67 y.o.   MRN: [id number]  HPI Here with vaginal itching  A little burning - feels irritated  Little to no discharge  Also vaginal odor- worst in the am   Not wearing pads No longer having urine frequency   Last visit had urine cx neg   ua neg - clear    She has to change insurance plans   Needs thyroid med for mail order - will do that today  Patient Active Problem List   Diagnosis Date Noted  . Vaginitis 02/20/2013  . Urinary, incontinence, stress female 01/30/2013  . Urinary frequency 01/30/2013  . Unspecified vitamin D deficiency 09/03/2011  . Gynecological examination 09/03/2011  . Hypothyroid 08/29/2010  . Routine general medical examination at a health care facility 08/22/2010  . BACK PAIN, LUMBAR 08/29/2009  . SCIATICA, LEFT 08/29/2009  . HYPERCHOLESTEROLEMIA 08/22/2009  . BENIGN POSITIONAL VERTIGO 03/23/2009  . ESSENTIAL HYPERTENSION 05/27/2007  . 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 TENSION HEADACHE 10/21/2006  . MIGRAINE HEADACHE 10/21/2006  . SYNDROME, CARPAL TUNNEL 10/21/2006  . ALLERGIC RHINITIS 10/21/2006  . FIBROCYSTIC BREAST DISEASE 10/21/2006  . OSTEOPENIA 10/21/2006   Past Medical History  Diagnosis Date  . Allergy     allergic rhinitis  . Osteopenia   . Hypertension   . HSV infection   . Hyperthyroidism   . Arthritis    Past Surgical History  Procedure Laterality Date  . Abdominal hysterectomy      for bleeding and ovarian cyst  . Appendectomy    . Tonsillectomy    . Carpal tunnel release      left  . Foot surgery      bilateral   History  Substance Use Topics  . Smoking status: Former Smoker    Quit date: 04/16/1984  . Smokeless tobacco: Never Used  . Alcohol Use: No   Family History  Problem Relation Age of Onset  . Heart disease Mother   . Cancer Father     esophageal CA  . Alcohol abuse Father   . Arthritis Sister     RA  . Heart disease Brother     CAD  . Colon  cancer Neg Hx   . Stomach cancer Neg Hx    Allergies  Allergen Reactions  . Beta Adrenergic Blockers     REACTION: reaction not known  . Cefuroxime Axetil Hives    Throat closes  . Cetirizine Hcl     REACTION: dizzy  . Fexofenadine     REACTION: dizzy  . Flonase [Fluticasone Propionate] Other (See Comments)    dizziness  . Prednisone     REACTION: reaction not known   Current Outpatient Prescriptions on File Prior to Visit  Medication Sig Dispense Refill  . acyclovir (ZOVIRAX) 5 % cream Apply 1 application topically every 3 (three) hours. Apply to affected area 5 times daily as needed for breakout.  15 g  3  . amLODipine (NORVASC) 5 MG tablet Take 1 tablet (5 mg total) by mouth daily.  90 tablet  3  . Ascorbic Acid (VITAMIN C) 100 MG tablet Take 200 mg by mouth 2 (two) times a week.       . calcium carbonate (OS-CAL) 600 MG TABS Take 1,200 mg by mouth daily.       Marland Kitchen eletriptan (RELPAX) 40 MG tablet One tablet by mouth at onset of headache. May repeat in 2 hours if headache persists  or recurs.  27 tablet  3  . meclizine (ANTIVERT) 25 MG tablet Take 25-50 mg by mouth 3 (three) times daily as needed.        . Multiple Vitamin (MULTIVITAMIN) capsule Take 1 capsule by mouth daily.        . Nutritional Supplements (GLUCOSAMINE COMPLEX PO) Take 2 tablets by mouth daily.       . ondansetron (ZOFRAN ODT) 4 MG disintegrating tablet Take 4 mg by mouth every 12 (twelve) hours as needed.        . promethazine (PHENERGAN) 25 MG tablet Take 25 mg by mouth every 8 (eight) hours as needed.        . valACYclovir (VALTREX) 500 MG tablet Take 1 tablet (500 mg total) by mouth daily as needed. for break out  90 tablet  3   No current facility-administered medications on file prior to visit.    Review of Systems Review of Systems  Constitutional: Negative for fever, appetite change, fatigue and unexpected weight change.  Eyes: Negative for pain and visual disturbance.  Respiratory: Negative for cough  and shortness of breath.   Cardiovascular: Negative for cp or palpitations    Gastrointestinal: Negative for nausea, diarrhea and constipation.  Genitourinary: Negative for urgency and frequency. pos for vaginal itching and scant d/c Skin: Negative for pallor or rash   Neurological: Negative for weakness, light-headedness, numbness and headaches.  Hematological: Negative for adenopathy. Does not bruise/bleed easily.  Psychiatric/Behavioral: Negative for dysphoric mood. The patient is not nervous/anxious.         Objective:   Physical Exam  Constitutional: She appears well-developed and well-nourished. No distress.  Eyes: Conjunctivae and EOM are normal. Pupils are equal, round, and reactive to light.  Cardiovascular: Normal rate and regular rhythm.   Abdominal: Soft. Bowel sounds are normal. She exhibits no distension and no mass. There is no tenderness.  No suprapubic tenderness or fullness    Genitourinary: There is no rash, tenderness or lesion on the right labia. There is no rash, tenderness or lesion on the left labia. There is erythema around the vagina. No bleeding around the vagina. Vaginal discharge found.  Mild hyperemia of vaginal mucosa with scant discharge   Skin: Skin is warm and dry. There is erythema. No pallor.  Psychiatric: She has a normal mood and affect.          Assessment & Plan:

## 2013-02-22 NOTE — Assessment & Plan Note (Signed)
Yeast on wet prep tx with single dose diflucan Urged to eat yogurt for probiotic properties Update if not starting to improve in a week or if worsening

## 2013-05-05 ENCOUNTER — Other Ambulatory Visit: Payer: Self-pay | Admitting: Family Medicine

## 2013-05-05 NOTE — Telephone Encounter (Signed)
Pt is needing refill on Levothyroxine and Novax (generic) 90 days each. Pt will use [facility] and she has never used mail order before I told her she may have to come by and pick those up initially for this first time to mail off. And I did ask her if she had any refills left on the medications and she said no.

## 2013-05-07 MED ORDER — LEVOTHYROXINE SODIUM 125 MCG PO TABS: 125.0000 ug | ORAL_TABLET | Freq: Every day | ORAL | Status: DC

## 2013-05-07 MED ORDER — AMLODIPINE BESYLATE 5 MG PO TABS: 5.0000 mg | ORAL_TABLET | Freq: Every day | ORAL | Status: DC

## 2013-05-07 NOTE — Telephone Encounter (Signed)
Left v/m for pt to cb to get additional info.

## 2013-05-07 NOTE — Telephone Encounter (Signed)
Pt insurance has changed and pt is using [facility] pharmacy now but pt has not set up an account with [facility] yet. Pt request written rx for Norvasc and Levothyroxine. Call pt when ready for pick up.

## 2013-05-07 NOTE — Telephone Encounter (Signed)
Rxs printed and placed in your inbox for signing tomorrow

## 2013-05-07 NOTE — Telephone Encounter (Signed)
Please print them out with a year of refills and I will sign them when I come in in the am

## 2013-05-08 MED ORDER — LEVOTHYROXINE SODIUM 125 MCG PO TABS: 125.0000 ug | ORAL_TABLET | Freq: Every day | ORAL | Status: DC

## 2013-05-08 MED ORDER — AMLODIPINE BESYLATE 5 MG PO TABS: 5.0000 mg | ORAL_TABLET | Freq: Every day | ORAL | Status: DC

## 2013-05-08 NOTE — Addendum Note (Signed)
Addended by: Helene Shoe on: 05/08/2013 11:37 AM   Modules accepted: Orders

## 2013-05-08 NOTE — Telephone Encounter (Signed)
Pt called back and has set up account with [facility] and wants rx faxed to [facility] instead of picking up rx. Rx sent for shredding and rx faxed as requested.

## 2013-05-08 NOTE — Telephone Encounter (Signed)
Pt notified Rxs are ready for pick up 

## 2013-06-16 ENCOUNTER — Encounter: Payer: Self-pay | Admitting: Family Medicine

## 2013-06-16 ENCOUNTER — Ambulatory Visit (INDEPENDENT_AMBULATORY_CARE_PROVIDER_SITE_OTHER): Payer: Medicare HMO | Admitting: Family Medicine

## 2013-06-16 VITALS — BP 126/82 | HR 63 | Temp 98.3°F | Ht 64.25 in | Wt 177.5 lb

## 2013-06-16 DIAGNOSIS — J019 Acute sinusitis, unspecified: Secondary | ICD-10-CM

## 2013-06-16 MED ORDER — AMOXICILLIN-POT CLAVULANATE 875-125 MG PO TABS: 1.0000 | ORAL_TABLET | Freq: Two times a day (BID) | ORAL | Status: DC

## 2013-06-16 NOTE — Progress Notes (Signed)
Pre visit review using our clinic review tool, if applicable. No additional management support is needed unless otherwise documented below in the visit note. 

## 2013-06-16 NOTE — Patient Instructions (Signed)
Continue mucinex (whatever formula works best for your symptoms) Drink lots of fluids Nasal saline spray for congestion  augmentin for the sinus infection   Update if not starting to improve in a week or if worsening

## 2013-06-16 NOTE — Progress Notes (Signed)
Subjective:    Patient ID: Meghan Dixon, female    DOB: [birth year], 68 y.o.   MRN: [id number]  HPI Here with symptoms of sinusitis  Started a week ago Sunday and not getting better  Pretty raw throat and dizziness   Taking mucinex and using flonase and allegra - then changed to alka selzer cold   Got some better and then worse again Ears - pressure  Post nasal drainge  Headache and facial pressure -hurts to wear her glasses  Nasal d/c is yellow - has been dark/ bloody at times  No fever   Patient Active Problem List   Diagnosis Date Noted  . Vaginitis 02/20/2013  . Urinary, incontinence, stress female 01/30/2013  . Urinary frequency 01/30/2013  . Unspecified vitamin D deficiency 09/03/2011  . Gynecological examination 09/03/2011  . Hypothyroid 08/29/2010  . Routine general medical examination at a health care facility 08/22/2010  . BACK PAIN, LUMBAR 08/29/2009  . SCIATICA, LEFT 08/29/2009  . HYPERCHOLESTEROLEMIA 08/22/2009  . BENIGN POSITIONAL VERTIGO 03/23/2009  . ESSENTIAL HYPERTENSION 05/27/2007  . 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 TENSION HEADACHE 10/21/2006  . MIGRAINE HEADACHE 10/21/2006  . SYNDROME, CARPAL TUNNEL 10/21/2006  . ALLERGIC RHINITIS 10/21/2006  . FIBROCYSTIC BREAST DISEASE 10/21/2006  . OSTEOPENIA 10/21/2006   Past Medical History  Diagnosis Date  . Allergy     allergic rhinitis  . Osteopenia   . Hypertension   . HSV infection   . Hyperthyroidism   . Arthritis    Past Surgical History  Procedure Laterality Date  . Abdominal hysterectomy      for bleeding and ovarian cyst  . Appendectomy    . Tonsillectomy    . Carpal tunnel release      left  . Foot surgery      bilateral   History  Substance Use Topics  . Smoking status: Former Smoker    Quit date: 04/16/1984  . Smokeless tobacco: Never Used  . Alcohol Use: No   Family History  Problem Relation Age of Onset  . Heart disease Mother   . Cancer Father     esophageal CA  . Alcohol abuse  Father   . Arthritis Sister     RA  . Heart disease Brother     CAD  . Colon cancer Neg Hx   . Stomach cancer Neg Hx    Allergies  Allergen Reactions  . Beta Adrenergic Blockers     REACTION: reaction not known  . Cefuroxime Axetil Hives    Throat closes  . Cetirizine Hcl     REACTION: dizzy  . Fexofenadine     REACTION: dizzy  . Flonase [Fluticasone Propionate] Other (See Comments)    dizziness  . Prednisone     REACTION: reaction not known   Current Outpatient Prescriptions on File Prior to Visit  Medication Sig Dispense Refill  . acyclovir (ZOVIRAX) 5 % cream Apply 1 application topically every 3 (three) hours. Apply to affected area 5 times daily as needed for breakout.  15 g  3  . amLODipine (NORVASC) 5 MG tablet Take 1 tablet (5 mg total) by mouth daily.  90 tablet  3  . Ascorbic Acid (VITAMIN C) 100 MG tablet Take 200 mg by mouth 2 (two) times a week.       . calcium carbonate (OS-CAL) 600 MG TABS Take 1,200 mg by mouth daily.       Marland Kitchen eletriptan (RELPAX) 40 MG tablet One tablet by mouth at onset  of headache. May repeat in 2 hours if headache persists or recurs.  27 tablet  3  . levothyroxine (SYNTHROID, LEVOTHROID) 125 MCG tablet Take 1 tablet (125 mcg total) by mouth daily.  90 tablet  3  . meclizine (ANTIVERT) 25 MG tablet Take 25-50 mg by mouth 3 (three) times daily as needed.        . Multiple Vitamin (MULTIVITAMIN) capsule Take 1 capsule by mouth daily.        . Nutritional Supplements (GLUCOSAMINE COMPLEX PO) Take 2 tablets by mouth daily.       . ondansetron (ZOFRAN ODT) 4 MG disintegrating tablet Take 4 mg by mouth every 12 (twelve) hours as needed.        . promethazine (PHENERGAN) 25 MG tablet Take 25 mg by mouth every 8 (eight) hours as needed.        . valACYclovir (VALTREX) 500 MG tablet Take 1 tablet (500 mg total) by mouth daily as needed. for break out  90 tablet  3   No current facility-administered medications on file prior to visit.     Review of  Systems    Review of Systems  Constitutional: Negative for fever, appetite change,  and unexpected weight change.  ENT pos for cong and facial pain and ear pain Eyes: Negative for pain and visual disturbance.  Respiratory: Negative for wheeze  and shortness of breath.   Cardiovascular: Negative for cp or palpitations    Gastrointestinal: Negative for nausea, diarrhea and constipation.  Genitourinary: Negative for urgency and frequency.  Skin: Negative for pallor or rash   Neurological: Negative for weakness, light-headedness, numbness and headaches.  Hematological: Negative for adenopathy. Does not bruise/bleed easily.  Psychiatric/Behavioral: Negative for dysphoric mood. The patient is not nervous/anxious.      Objective:   Physical Exam  Constitutional: She appears well-developed and well-nourished. No distress.  HENT:  Head: Normocephalic and atraumatic.  Right Ear: External ear normal.  Left Ear: External ear normal.  Mouth/Throat: Oropharynx is clear and moist. No oropharyngeal exudate.  Nares are injected and congested  bilat maxillary sinus tenderness Clear post nasal drip  Eyes: Conjunctivae and EOM are normal. Pupils are equal, round, and reactive to light. No scleral icterus.  Neck: Normal range of motion. Neck supple.  Cardiovascular: Normal rate and regular rhythm.   Pulmonary/Chest: Effort normal and breath sounds normal. No respiratory distress. She has no wheezes. She has no rales.  Lymphadenopathy:    She has no cervical adenopathy.  Neurological: She is alert.  Skin: No rash noted.  Psychiatric: She has a normal mood and affect.          Assessment & Plan:

## 2013-06-17 NOTE — Assessment & Plan Note (Signed)
Cover with augmentin  Disc symptomatic care - see instructions on AVS  Update if not starting to improve in a week or if worsening   

## 2013-10-15 ENCOUNTER — Ambulatory Visit (INDEPENDENT_AMBULATORY_CARE_PROVIDER_SITE_OTHER): Payer: Medicare HMO | Admitting: Family Medicine

## 2013-10-15 ENCOUNTER — Encounter: Payer: Self-pay | Admitting: Family Medicine

## 2013-10-15 VITALS — BP 122/68 | HR 82 | Temp 98.3°F | Wt 169.5 lb

## 2013-10-15 DIAGNOSIS — J019 Acute sinusitis, unspecified: Secondary | ICD-10-CM

## 2013-10-15 MED ORDER — AZITHROMYCIN 250 MG PO TABS: ORAL_TABLET | ORAL | Status: DC

## 2013-10-15 MED ORDER — ALBUTEROL SULFATE HFA 108 (90 BASE) MCG/ACT IN AERS: 2.0000 | INHALATION_SPRAY | Freq: Four times a day (QID) | RESPIRATORY_TRACT | Status: DC | PRN

## 2013-10-15 NOTE — Progress Notes (Signed)
Pre visit review using our clinic review tool, if applicable. No additional management support is needed unless otherwise documented below in the visit note.  Sx started about 6 days ago.  Stuffy, "like I was coming down with a cold."  HA and mild cough.  Cough worse in the meantime.  Use mucinex and afrin for 3 days.  Started taking allegra; minimally dizzy on meds.  See allergy list.  The cough continues, was gagging from cough last night.  Sputum now gray/green, blood streaked this AM- likely from straining with the cough.  Husband with hydrocodone cough medicine, she used a little but didn't help. Taking tylenol for the HA.  No fevers now, likely had some prev in the week.  Some wheeze prev.    Meds, vitals, and allergies reviewed.   ROS: See HPI.  Otherwise, noncontributory.  GEN: nad, alert and oriented HEENT: mucous membranes moist, tm w/o erythema, nasal exam w/o erythema, clear discharge noted,  OP with cobblestoning NECK: supple w/o LA CV: rrr.   PULM: ctab, no inc wob EXT: no edema SKIN: no acute rash Max sinus ttp B

## 2013-10-15 NOTE — Patient Instructions (Signed)
Take mucinex D with plenty of fluids.  Use the inhaler for the cough and start the antibiotics.  Take care.  Glad to see you.

## 2013-10-18 NOTE — Assessment & Plan Note (Signed)
Use mucinex D, start zmax and SABA with routine precautions and instructions, OTC antihistamines.  F/u prn. Nontoxic.

## 2013-10-21 ENCOUNTER — Other Ambulatory Visit: Payer: Self-pay

## 2013-10-21 DIAGNOSIS — Z1231 Encounter for screening mammogram for malignant neoplasm of breast: Secondary | ICD-10-CM

## 2013-10-26 ENCOUNTER — Ambulatory Visit
Admission: RE | Admit: 2013-10-26 | Discharge: 2013-10-26 | Disposition: A | Discharge status: Home / Self Care | Payer: Medicare HMO | Source: Ambulatory Visit

## 2013-10-26 DIAGNOSIS — Z1231 Encounter for screening mammogram for malignant neoplasm of breast: Secondary | ICD-10-CM

## 2013-10-29 ENCOUNTER — Encounter: Payer: Self-pay | Admitting: *Deleted

## 2014-02-20 ENCOUNTER — Telehealth: Payer: Self-pay | Admitting: Family Medicine

## 2014-02-20 DIAGNOSIS — E7800 Pure hypercholesterolemia, unspecified: Secondary | ICD-10-CM

## 2014-02-20 DIAGNOSIS — I10 Essential (primary) hypertension: Secondary | ICD-10-CM

## 2014-02-20 DIAGNOSIS — E039 Hypothyroidism, unspecified: Secondary | ICD-10-CM

## 2014-02-20 DIAGNOSIS — M8580 Other specified disorders of bone density and structure, unspecified site: Secondary | ICD-10-CM

## 2014-02-20 DIAGNOSIS — E559 Vitamin D deficiency, unspecified: Secondary | ICD-10-CM

## 2014-02-20 NOTE — Telephone Encounter (Signed)
-----   Message from Ellamae Sia sent at 02/19/2014  9:59 AM EST ----- Regarding: Lab orders for Monday, 11.9.15 Patient is scheduled for CPX labs, please order future labs, Thanks , Karna Christmas

## 2014-02-22 ENCOUNTER — Other Ambulatory Visit (INDEPENDENT_AMBULATORY_CARE_PROVIDER_SITE_OTHER): Payer: Commercial Managed Care - HMO

## 2014-02-22 DIAGNOSIS — I10 Essential (primary) hypertension: Secondary | ICD-10-CM

## 2014-02-22 DIAGNOSIS — E7800 Pure hypercholesterolemia, unspecified: Secondary | ICD-10-CM

## 2014-02-22 DIAGNOSIS — E559 Vitamin D deficiency, unspecified: Secondary | ICD-10-CM

## 2014-02-22 DIAGNOSIS — E039 Hypothyroidism, unspecified: Secondary | ICD-10-CM

## 2014-02-22 DIAGNOSIS — M8580 Other specified disorders of bone density and structure, unspecified site: Secondary | ICD-10-CM

## 2014-02-22 DIAGNOSIS — Z0000 Encounter for general adult medical examination without abnormal findings: Secondary | ICD-10-CM

## 2014-02-22 LAB — LIPID PANEL
CHOL/HDL RATIO: 4
CHOLESTEROL: 178 mg/dL (ref 0–200)
HDL: 48.1 mg/dL (ref >39.00)
LDL Cholesterol: 119 mg/dL — ABNORMAL HIGH (ref 0–99)
NonHDL: 129.9
TRIGLYCERIDES: 53 mg/dL (ref 0.0–149.0)
VLDL: 10.6 mg/dL (ref 0.0–40.0)

## 2014-02-22 LAB — COMPREHENSIVE METABOLIC PANEL
ALBUMIN: 3.4 g/dL — AB (ref 3.5–5.2)
ALT: 38 U/L — ABNORMAL HIGH (ref 0–35)
AST: 43 U/L — AB (ref 0–37)
Alkaline Phosphatase: 66 U/L (ref 39–117)
BUN: 12 mg/dL (ref 6–23)
CALCIUM: 9 mg/dL (ref 8.4–10.5)
CO2: 31 meq/L (ref 19–32)
Chloride: 104 mEq/L (ref 96–112)
Creatinine, Ser: 0.6 mg/dL (ref 0.4–1.2)
GFR: 102.3 mL/min (ref >60.00)
Glucose, Bld: 103 mg/dL — ABNORMAL HIGH (ref 70–99)
Potassium: 3.5 mEq/L (ref 3.5–5.1)
SODIUM: 141 meq/L (ref 135–145)
Total Bilirubin: 0.4 mg/dL (ref 0.2–1.2)
Total Protein: 7 g/dL (ref 6.0–8.3)

## 2014-02-22 LAB — CBC WITH DIFFERENTIAL/PLATELET
BASOS ABS: 0 10*3/uL (ref 0.0–0.1)
Basophils Relative: 0.5 % (ref 0.0–3.0)
Eosinophils Absolute: 0.2 10*3/uL (ref 0.0–0.7)
Eosinophils Relative: 3.8 % (ref 0.0–5.0)
HCT: 40.4 % (ref 36.0–46.0)
Hemoglobin: 13.2 g/dL (ref 12.0–15.0)
LYMPHS ABS: 1.3 10*3/uL (ref 0.7–4.0)
Lymphocytes Relative: 28.9 % (ref 12.0–46.0)
MCHC: 32.8 g/dL (ref 30.0–36.0)
MCV: 89.9 fl (ref 78.0–100.0)
MONOS PCT: 15.1 % — AB (ref 3.0–12.0)
Monocytes Absolute: 0.7 10*3/uL (ref 0.1–1.0)
NEUTROS PCT: 51.7 % (ref 43.0–77.0)
Neutro Abs: 2.4 10*3/uL (ref 1.4–7.7)
PLATELETS: 213 10*3/uL (ref 150.0–400.0)
RBC: 4.49 Mil/uL (ref 3.87–5.11)
RDW: 13.7 % (ref 11.5–15.5)
WBC: 4.6 10*3/uL (ref 4.0–10.5)

## 2014-02-22 LAB — VITAMIN D 25 HYDROXY (VIT D DEFICIENCY, FRACTURES): VITD: 28.75 ng/mL — ABNORMAL LOW (ref 30.00–100.00)

## 2014-02-22 LAB — TSH: TSH: 0.45 u[IU]/mL (ref 0.35–4.50)

## 2014-03-01 ENCOUNTER — Ambulatory Visit (INDEPENDENT_AMBULATORY_CARE_PROVIDER_SITE_OTHER): Payer: Commercial Managed Care - HMO | Admitting: Family Medicine

## 2014-03-01 ENCOUNTER — Encounter: Payer: Self-pay | Admitting: Family Medicine

## 2014-03-01 VITALS — BP 124/76 | HR 83 | Temp 98.1°F | Ht 63.5 in | Wt 170.5 lb

## 2014-03-01 DIAGNOSIS — Z23 Encounter for immunization: Secondary | ICD-10-CM

## 2014-03-01 DIAGNOSIS — I10 Essential (primary) hypertension: Secondary | ICD-10-CM

## 2014-03-01 DIAGNOSIS — E7800 Pure hypercholesterolemia, unspecified: Secondary | ICD-10-CM

## 2014-03-01 DIAGNOSIS — Z0000 Encounter for general adult medical examination without abnormal findings: Secondary | ICD-10-CM

## 2014-03-01 DIAGNOSIS — E039 Hypothyroidism, unspecified: Secondary | ICD-10-CM

## 2014-03-01 DIAGNOSIS — M8580 Other specified disorders of bone density and structure, unspecified site: Secondary | ICD-10-CM

## 2014-03-01 DIAGNOSIS — E559 Vitamin D deficiency, unspecified: Secondary | ICD-10-CM

## 2014-03-01 MED ORDER — AMLODIPINE BESYLATE 5 MG PO TABS: 5.0000 mg | ORAL_TABLET | Freq: Every day | ORAL | Status: DC

## 2014-03-01 MED ORDER — LEVOTHYROXINE SODIUM 125 MCG PO TABS: 125.0000 ug | ORAL_TABLET | Freq: Every day | ORAL | Status: DC

## 2014-03-01 NOTE — Assessment & Plan Note (Signed)
bp in fair control at this time  BP Readings from Last 1 Encounters:  03/01/14 124/76   No changes needed Disc lifstyle change with low sodium diet and exercise  Labs reviewed

## 2014-03-01 NOTE — Assessment & Plan Note (Signed)
Reviewed health habits including diet and exercise and skin cancer prevention Reviewed appropriate screening tests for age  Also reviewed health mt list, fam hx and immunization status , as well as social and family history   Flu vaccine and pneumovax 23 today  See HPI  Labs rev in detail

## 2014-03-01 NOTE — Patient Instructions (Signed)
Flu shot today Pneumonia vaccine today  Exercise - aim for 5 days per week -to help fitness and your HDL cholesterol  Avoid red meat/ fried foods/ egg yolks/ fatty breakfast meats/ butter, cheese and high fat dairy/ and shellfish   Add an extra 2000 iu of vitamin D per day for bone health  Take care of yourself

## 2014-03-01 NOTE — Progress Notes (Signed)
Pre visit review using our clinic review tool, if applicable. No additional management support is needed unless otherwise documented below in the visit note. 

## 2014-03-01 NOTE — Progress Notes (Signed)
Subjective:    Patient ID: Meghan Dixon, female    DOB: 01-24-1946, 68 y.o.   MRN: [id number]  HPI Here for annual medicare wellness visit as well as f/u of chronic and acute medical problems  I have personally reviewed the Medicare Annual Wellness questionnaire and have noted 1. The patient's medical and social history 2. Their use of alcohol, tobacco or illicit drugs 3. Their current medications and supplements 4. The patient's functional ability including ADL's, fall risks, home safety risks and hearing or visual             impairment. 5. Diet and physical activities 6. Evidence for depression or mood disorders  The patients weight, height, BMI have been recorded in the chart and visual acuity is per eye clinic.  I have made referrals, counseling and provided education to the patient based review of the above and I have provided the pt with a written personalized care plan for preventive services.  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  Routine anticipatory guidance given to patient.  See health maintenance. Colon cancer screening 1/13 -10 year recall  Breast cancer screening 7/15 -nl  Self breast exam-no lumps or changes  Flu vaccine- will get that today  Tetanus vaccine 5/12  Pneumovax- wants to get today Zoster vaccine 8/12   Advance directive- she has living will /POA set up already  Cognitive function addressed- see scanned forms- and if abnormal then additional documentation follows. -no issues  Does balance her checkbook Mood is good   PMH and SH reviewed  Meds, vitals, and allergies reviewed.   ROS: See HPI.  Otherwise negative.     bp is stable today  No cp or palpitations or headaches or edema  No side effects to medicines  BP Readings from Last 3 Encounters:  03/01/14 124/76  10/15/13 122/68  06/16/13 126/82     Wt is stable with  bmi of 29  Glucose is 103 fasting   ast /alt up  Does not drink alcohol  She does occ take acetaminophen (at the time of the draw she was taking cold med with ? Acetaminophen in it)  Lab Results  Component Value Date   ALT 38* 02/22/2014   AST 43* 02/22/2014   ALKPHOS 66 02/22/2014   BILITOT 0.4 02/22/2014      Hypothyroidism  Pt has no clinical changes No change in energy level/ hair or skin/ edema and no tremor Lab Results  Component Value Date   TSH 0.45 02/22/2014      Hyperlipidemia Lab Results  Component Value Date   CHOL 178 02/22/2014   CHOL 200 08/29/2012   CHOL 181 08/27/2011   Lab Results  Component Value Date   HDL 48.10 02/22/2014   HDL 60.60 08/29/2012   HDL 57.10 08/27/2011   Lab Results  Component Value Date   LDLCALC 119* 02/22/2014   LDLCALC 131* 08/29/2012   LDLCALC 105* 08/27/2011   Lab Results  Component Value Date   TRIG 53.0 02/22/2014   TRIG 44.0 08/29/2012   TRIG 94.0 08/27/2011   Lab Results  Component Value Date   CHOLHDL 4 02/22/2014   CHOLHDL 3 08/29/2012   CHOLHDL 3 08/27/2011   Lab Results  Component Value Date   LDLDIRECT 118.5 06/05/2006   she started taking fish oil this year - ?  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 Encounter for Medicare annual wellness exam 03/01/2014  . Acute sinusitis 06/16/2013  . Vaginitis 02/20/2013  . Urinary, incontinence, stress female 01/30/2013  . Urinary frequency 01/30/2013  . Vitamin D deficiency 09/03/2011  . Gynecological examination 09/03/2011  . Hypothyroid 08/29/2010  . Routine general medical examination at a health care facility 08/22/2010  . BACK PAIN, LUMBAR 08/29/2009  . SCIATICA, LEFT 08/29/2009    . HYPERCHOLESTEROLEMIA 08/22/2009  . BENIGN POSITIONAL VERTIGO 03/23/2009  . Essential hypertension 05/27/2007  . 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 TENSION HEADACHE 10/21/2006  . MIGRAINE HEADACHE 10/21/2006  . SYNDROME, CARPAL TUNNEL 10/21/2006  . ALLERGIC RHINITIS 10/21/2006  . FIBROCYSTIC BREAST DISEASE 10/21/2006  . Osteopenia 10/21/2006   Past Medical History  Diagnosis Date  . Allergy     allergic rhinitis  . Osteopenia   . Hypertension   . HSV infection   . Hyperthyroidism   . Arthritis    Past Surgical History  Procedure Laterality Date  . Abdominal hysterectomy      for bleeding and ovarian cyst  . Appendectomy    . Tonsillectomy    . Carpal tunnel release      left  . Foot surgery      bilateral   History  Substance Use Topics  . Smoking status: Former Smoker    Quit date: 04/16/1984  . Smokeless tobacco: Never Used  . Alcohol Use: No   Family History  Problem Relation Age of Onset  . Heart disease Mother   . Cancer Father     esophageal CA  . Alcohol abuse Father   . Arthritis Sister     RA  . Heart disease Brother     CAD  . Colon cancer Neg Hx   . Stomach cancer Neg Hx    Allergies  Allergen Reactions  . Augmentin [Amoxicillin-Pot Clavulanate] Other (See Comments)    Extreme heartburn  . Beta Adrenergic Blockers     REACTION: reaction not known  . Cefuroxime Axetil Hives    Throat closes  . Cetirizine Hcl     REACTION: dizzy  . Fexofenadine     REACTION: dizzy  . Flonase [Fluticasone Propionate] Other (See Comments)    dizziness  . Prednisone     REACTION: reaction not known   Current Outpatient Prescriptions on File Prior to Visit  Medication Sig Dispense Refill  . amLODipine (NORVASC) 5 MG tablet Take 1 tablet (5 mg total) by mouth daily. 90 tablet 3  . calcium carbonate (OS-CAL) 600 MG TABS Take 1,200 mg by mouth daily.     Marland Kitchen levothyroxine (SYNTHROID, LEVOTHROID) 125 MCG tablet Take 1 tablet (125 mcg total) by mouth daily. 90 tablet 3  .  Multiple Vitamin (MULTIVITAMIN) capsule Take 1 capsule by mouth daily.      . Ascorbic Acid (VITAMIN C) 100 MG tablet Take 200 mg by mouth 2 (two) times a week.      No current facility-administered medications on file prior to visit.     Review of Systems    Review of Systems  Constitutional: Negative for fever, appetite  change, fatigue and unexpected weight change.  Eyes: Negative for pain and visual disturbance.  Respiratory: Negative for cough and shortness of breath.   Cardiovascular: Negative for cp or palpitations    Gastrointestinal: Negative for nausea, diarrhea and constipation.  Genitourinary: Negative for urgency and frequency.  Skin: Negative for pallor or rash   Neurological: Negative for weakness, light-headedness, numbness and headaches.  Hematological: Negative for adenopathy. Does not bruise/bleed easily.  Psychiatric/Behavioral: Negative for dysphoric mood. The patient is not nervous/anxious.      Objective:   Physical Exam  Constitutional: She appears well-developed and well-nourished. No distress.  obese and well appearing   HENT:  Head: Normocephalic and atraumatic.  Right Ear: External ear normal.  Left Ear: External ear normal.  Mouth/Throat: Oropharynx is clear and moist.  Eyes: Conjunctivae and EOM are normal. Pupils are equal, round, and reactive to light. No scleral icterus.  Neck: Normal range of motion. Neck supple. No JVD present. Carotid bruit is not present. No thyromegaly present.  Cardiovascular: Normal rate, regular rhythm, normal heart sounds and intact distal pulses.  Exam reveals no gallop.   Pulmonary/Chest: Effort normal and breath sounds normal. No respiratory distress. She has no wheezes. She exhibits no tenderness.  Abdominal: Soft. Bowel sounds are normal. She exhibits no distension, no abdominal bruit and no mass. There is no tenderness.  Genitourinary: No breast swelling, tenderness, discharge or bleeding.  Breast exam: No mass,  nodules, thickening, tenderness, bulging, retraction, inflamation, nipple discharge or skin changes noted.  No axillary or clavicular LA.      Musculoskeletal: Normal range of motion. She exhibits no edema or tenderness.  Lymphadenopathy:    She has no cervical adenopathy.  Neurological: She is alert. She has normal reflexes. No cranial nerve deficit. She exhibits normal muscle tone. Coordination normal.  Skin: Skin is warm and dry. No rash noted. No erythema. No pallor.  Psychiatric: She has a normal mood and affect.          Assessment & Plan:   Problem List Items Addressed This Visit      Cardiovascular and Mediastinum   Essential hypertension    bp in fair control at this time  BP Readings from Last 1 Encounters:  03/01/14 124/76   No changes needed Disc lifstyle change with low sodium diet and exercise  Labs reviewed      Relevant Medications      amLODIpine (NORVASC) tablet     Endocrine   Hypothyroid    Hypothyroidism  Pt has no clinical changes No change in energy level/ hair or skin/ edema and no tremor Lab Results  Component Value Date   TSH 0.45 02/22/2014         Relevant Medications      levothyroxine (SYNTHROID, LEVOTHROID) tablet     Musculoskeletal and Integument   Osteopenia    Up to date with dexa Disc need for calcium/ vitamin D/ wt bearing exercise and bone density test every 2 y to monitor Disc safety/ fracture risk in detail  Will add D - level is low       Other   Encounter for Medicare annual wellness exam - Primary    Reviewed health habits including diet and exercise and skin cancer prevention Reviewed appropriate screening tests for age  Also reviewed health mt list, fam hx and immunization status , as well as social and family history   Flu vaccine and pneumovax 23 today  See HPI  Labs rev in detail  HYPERCHOLESTEROLEMIA    Disc goals for lipids and reasons to control them Rev labs with pt Rev low sat fat diet in  detail Disc inc exercise to raise HDL      Relevant Medications      amLODIpine (NORVASC) tablet   Vitamin D deficiency    Level is not at goal Disc imp to bone and overall health  Add 2000 iu daily otc D3 Continue to follow     Other Visit Diagnoses    Need for 23-polyvalent pneumococcal polysaccharide vaccine        Relevant Orders       Pneumococcal polysaccharide vaccine 23-valent greater than or equal to 2yo subcutaneous/IM (Completed)    Need for prophylactic vaccination and inoculation against influenza        Relevant Orders       Flu Vaccine QUAD 36+ mos PF IM (Fluarix Quad PF) (Completed)

## 2014-03-01 NOTE — Assessment & Plan Note (Signed)
Level is not at goal Disc imp to bone and overall health  Add 2000 iu daily otc D3 Continue to follow

## 2014-03-01 NOTE — Assessment & Plan Note (Signed)
Disc goals for lipids and reasons to control them Rev labs with pt Rev low sat fat diet in detail Disc inc exercise to raise HDL

## 2014-03-01 NOTE — Assessment & Plan Note (Signed)
Hypothyroidism  Pt has no clinical changes No change in energy level/ hair or skin/ edema and no tremor Lab Results  Component Value Date   TSH 0.45 02/22/2014

## 2014-03-01 NOTE — Assessment & Plan Note (Signed)
Up to date with dexa Disc need for calcium/ vitamin D/ wt bearing exercise and bone density test every 2 y to monitor Disc safety/ fracture risk in detail  Will add D - level is low

## 2014-03-02 ENCOUNTER — Telehealth: Payer: Self-pay | Admitting: Family Medicine

## 2014-03-02 NOTE — Telephone Encounter (Signed)
emmi mailed  °

## 2014-06-07 ENCOUNTER — Encounter: Payer: Self-pay | Admitting: Family Medicine

## 2014-06-07 ENCOUNTER — Ambulatory Visit (INDEPENDENT_AMBULATORY_CARE_PROVIDER_SITE_OTHER): Payer: Commercial Managed Care - HMO | Admitting: Family Medicine

## 2014-06-07 VITALS — BP 122/68 | HR 69 | Temp 98.2°F | Ht 64.0 in | Wt 173.8 lb

## 2014-06-07 DIAGNOSIS — J0100 Acute maxillary sinusitis, unspecified: Secondary | ICD-10-CM | POA: Diagnosis not present

## 2014-06-07 DIAGNOSIS — J019 Acute sinusitis, unspecified: Secondary | ICD-10-CM | POA: Insufficient documentation

## 2014-06-07 MED ORDER — BENZONATATE 200 MG PO CAPS: 200.0000 mg | ORAL_CAPSULE | Freq: Two times a day (BID) | ORAL | Status: DC | PRN

## 2014-06-07 MED ORDER — CLARITHROMYCIN ER 500 MG PO TB24: 1000.0000 mg | ORAL_TABLET | Freq: Every day | ORAL | Status: DC

## 2014-06-07 NOTE — Assessment & Plan Note (Signed)
With viral uri and cough Cover with biaxin (pt pref) Fluids/rest   Disc symptomatic care - see instructions on AVS  Px for tessalon for cough - disc how to use  Update if not starting to improve in a week or if worsening

## 2014-06-07 NOTE — Progress Notes (Signed)
Subjective:    Patient ID: Meghan Dixon, female    DOB: August 14, 1945, 69 y.o.   MRN: [id number]  HPI Here with uri symptoms  Started last Tuesday   Coughing - barky , prod of brown phlegm  Head congestion is worsening now - lot of sinus pressure and pain  Her ears are blocked up  L eye is pink and yucky- some d/c (using hot compresses)- other eye itches   No fever   Had a flu shot   otc : taking mucinex, nyquil -not a lot of help  Used her husb hydrocodine- made her stomach upset   Patient Active Problem List   Diagnosis Date Noted  . Encounter for Medicare annual wellness exam 03/01/2014  . Vaginitis 02/20/2013  . Urinary, incontinence, stress female 01/30/2013  . Urinary frequency 01/30/2013  . Vitamin D deficiency 09/03/2011  . Gynecological examination 09/03/2011  . Hypothyroid 08/29/2010  . Routine general medical examination at a health care facility 08/22/2010  . BACK PAIN, LUMBAR 08/29/2009  . SCIATICA, LEFT 08/29/2009  . HYPERCHOLESTEROLEMIA 08/22/2009  . BENIGN POSITIONAL VERTIGO 03/23/2009  . Essential hypertension 05/27/2007  . 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 TENSION HEADACHE 10/21/2006  . MIGRAINE HEADACHE 10/21/2006  . SYNDROME, CARPAL TUNNEL 10/21/2006  . ALLERGIC RHINITIS 10/21/2006  . FIBROCYSTIC BREAST DISEASE 10/21/2006  . Osteopenia 10/21/2006   Past Medical History  Diagnosis Date  . Allergy     allergic rhinitis  . Osteopenia   . Hypertension   . HSV infection   . Hyperthyroidism   . Arthritis    Past Surgical History  Procedure Laterality Date  . Abdominal hysterectomy      for bleeding and ovarian cyst  . Appendectomy    . Tonsillectomy    . Carpal tunnel release      left  . Foot surgery      bilateral   History  Substance Use Topics  . Smoking status: Former Smoker    Quit date: 04/16/1984  . Smokeless tobacco: Never Used  . Alcohol Use: No   Family History  Problem Relation Age of Onset  . Heart disease Mother   . Cancer Father      esophageal CA  . Alcohol abuse Father   . Arthritis Sister     RA  . Heart disease Brother     CAD  . Colon cancer Neg Hx   . Stomach cancer Neg Hx    Allergies  Allergen Reactions  . Augmentin [Amoxicillin-Pot Clavulanate] Other (See Comments)    Extreme heartburn  . Beta Adrenergic Blockers     REACTION: reaction not known  . Cefuroxime Axetil Hives    Throat closes  . Cetirizine Hcl     REACTION: dizzy  . Fexofenadine     REACTION: dizzy  . Flonase [Fluticasone Propionate] Other (See Comments)    dizziness  . Prednisone     REACTION: reaction not known   Current Outpatient Prescriptions on File Prior to Visit  Medication Sig Dispense Refill  . amLODipine (NORVASC) 5 MG tablet Take 1 tablet (5 mg total) by mouth daily. 90 tablet 3  . Ascorbic Acid (VITAMIN C) 100 MG tablet Take 200 mg by mouth 2 (two) times a week.     . calcium carbonate (OS-CAL) 600 MG TABS Take 1,200 mg by mouth daily.     . Glucosamine-Chondroit-Vit C-Mn (GLUCOSAMINE 1500 COMPLEX PO) Take 1 tablet by mouth 2 (two) times daily.    Marland Kitchen levothyroxine (SYNTHROID, LEVOTHROID)  125 MCG tablet Take 1 tablet (125 mcg total) by mouth daily. 90 tablet 3  . Multiple Vitamin (MULTIVITAMIN) capsule Take 1 capsule by mouth daily.      . Omega-3 Fatty Acids (FISH OIL) 1200 MG CAPS Take 1 capsule by mouth daily.     No current facility-administered medications on file prior to visit.    Review of Systems Review of Systems  Constitutional: Negative for fever, appetite change,  and unexpected weight change. pos for malaise ENT pos for cong and rhinorrhea and sinus pressure and pain  Eyes: Negative for pain and visual disturbance.  Respiratory: Negative for wheeze  and shortness of breath.   Cardiovascular: Negative for cp or palpitations    Gastrointestinal: Negative for nausea, diarrhea and constipation.  Genitourinary: Negative for urgency and frequency.  Skin: Negative for pallor or rash   Neurological:  Negative for weakness, light-headedness, numbness and headaches.  Hematological: Negative for adenopathy. Does not bruise/bleed easily.  Psychiatric/Behavioral: Negative for dysphoric mood. The patient is not nervous/anxious.         Objective:   Physical Exam  Constitutional: She appears well-developed and well-nourished. No distress.  obese and well appearing   HENT:  Head: Normocephalic and atraumatic.  Right Ear: External ear normal.  Left Ear: External ear normal.  Mouth/Throat: Oropharynx is clear and moist. No oropharyngeal exudate.  Nares are injected and congested  Bilateral maxillary sinus tenderness  Throat clear with post nasal drip   Eyes: Conjunctivae and EOM are normal. Pupils are equal, round, and reactive to light. Right eye exhibits no discharge. Left eye exhibits no discharge.  Neck: Normal range of motion. Neck supple.  Cardiovascular: Normal rate and regular rhythm.   Pulmonary/Chest: Effort normal and breath sounds normal. No respiratory distress. She has no wheezes. She has no rales.  Harsh bs Good air exch Barky cough  Lymphadenopathy:    She has no cervical adenopathy.  Neurological: She is alert.  Skin: Skin is warm and dry. No rash noted.  Psychiatric: She has a normal mood and affect.          Assessment & Plan:   Problem List Items Addressed This Visit      Respiratory   Acute sinusitis - Primary    With viral uri and cough Cover with biaxin (pt pref) Fluids/rest   Disc symptomatic care - see instructions on AVS  Px for tessalon for cough - disc how to use  Update if not starting to improve in a week or if worsening        Relevant Medications   clarithromycin (BIAXIN XL) 500 MG 24 hr tablet   benzonatate (TESSALON) capsule

## 2014-06-07 NOTE — Progress Notes (Signed)
Pre visit review using our clinic review tool, if applicable. No additional management support is needed unless otherwise documented below in the visit note. 

## 2014-06-07 NOTE — Patient Instructions (Signed)
Drink lots of fluids and rest  Use nasal saline for congestion Try mucinex DM maximum strength  Also tessalon for cough  biaxin for sinus infection as directed   Update if not starting to improve in a week or if worsening

## 2014-06-17 ENCOUNTER — Other Ambulatory Visit: Payer: Self-pay

## 2014-06-17 MED ORDER — CLARITHROMYCIN ER 500 MG PO TB24: 1000.0000 mg | ORAL_TABLET | Freq: Every day | ORAL | Status: DC

## 2014-06-17 NOTE — Telephone Encounter (Signed)
Please refill times one  If it does not resolve please follow up

## 2014-06-17 NOTE — Telephone Encounter (Signed)
Pt left /vm; pt was seen with sinus infection on 06/07/14. Pt finished biaxin on 06/13/14; now pt feels like sinus infection is starting all over again and request refill biaxin to [place] [address].Please advise.

## 2014-06-17 NOTE — Telephone Encounter (Signed)
rx sent to [facility] on [address]. Called patient and notified her as well.

## 2014-08-16 ENCOUNTER — Telehealth: Payer: Self-pay | Admitting: *Deleted

## 2014-08-16 NOTE — Telephone Encounter (Signed)
Dx: E78.0 should be the right one for hyperlipidemia

## 2014-08-16 NOTE — Telephone Encounter (Signed)
Patient left a voicemail that she wants to have lab work done to check her cholesterol. Patient has checked with her insurance company to find out if they will pay and she needs the diagnosis code so that she can call her insurance company back and she what they will pay for this test.

## 2014-08-16 NOTE — Telephone Encounter (Signed)
Left voicemail with dx code E78.0

## 2014-08-16 NOTE — Telephone Encounter (Signed)
Pt called back and said she spoke with her insurance and they told her we have to call [phone] and request the lipid panel to be authorized by [place], called # and couldn't get through to a rep, I will try back tomorrow  Pt request call back at [phone]

## 2014-08-24 DIAGNOSIS — M792 Neuralgia and neuritis, unspecified: Secondary | ICD-10-CM | POA: Diagnosis not present

## 2014-09-01 DIAGNOSIS — M542 Cervicalgia: Secondary | ICD-10-CM | POA: Diagnosis not present

## 2014-09-09 DIAGNOSIS — M542 Cervicalgia: Secondary | ICD-10-CM | POA: Diagnosis not present

## 2014-10-01 DIAGNOSIS — H538 Other visual disturbances: Secondary | ICD-10-CM | POA: Diagnosis not present

## 2014-10-01 DIAGNOSIS — H269 Unspecified cataract: Secondary | ICD-10-CM | POA: Diagnosis not present

## 2014-10-25 ENCOUNTER — Other Ambulatory Visit: Payer: Self-pay

## 2014-10-25 DIAGNOSIS — Z1231 Encounter for screening mammogram for malignant neoplasm of breast: Secondary | ICD-10-CM

## 2014-11-02 ENCOUNTER — Ambulatory Visit: Payer: Commercial Managed Care - HMO

## 2014-12-01 DIAGNOSIS — G5601 Carpal tunnel syndrome, right upper limb: Secondary | ICD-10-CM | POA: Diagnosis not present

## 2014-12-01 DIAGNOSIS — M792 Neuralgia and neuritis, unspecified: Secondary | ICD-10-CM | POA: Diagnosis not present

## 2014-12-07 ENCOUNTER — Ambulatory Visit
Admission: RE | Admit: 2014-12-07 | Discharge: 2014-12-07 | Disposition: A | Discharge status: Home / Self Care | Payer: Commercial Managed Care - HMO | Source: Ambulatory Visit

## 2014-12-07 DIAGNOSIS — Z1231 Encounter for screening mammogram for malignant neoplasm of breast: Secondary | ICD-10-CM | POA: Diagnosis not present

## 2014-12-09 ENCOUNTER — Encounter: Payer: Self-pay | Admitting: *Deleted

## 2014-12-09 LAB — HM MAMMOGRAPHY: HM Mammogram: NORMAL

## 2014-12-11 DIAGNOSIS — G5601 Carpal tunnel syndrome, right upper limb: Secondary | ICD-10-CM | POA: Diagnosis not present

## 2015-01-05 DIAGNOSIS — M65321 Trigger finger, right index finger: Secondary | ICD-10-CM | POA: Diagnosis not present

## 2015-01-05 DIAGNOSIS — G5601 Carpal tunnel syndrome, right upper limb: Secondary | ICD-10-CM | POA: Diagnosis not present

## 2015-01-05 DIAGNOSIS — M65841 Other synovitis and tenosynovitis, right hand: Secondary | ICD-10-CM | POA: Diagnosis not present

## 2015-01-19 DIAGNOSIS — Z4789 Encounter for other orthopedic aftercare: Secondary | ICD-10-CM | POA: Diagnosis not present

## 2015-02-23 ENCOUNTER — Telehealth: Payer: Self-pay | Admitting: Family Medicine

## 2015-02-23 NOTE — Telephone Encounter (Signed)
Lab appt schedule and pt wanted to wait until we receive her labs back before we refill med to make sure she is on correct dose

## 2015-02-23 NOTE — Telephone Encounter (Signed)
Please schedule tsh Refill whatever she needs to get by until next appt  Thanks

## 2015-02-23 NOTE — Telephone Encounter (Signed)
Patient called and thought she had scheduled a wellness exam in November, but she hadn't scheduled the appointment.  She scheduled the appointment on 11/11/15.  Patient needs to get her thyroid medication refilled.  She wants to know if Dr.Tower wants her to come in for lab work in order to get it refilled.

## 2015-02-24 ENCOUNTER — Telehealth: Payer: Self-pay | Admitting: Family Medicine

## 2015-02-24 ENCOUNTER — Other Ambulatory Visit (INDEPENDENT_AMBULATORY_CARE_PROVIDER_SITE_OTHER): Payer: Commercial Managed Care - HMO

## 2015-02-24 DIAGNOSIS — E038 Other specified hypothyroidism: Secondary | ICD-10-CM | POA: Diagnosis not present

## 2015-02-24 LAB — TSH: TSH: 0.17 u[IU]/mL — AB (ref 0.35–4.50)

## 2015-02-24 MED ORDER — LEVOTHYROXINE SODIUM 112 MCG PO TABS: 112.0000 ug | ORAL_TABLET | Freq: Every day | ORAL | Status: DC

## 2015-02-24 NOTE — Telephone Encounter (Signed)
tsh is low which means we need to decrease her dose of levothyroxine She is on 125 mcg Will dec to 112 mcg Please call it in -? Which pharmacy she desires

## 2015-02-25 NOTE — Telephone Encounter (Signed)
Called pt and no answer and no voicemail  

## 2015-03-01 MED ORDER — LEVOTHYROXINE SODIUM 112 MCG PO TABS: 112.0000 ug | ORAL_TABLET | Freq: Every day | ORAL | Status: DC

## 2015-03-01 NOTE — Telephone Encounter (Signed)
Pt notified of labs and Dr. Marliss Coots comments, Rx sent to pharmacy and lab appt scheduled to recheck TSH in 6 weeks

## 2015-03-01 NOTE — Telephone Encounter (Signed)
Pt called back saying she saw a missed call from someone here. The best number is reach her is [phone].

## 2015-03-15 DIAGNOSIS — L814 Other melanin hyperpigmentation: Secondary | ICD-10-CM | POA: Diagnosis not present

## 2015-03-15 DIAGNOSIS — L820 Inflamed seborrheic keratosis: Secondary | ICD-10-CM | POA: Diagnosis not present

## 2015-03-15 DIAGNOSIS — L821 Other seborrheic keratosis: Secondary | ICD-10-CM | POA: Diagnosis not present

## 2015-03-31 ENCOUNTER — Other Ambulatory Visit: Payer: Self-pay | Admitting: Family Medicine

## 2015-03-31 DIAGNOSIS — E038 Other specified hypothyroidism: Secondary | ICD-10-CM

## 2015-04-01 ENCOUNTER — Ambulatory Visit (INDEPENDENT_AMBULATORY_CARE_PROVIDER_SITE_OTHER): Payer: Commercial Managed Care - HMO

## 2015-04-01 DIAGNOSIS — Z23 Encounter for immunization: Secondary | ICD-10-CM

## 2015-04-14 ENCOUNTER — Other Ambulatory Visit (INDEPENDENT_AMBULATORY_CARE_PROVIDER_SITE_OTHER): Payer: Commercial Managed Care - HMO

## 2015-04-14 ENCOUNTER — Telehealth: Payer: Self-pay | Admitting: Family Medicine

## 2015-04-14 DIAGNOSIS — E038 Other specified hypothyroidism: Secondary | ICD-10-CM | POA: Diagnosis not present

## 2015-04-14 LAB — TSH: TSH: 1.14 u[IU]/mL (ref 0.35–4.50)

## 2015-04-14 NOTE — Telephone Encounter (Signed)
Pt is requesting that her TSH RX be sent in just for one month after these lab results from today 04/14/15. She usually gets a 3 month supply at one time, but she will be making changes the first of the year.   CB # [phone]

## 2015-04-15 MED ORDER — LEVOTHYROXINE SODIUM 112 MCG PO TABS: 112.0000 ug | ORAL_TABLET | Freq: Every day | ORAL | Status: DC

## 2015-04-15 NOTE — Telephone Encounter (Signed)
Rx was sent and left voicemail letting pt know Rx sent to pharmacy (80month supply)

## 2015-04-29 ENCOUNTER — Ambulatory Visit (INDEPENDENT_AMBULATORY_CARE_PROVIDER_SITE_OTHER): Payer: Commercial Managed Care - HMO | Admitting: Family Medicine

## 2015-04-29 ENCOUNTER — Encounter: Payer: Self-pay | Admitting: Family Medicine

## 2015-04-29 VITALS — BP 150/82 | HR 75 | Temp 98.3°F | Ht 64.0 in | Wt 172.0 lb

## 2015-04-29 DIAGNOSIS — I10 Essential (primary) hypertension: Secondary | ICD-10-CM

## 2015-04-29 DIAGNOSIS — E7800 Pure hypercholesterolemia, unspecified: Secondary | ICD-10-CM

## 2015-04-29 DIAGNOSIS — G43909 Migraine, unspecified, not intractable, without status migrainosus: Secondary | ICD-10-CM

## 2015-04-29 DIAGNOSIS — M8580 Other specified disorders of bone density and structure, unspecified site: Secondary | ICD-10-CM | POA: Diagnosis not present

## 2015-04-29 NOTE — Progress Notes (Signed)
   Subjective:    Patient ID: Meghan Dixon, female    DOB: [birth year], 70 y.o.   MRN: [id number]  HPI 70 yr old female to establish with [place] after transferring from Dr. Glori Bickers. I have been seeing her husband for years as well. She has been feeling well, and her BP has been stable at home, she usually gets readings around 120s over 80s. She is a few months past due for a cpx. She gets yearly mammograms. Her last colonscopy was in 2013. She has ostopenia while her last DEXA was 3 years ago. She has occasional migraines but these are mild and she usually gets relief from taking aspirin or Goody Powders.    Review of Systems  Constitutional: Negative.   Respiratory: Negative.   Cardiovascular: Negative.   Gastrointestinal: Negative.   Genitourinary: Negative.   Neurological: Negative.        Objective:   Physical Exam  Constitutional: She is oriented to person, place, and time. She appears well-developed and well-nourished.  Neck: No thyromegaly present.  Cardiovascular: Normal rate, regular rhythm, normal heart sounds and intact distal pulses.   Pulmonary/Chest: Effort normal and breath sounds normal.  Abdominal: Soft. Bowel sounds are normal. She exhibits no distension and no mass. There is no tenderness. There is no rebound and no guarding.  Lymphadenopathy:    She has no cervical adenopathy.  Neurological: She is alert and oriented to person, place, and time.          Assessment & Plan:  Introductory visit to [place]. Her HTN is stable. She will set up for a cpx and fasting labs in the next few weeks to follow up lipids, etc.  We will schedule another DEXA for her soon to follow the osteopenia.  Her migraines are well controlled.

## 2015-04-29 NOTE — Progress Notes (Signed)
Pre visit review using our clinic review tool, if applicable. No additional management support is needed unless otherwise documented below in the visit note. 

## 2015-05-02 ENCOUNTER — Ambulatory Visit (INDEPENDENT_AMBULATORY_CARE_PROVIDER_SITE_OTHER)
Admission: RE | Admit: 2015-05-02 | Discharge: 2015-05-02 | Disposition: A | Discharge status: Home / Self Care | Payer: Commercial Managed Care - HMO | Source: Ambulatory Visit | Attending: Family Medicine | Admitting: Family Medicine

## 2015-05-02 DIAGNOSIS — M8580 Other specified disorders of bone density and structure, unspecified site: Secondary | ICD-10-CM | POA: Diagnosis not present

## 2015-05-18 ENCOUNTER — Other Ambulatory Visit: Payer: Self-pay | Admitting: Family Medicine

## 2015-05-25 ENCOUNTER — Other Ambulatory Visit (INDEPENDENT_AMBULATORY_CARE_PROVIDER_SITE_OTHER): Payer: Commercial Managed Care - HMO

## 2015-05-25 ENCOUNTER — Telehealth: Payer: Self-pay | Admitting: Family Medicine

## 2015-05-25 DIAGNOSIS — Z0000 Encounter for general adult medical examination without abnormal findings: Secondary | ICD-10-CM

## 2015-05-25 LAB — POC URINALSYSI DIPSTICK (AUTOMATED)
BILIRUBIN UA: NEGATIVE
GLUCOSE UA: NEGATIVE
Ketones, UA: NEGATIVE
Leukocytes, UA: NEGATIVE
NITRITE UA: NEGATIVE
Protein, UA: NEGATIVE
Spec Grav, UA: 1.01
UROBILINOGEN UA: 0.2
pH, UA: 7

## 2015-05-25 LAB — LIPID PANEL
CHOL/HDL RATIO: 3
CHOLESTEROL: 190 mg/dL (ref 0–200)
HDL: 57.6 mg/dL (ref >39.00)
LDL CALC: 121 mg/dL — AB (ref 0–99)
NonHDL: 132.16
TRIGLYCERIDES: 58 mg/dL (ref 0.0–149.0)
VLDL: 11.6 mg/dL (ref 0.0–40.0)

## 2015-05-25 LAB — CBC WITH DIFFERENTIAL/PLATELET
BASOS ABS: 0 10*3/uL (ref 0.0–0.1)
Basophils Relative: 0.3 % (ref 0.0–3.0)
EOS ABS: 0.2 10*3/uL (ref 0.0–0.7)
Eosinophils Relative: 3 % (ref 0.0–5.0)
HEMATOCRIT: 39.8 % (ref 36.0–46.0)
Hemoglobin: 13.1 g/dL (ref 12.0–15.0)
LYMPHS PCT: 29.9 % (ref 12.0–46.0)
Lymphs Abs: 1.9 10*3/uL (ref 0.7–4.0)
MCHC: 33 g/dL (ref 30.0–36.0)
MCV: 91 fl (ref 78.0–100.0)
MONOS PCT: 10.6 % (ref 3.0–12.0)
Monocytes Absolute: 0.7 10*3/uL (ref 0.1–1.0)
NEUTROS ABS: 3.7 10*3/uL (ref 1.4–7.7)
NEUTROS PCT: 56.2 % (ref 43.0–77.0)
PLATELETS: 268 10*3/uL (ref 150.0–400.0)
RBC: 4.37 Mil/uL (ref 3.87–5.11)
RDW: 13.6 % (ref 11.5–15.5)
WBC: 6.5 10*3/uL (ref 4.0–10.5)

## 2015-05-25 LAB — BASIC METABOLIC PANEL
BUN: 16 mg/dL (ref 6–23)
CALCIUM: 9.5 mg/dL (ref 8.4–10.5)
CO2: 28 mEq/L (ref 19–32)
CREATININE: 0.7 mg/dL (ref 0.40–1.20)
Chloride: 105 mEq/L (ref 96–112)
GFR: 88.59 mL/min (ref >60.00)
Glucose, Bld: 101 mg/dL — ABNORMAL HIGH (ref 70–99)
Potassium: 3.8 mEq/L (ref 3.5–5.1)
SODIUM: 141 meq/L (ref 135–145)

## 2015-05-25 LAB — HEPATIC FUNCTION PANEL
ALBUMIN: 4.2 g/dL (ref 3.5–5.2)
ALK PHOS: 60 U/L (ref 39–117)
ALT: 17 U/L (ref 0–35)
AST: 22 U/L (ref 0–37)
BILIRUBIN DIRECT: 0.1 mg/dL (ref 0.0–0.3)
TOTAL PROTEIN: 7.4 g/dL (ref 6.0–8.3)
Total Bilirubin: 0.5 mg/dL (ref 0.2–1.2)

## 2015-05-25 LAB — TSH: TSH: 1.15 u[IU]/mL (ref 0.35–4.50)

## 2015-05-25 MED ORDER — LEVOTHYROXINE SODIUM 112 MCG PO TABS: 112.0000 ug | ORAL_TABLET | Freq: Every day | ORAL | Status: DC

## 2015-05-25 NOTE — Telephone Encounter (Signed)
Pt needs a refill sent to [facility] month only for her Thyroid medication.    [facility] [address] [facility] [place]

## 2015-05-25 NOTE — Telephone Encounter (Signed)
I sent script e-scribe. 

## 2015-06-01 ENCOUNTER — Ambulatory Visit (INDEPENDENT_AMBULATORY_CARE_PROVIDER_SITE_OTHER): Payer: Commercial Managed Care - HMO | Admitting: Family Medicine

## 2015-06-01 ENCOUNTER — Encounter: Payer: Self-pay | Admitting: Family Medicine

## 2015-06-01 VITALS — BP 141/84 | HR 70 | Temp 98.5°F | Ht 64.0 in | Wt 170.0 lb

## 2015-06-01 DIAGNOSIS — Z23 Encounter for immunization: Secondary | ICD-10-CM

## 2015-06-01 DIAGNOSIS — Z0000 Encounter for general adult medical examination without abnormal findings: Secondary | ICD-10-CM | POA: Diagnosis not present

## 2015-06-01 MED ORDER — LEVOTHYROXINE SODIUM 112 MCG PO TABS: 112.0000 ug | ORAL_TABLET | Freq: Every day | ORAL | Status: DC

## 2015-06-01 MED ORDER — AMLODIPINE BESYLATE 5 MG PO TABS: 5.0000 mg | ORAL_TABLET | Freq: Every day | ORAL | Status: DC

## 2015-06-01 NOTE — Progress Notes (Signed)
Pre visit review using our clinic review tool, if applicable. No additional management support is needed unless otherwise documented below in the visit note. 

## 2015-06-01 NOTE — Progress Notes (Signed)
   Subjective:    Patient ID: Meghan Dixon, female    DOB: Jul 16, 1945, 70 y.o.   MRN: [id number]  HPI 70 yr old female for a cpx. She feels well. She does mention some mild ankle swelling that appears in the evenings on days that she is on her feet a lot, but this goes down at night. This is not painful. No SOB. Her BP at home is stable in the 130s over 70s.    Review of Systems  Constitutional: Negative.   HENT: Negative.   Eyes: Negative.   Respiratory: Negative.   Cardiovascular: Negative.   Gastrointestinal: Negative.   Genitourinary: Negative for dysuria, urgency, frequency, hematuria, flank pain, decreased urine volume, enuresis, difficulty urinating, pelvic pain and dyspareunia.  Musculoskeletal: Negative.   Skin: Negative.   Neurological: Negative.   Psychiatric/Behavioral: Negative.        Objective:   Physical Exam  Constitutional: She is oriented to person, place, and time. She appears well-developed and well-nourished. No distress.  HENT:  Head: Normocephalic and atraumatic.  Right Ear: External ear normal.  Left Ear: External ear normal.  Nose: Nose normal.  Mouth/Throat: Oropharynx is clear and moist. No oropharyngeal exudate.  Eyes: Conjunctivae and EOM are normal. Pupils are equal, round, and reactive to light. No scleral icterus.  Neck: Normal range of motion. Neck supple. No JVD present. No thyromegaly present.  Cardiovascular: Normal rate, regular rhythm, normal heart sounds and intact distal pulses.  Exam reveals no gallop and no friction rub.   No murmur heard. EKG normal   Pulmonary/Chest: Effort normal and breath sounds normal. No respiratory distress. She has no wheezes. She has no rales. She exhibits no tenderness.  Abdominal: Soft. Bowel sounds are normal. She exhibits no distension and no mass. There is no tenderness. There is no rebound and no guarding.  Musculoskeletal: Normal range of motion. She exhibits no edema or tenderness.  Lymphadenopathy:     She has no cervical adenopathy.  Neurological: She is alert and oriented to person, place, and time. She has normal reflexes. No cranial nerve deficit. She exhibits normal muscle tone. Coordination normal.  Skin: Skin is warm and dry. No rash noted. No erythema.  Psychiatric: She has a normal mood and affect. Her behavior is normal. Judgment and thought content normal.          Assessment & Plan:  Well exam. We discussed diet and exercise. We agreed to watch the ankle edema and she will follow up if it gets any worse.

## 2015-10-19 ENCOUNTER — Telehealth: Payer: Self-pay | Admitting: Family Medicine

## 2015-10-19 NOTE — Telephone Encounter (Signed)
Pt has been scheduled.  °

## 2015-10-19 NOTE — Telephone Encounter (Signed)
Pt must be seen.

## 2015-10-19 NOTE — Telephone Encounter (Signed)
Can we do a referral to her dermatologist ([place]) for a spot in the middle of her forehead without seeing the pt?

## 2015-10-20 ENCOUNTER — Ambulatory Visit (INDEPENDENT_AMBULATORY_CARE_PROVIDER_SITE_OTHER): Payer: Commercial Managed Care - HMO | Admitting: Family Medicine

## 2015-10-20 ENCOUNTER — Encounter: Payer: Self-pay | Admitting: Family Medicine

## 2015-10-20 VITALS — BP 138/78 | HR 68 | Temp 98.6°F | Ht 64.0 in | Wt 170.0 lb

## 2015-10-20 DIAGNOSIS — L989 Disorder of the skin and subcutaneous tissue, unspecified: Secondary | ICD-10-CM

## 2015-10-20 NOTE — Progress Notes (Signed)
Pre visit review using our clinic review tool, if applicable. No additional management support is needed unless otherwise documented below in the visit note. 

## 2015-10-20 NOTE — Progress Notes (Signed)
   Subjective:    Patient ID: Meghan Dixon, female    DOB: 1945-04-20, 70 y.o.   MRN: [id number]  HPI Here to check some spots on her forehead that appeared about 9 months ago. They grew to their current size and then stopped growing. They get a little darker when she gets a lot of sun. They itch at times.   Review of Systems  Constitutional: Negative.   Skin: Positive for color change.       Objective:   Physical Exam  Constitutional: She appears well-developed and well-nourished.  Skin:  There is one larger and two smaller lesions on the forehead. These are slightly raised and well demarcated. They have a uniform light tan color. They have a velvety surface on touch.           Assessment & Plan:  These are seborrheic keratoses. These are benign but can become irritating. I advised her to stay out of the sun and to wear wide brimmed hats when she is outdoors. Use 1% hydrocortisone cream OTC for itching.  [name], MD

## 2015-11-11 ENCOUNTER — Encounter: Payer: Commercial Managed Care - HMO | Admitting: Family Medicine

## 2016-01-03 ENCOUNTER — Other Ambulatory Visit: Payer: Self-pay | Admitting: Family Medicine

## 2016-01-03 DIAGNOSIS — Z1231 Encounter for screening mammogram for malignant neoplasm of breast: Secondary | ICD-10-CM

## 2016-01-10 ENCOUNTER — Ambulatory Visit (INDEPENDENT_AMBULATORY_CARE_PROVIDER_SITE_OTHER): Payer: Commercial Managed Care - HMO | Admitting: Family Medicine

## 2016-01-10 ENCOUNTER — Encounter: Payer: Self-pay | Admitting: Family Medicine

## 2016-01-10 VITALS — BP 138/80 | HR 72 | Temp 98.6°F | Ht 64.0 in | Wt 172.0 lb

## 2016-01-10 DIAGNOSIS — Z23 Encounter for immunization: Secondary | ICD-10-CM | POA: Diagnosis not present

## 2016-01-10 DIAGNOSIS — M79605 Pain in left leg: Secondary | ICD-10-CM | POA: Diagnosis not present

## 2016-01-10 MED ORDER — ASPIRIN EC 81 MG PO TBEC: 81.0000 mg | DELAYED_RELEASE_TABLET | Freq: Every day | ORAL | 0 refills | Status: DC

## 2016-01-10 MED ORDER — MELOXICAM 15 MG PO TABS: 15.0000 mg | ORAL_TABLET | Freq: Every day | ORAL | 2 refills | Status: DC

## 2016-01-10 NOTE — Progress Notes (Signed)
Pre visit review using our clinic review tool, if applicable. No additional management support is needed unless otherwise documented below in the visit note. 

## 2016-01-10 NOTE — Progress Notes (Signed)
   Subjective:    Patient ID: Meghan Dixon, female    DOB: 04-25-1945, 70 y.o.   MRN: [id number]  HPI Here for 3 weeks of intermittent left leg pain that starts just above the knee and extends posteriorly down to the calf. No swelling in the leg or foot. No recent trauma. No chest pain or SOB. Advil does not help.    Review of Systems  Constitutional: Negative.   Respiratory: Negative.   Cardiovascular: Negative.   Musculoskeletal: Positive for myalgias. Negative for gait problem.  Neurological: Negative.        Objective:   Physical Exam  Constitutional: She appears well-developed and well-nourished. No distress.  Cardiovascular: Normal rate, regular rhythm, normal heart sounds and intact distal pulses.   Pulmonary/Chest: Effort normal and breath sounds normal. No respiratory distress. She has no rales.  Musculoskeletal:  The left leg is slightly tender behind the knee and and in the calf. No cords are felt, Bevelyn Buckles is negative. No swelling in either leg or foot           Assessment & Plan:  Leg pain, most likely muscular in origin. Try Meloxicam and rest. We will set up a venous doppler soon to rule out a DVT. Start on ASA 81 mg daily.  [name], MD

## 2016-01-10 NOTE — Addendum Note (Signed)
Addended by: Aggie Hacker A on: 01/10/2016 03:54 PM   Modules accepted: Orders

## 2016-01-12 ENCOUNTER — Ambulatory Visit (HOSPITAL_COMMUNITY)
Admission: RE | Admit: 2016-01-12 | Discharge: 2016-01-12 | Disposition: A | Discharge status: Home / Self Care | Payer: Commercial Managed Care - HMO | Source: Ambulatory Visit | Attending: Cardiovascular Disease | Admitting: Cardiovascular Disease

## 2016-01-12 DIAGNOSIS — M79605 Pain in left leg: Secondary | ICD-10-CM

## 2016-02-08 ENCOUNTER — Ambulatory Visit
Admission: RE | Admit: 2016-02-08 | Discharge: 2016-02-08 | Disposition: A | Discharge status: Home / Self Care | Payer: Commercial Managed Care - HMO | Source: Ambulatory Visit | Attending: Family Medicine | Admitting: Family Medicine

## 2016-02-08 DIAGNOSIS — Z1231 Encounter for screening mammogram for malignant neoplasm of breast: Secondary | ICD-10-CM

## 2016-02-20 DIAGNOSIS — H5213 Myopia, bilateral: Secondary | ICD-10-CM | POA: Diagnosis not present
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
## 2016-03-27 ENCOUNTER — Ambulatory Visit (INDEPENDENT_AMBULATORY_CARE_PROVIDER_SITE_OTHER): Payer: Commercial Managed Care - HMO

## 2016-03-27 VITALS — BP 130/80 | HR 71 | Ht 65.0 in | Wt 172.0 lb

## 2016-03-27 DIAGNOSIS — Z0000 Encounter for general adult medical examination without abnormal findings: Secondary | ICD-10-CM

## 2016-03-27 DIAGNOSIS — Z7289 Other problems related to lifestyle: Secondary | ICD-10-CM

## 2016-03-27 NOTE — Patient Instructions (Addendum)
Meghan Dixon , Thank you for taking time to come for your Medicare Wellness Visit. I appreciate your ongoing commitment to your health goals. Please review the following plan we discussed and let me know if I can assist you in the future.   Medicare now request all "baby boomers" test for possible exposure to Hepatitis C. Many may have been exposed due to dental work, tatoo's, vaccinations when young. The Hepatitis C virus is dormant for many years and then sometimes will cause liver cancer. If you gave blood in the past 15 years, you were most likely checked for Hep C. If you rec'd blood; you may want to consider testing or if you are high risk for any other reason.    [facility] - can assist with hearing aid x 1  No reviews  [employer] Office  [address]  [phone]      These are the goals we discussed: Goals    . patient          May consider y if not working  To maintain your hobbies        This is a list of the screening recommended for you and due dates:  Health Maintenance  Topic Date Due  .  Hepatitis C: One time screening is recommended by Center for Disease Control  (CDC) for  adults born from [birth year] through 1963.   04-02-1946  . Mammogram  02/07/2017  . Tetanus Vaccine  08/28/2020  . Colon Cancer Screening  04/29/2021  . Flu Shot  Completed  . DEXA scan (bone density measurement)  Completed  . Shingles Vaccine  Completed  . Pneumonia vaccines  Completed        Fall Prevention in the Home Introduction Falls can cause injuries. They can happen to people of all ages. There are many things you can do to make your home safe and to help prevent falls. What can I do on the outside of my home?  Regularly fix the edges of walkways and driveways and fix any cracks.  Remove anything that might make you trip as you walk through a door, such as a raised step or threshold.  Trim any bushes or trees on the path to your  home.  Use bright outdoor lighting.  Clear any walking paths of anything that might make someone trip, such as rocks or tools.  Regularly check to see if handrails are loose or broken. Make sure that both sides of any steps have handrails.  Any raised decks and porches should have guardrails on the edges.  Have any leaves, snow, or ice cleared regularly.  Use sand or salt on walking paths during winter.  Clean up any spills in your garage right away. This includes oil or grease spills. What can I do in the bathroom?  Use night lights.  Install grab bars by the toilet and in the tub and shower. Do not use towel bars as grab bars.  Use non-skid mats or decals in the tub or shower.  If you need to sit down in the shower, use a plastic, non-slip stool.  Keep the floor dry. Clean up any water that spills on the floor as soon as it happens.  Remove soap buildup in the tub or shower regularly.  Attach bath mats securely with double-sided non-slip rug tape.  Do not have throw rugs and other things on the floor that can make you trip. What can  I do in the bedroom?  Use night lights.  Make sure that you have a light by your bed that is easy to reach.  Do not use any sheets or blankets that are too big for your bed. They should not hang down onto the floor.  Have a firm chair that has side arms. You can use this for support while you get dressed.  Do not have throw rugs and other things on the floor that can make you trip. What can I do in the kitchen?  Clean up any spills right away.  Avoid walking on wet floors.  Keep items that you use a lot in easy-to-reach places.  If you need to reach something above you, use a strong step stool that has a grab bar.  Keep electrical cords out of the way.  Do not use floor polish or wax that makes floors slippery. If you must use wax, use non-skid floor wax.  Do not have throw rugs and other things on the floor that can make you  trip. What can I do with my stairs?  Do not leave any items on the stairs.  Make sure that there are handrails on both sides of the stairs and use them. Fix handrails that are broken or loose. Make sure that handrails are as long as the stairways.  Check any carpeting to make sure that it is firmly attached to the stairs. Fix any carpet that is loose or worn.  Avoid having throw rugs at the top or bottom of the stairs. If you do have throw rugs, attach them to the floor with carpet tape.  Make sure that you have a light switch at the top of the stairs and the bottom of the stairs. If you do not have them, ask someone to add them for you. What else can I do to help prevent falls?  Wear shoes that:  Do not have high heels.  Have rubber bottoms.  Are comfortable and fit you well.  Are closed at the toe. Do not wear sandals.  If you use a stepladder:  Make sure that it is fully opened. Do not climb a closed stepladder.  Make sure that both sides of the stepladder are locked into place.  Ask someone to hold it for you, if possible.  Clearly mark and make sure that you can see:  Any grab bars or handrails.  First and last steps.  Where the edge of each step is.  Use tools that help you move around (mobility aids) if they are needed. These include:  Canes.  Walkers.  Scooters.  Crutches.  Turn on the lights when you go into a dark area. Replace any light bulbs as soon as they burn out.  Set up your furniture so you have a clear path. Avoid moving your furniture around.  If any of your floors are uneven, fix them.  If there are any pets around you, be aware of where they are.  Review your medicines with your doctor. Some medicines can make you feel dizzy. This can increase your chance of falling. Ask your doctor what other things that you can do to help prevent falls. This information is not intended to replace advice given to you by your health care provider. Make  sure you discuss any questions you have with your health care provider. Document Released: 01/27/2009 Document Revised: 09/08/2015 Document Reviewed: 05/07/2014  2017 [employer]vier  Health Maintenance, Female Introduction Adopting a healthy lifestyle and getting  preventive care can go a long way to promote health and wellness. Talk with your health care provider about what schedule of regular examinations is right for you. This is a good chance for you to check in with your provider about disease prevention and staying healthy. In between checkups, there are plenty of things you can do on your own. Experts have done a lot of research about which lifestyle changes and preventive measures are most likely to keep you healthy. Ask your health care provider for more information. Weight and diet Eat a healthy diet  Be sure to include plenty of vegetables, fruits, low-fat dairy products, and lean protein.  Do not eat a lot of foods high in solid fats, added sugars, or salt.  Get regular exercise. This is one of the most important things you can do for your health.  Most adults should exercise for at least 150 minutes each week. The exercise should increase your heart rate and make you sweat (moderate-intensity exercise).  Most adults should also do strengthening exercises at least twice a week. This is in addition to the moderate-intensity exercise. Maintain a healthy weight  Body mass index (BMI) is a measurement that can be used to identify possible weight problems. It estimates body fat based on height and weight. Your health care provider can help determine your BMI and help you achieve or maintain a healthy weight.  For females 69 years of age and older:  A BMI below 18.5 is considered underweight.  A BMI of 18.5 to 24.9 is normal.  A BMI of 25 to 29.9 is considered overweight.  A BMI of 30 and above is considered obese. Watch levels of cholesterol and blood lipids  You should start  having your blood tested for lipids and cholesterol at 68 years of age, then have this test every 5 years.  You may need to have your cholesterol levels checked more often if:  Your lipid or cholesterol levels are high.  You are older than 68 years of age.  You are at high risk for heart disease. Cancer screening Lung Cancer  Lung cancer screening is recommended for adults 75-53 years old who are at high risk for lung cancer because of a history of smoking.  A yearly low-dose CT scan of the lungs is recommended for people who:  Currently smoke.  Have quit within the past 15 years.  Have at least a 30-pack-year history of smoking. A pack year is smoking an average of one pack of cigarettes a day for 1 year.  Yearly screening should continue until it has been 15 years since you quit.  Yearly screening should stop if you develop a health problem that would prevent you from having lung cancer treatment. Breast Cancer  Practice breast self-awareness. This means understanding how your breasts normally appear and feel.  It also means doing regular breast self-exams. Let your health care provider know about any changes, no matter how small.  If you are in your 20s or 30s, you should have a clinical breast exam (CBE) by a health care provider every 1-3 years as part of a regular health exam.  If you are 24 or older, have a CBE every year. Also consider having a breast X-ray (mammogram) every year.  If you have a family history of breast cancer, talk to your health care provider about genetic screening.  If you are at high risk for breast cancer, talk to your health care provider about having an MRI  and a mammogram every year.  Breast cancer gene (BRCA) assessment is recommended for women who have family members with BRCA-related cancers. BRCA-related cancers include:  Breast.  Ovarian.  Tubal.  Peritoneal cancers.  Results of the assessment will determine the need for genetic  counseling and BRCA1 and BRCA2 testing. Cervical Cancer  Your health care provider may recommend that you be screened regularly for cancer of the pelvic organs (ovaries, uterus, and vagina). This screening involves a pelvic examination, including checking for microscopic changes to the surface of your cervix (Pap test). You may be encouraged to have this screening done every 3 years, beginning at age 45.  For women ages 49-65, health care providers may recommend pelvic exams and Pap testing every 3 years, or they may recommend the Pap and pelvic exam, combined with testing for human papilloma virus (HPV), every 5 years. Some types of HPV increase your risk of cervical cancer. Testing for HPV may also be done on women of any age with unclear Pap test results.  Other health care providers may not recommend any screening for nonpregnant women who are considered low risk for pelvic cancer and who do not have symptoms. Ask your health care provider if a screening pelvic exam is right for you.  If you have had past treatment for cervical cancer or a condition that could lead to cancer, you need Pap tests and screening for cancer for at least 20 years after your treatment. If Pap tests have been discontinued, your risk factors (such as having a new sexual partner) need to be reassessed to determine if screening should resume. Some women have medical problems that increase the chance of getting cervical cancer. In these cases, your health care provider may recommend more frequent screening and Pap tests. Colorectal Cancer  This type of cancer can be detected and often prevented.  Routine colorectal cancer screening usually begins at 68 years of age and continues through 68 years of age.  Your health care provider may recommend screening at an earlier age if you have risk factors for colon cancer.  Your health care provider may also recommend using home test kits to check for hidden blood in the stool.  A  small camera at the end of a tube can be used to examine your colon directly (sigmoidoscopy or colonoscopy). This is done to check for the earliest forms of colorectal cancer.  Routine screening usually begins at age 42.  Direct examination of the colon should be repeated every 5-10 years through 68 years of age. However, you may need to be screened more often if early forms of precancerous polyps or small growths are found. Skin Cancer  Check your skin from head to toe regularly.  Tell your health care provider about any new moles or changes in moles, especially if there is a change in a mole's shape or color.  Also tell your health care provider if you have a mole that is larger than the size of a pencil eraser.  Always use sunscreen. Apply sunscreen liberally and repeatedly throughout the day.  Protect yourself by wearing long sleeves, pants, a wide-brimmed hat, and sunglasses whenever you are outside. Heart disease, diabetes, and high blood pressure  High blood pressure causes heart disease and increases the risk of stroke. High blood pressure is more likely to develop in:  People who have blood pressure in the high end of the normal range (130-139/85-89 mm Hg).  People who are overweight or obese.  People who  are African American.  If you are 72-72 years of age, have your blood pressure checked every 3-5 years. If you are 68 years of age or older, have your blood pressure checked every year. You should have your blood pressure measured twice-once when you are at a hospital or clinic, and once when you are not at a hospital or clinic. Record the average of the two measurements. To check your blood pressure when you are not at a hospital or clinic, you can use:  An automated blood pressure machine at a pharmacy.  A home blood pressure monitor.  If you are between 28 years and 5 years old, ask your health care provider if you should take aspirin to prevent strokes.  Have regular  diabetes screenings. This involves taking a blood sample to check your fasting blood sugar level.  If you are at a normal weight and have a low risk for diabetes, have this test once every three years after 68 years of age.  If you are overweight and have a high risk for diabetes, consider being tested at a younger age or more often. Preventing infection Hepatitis B  If you have a higher risk for hepatitis B, you should be screened for this virus. You are considered at high risk for hepatitis B if:  You were born in a country where hepatitis B is common. Ask your health care provider which countries are considered high risk.  Your parents were born in a high-risk country, and you have not been immunized against hepatitis B (hepatitis B vaccine).  You have HIV or AIDS.  You use needles to inject street drugs.  You live with someone who has hepatitis B.  You have had sex with someone who has hepatitis B.  You get hemodialysis treatment.  You take certain medicines for conditions, including cancer, organ transplantation, and autoimmune conditions. Hepatitis C  Blood testing is recommended for:  Everyone born from [birth year] through 1965.  Anyone with known risk factors for hepatitis C. Sexually transmitted infections (STIs)  You should be screened for sexually transmitted infections (STIs) including gonorrhea and chlamydia if:  You are sexually active and are younger than 68 years of age.  You are older than 68 years of age and your health care provider tells you that you are at risk for this type of infection.  Your sexual activity has changed since you were last screened and you are at an increased risk for chlamydia or gonorrhea. Ask your health care provider if you are at risk.  If you do not have HIV, but are at risk, it may be recommended that you take a prescription medicine daily to prevent HIV infection. This is called pre-exposure prophylaxis (PrEP). You are considered at  risk if:  You are sexually active and do not regularly use condoms or know the HIV status of your partner(s).  You take drugs by injection.  You are sexually active with a partner who has HIV. Talk with your health care provider about whether you are at high risk of being infected with HIV. If you choose to begin PrEP, you should first be tested for HIV. You should then be tested every 3 months for as long as you are taking PrEP. Pregnancy  If you are premenopausal and you may become pregnant, ask your health care provider about preconception counseling.  If you may become pregnant, take 400 to 800 micrograms (mcg) of folic acid every day.  If you want to prevent pregnancy,  talk to your health care provider about birth control (contraception). Osteoporosis and menopause  Osteoporosis is a disease in which the bones lose minerals and strength with aging. This can result in serious bone fractures. Your risk for osteoporosis can be identified using a bone density scan.  If you are 84 years of age or older, or if you are at risk for osteoporosis and fractures, ask your health care provider if you should be screened.  Ask your health care provider whether you should take a calcium or vitamin D supplement to lower your risk for osteoporosis.  Menopause may have certain physical symptoms and risks.  Hormone replacement therapy may reduce some of these symptoms and risks. Talk to your health care provider about whether hormone replacement therapy is right for you. Follow these instructions at home:  Schedule regular health, dental, and eye exams.  Stay current with your immunizations.  Do not use any tobacco products including cigarettes, chewing tobacco, or electronic cigarettes.  If you are pregnant, do not drink alcohol.  If you are breastfeeding, limit how much and how often you drink alcohol.  Limit alcohol intake to no more than 1 drink per day for nonpregnant women. One drink  equals 12 ounces of beer, 5 ounces of wine, or 1 ounces of hard liquor.  Do not use street drugs.  Do not share needles.  Ask your health care provider for help if you need support or information about quitting drugs.  Tell your health care provider if you often feel depressed.  Tell your health care provider if you have ever been abused or do not feel safe at home. This information is not intended to replace advice given to you by your health care provider. Make sure you discuss any questions you have with your health care provider. Document Released: 10/16/2010 Document Revised: 09/08/2015 Document Reviewed: 01/04/2015  2017 [employer]vier

## 2016-04-21 ENCOUNTER — Ambulatory Visit (INDEPENDENT_AMBULATORY_CARE_PROVIDER_SITE_OTHER): Payer: Commercial Managed Care - HMO | Admitting: Internal Medicine

## 2016-04-21 ENCOUNTER — Encounter: Payer: Self-pay | Admitting: Internal Medicine

## 2016-04-21 DIAGNOSIS — B9789 Other viral agents as the cause of diseases classified elsewhere: Secondary | ICD-10-CM | POA: Diagnosis not present

## 2016-04-21 DIAGNOSIS — J069 Acute upper respiratory infection, unspecified: Secondary | ICD-10-CM | POA: Diagnosis not present

## 2016-04-21 MED ORDER — LEVOCETIRIZINE DIHYDROCHLORIDE 5 MG PO TABS: 5.0000 mg | ORAL_TABLET | Freq: Every evening | ORAL | 0 refills | Status: DC

## 2016-04-21 NOTE — Assessment & Plan Note (Signed)
Tessalon perles did not help in past and allergic to codeine products. Advised to try delysm over the counter. Rx for xyzal for the congestion. Given expected course and management for viral illness including likely course of 7-10 days with peak symptoms day 4-5. No antibiotic or steroid indicated today.

## 2016-04-21 NOTE — Progress Notes (Signed)
   Subjective:    Patient ID: Meghan Dixon, female    DOB: 1945-11-30, 71 y.o.   MRN: [id number]  HPI The patient is a 71 YO female coming in for cough and cold symptoms. Going on for 2-3 days. No chills but some mild fever at home. No sick contacts but works around [employer]. Has tried some over the counter medicine which was not helpful. She is coughing which is not productive. No SOB. Fatigue and some mild muscle ache. Congestion in her nose and ears. She is not taking any allergy medication.   Review of Systems  Constitutional: Positive for activity change and fever. Negative for appetite change, chills and unexpected weight change.  HENT: Positive for congestion, ear pain, rhinorrhea and sinus pressure. Negative for dental problem, drooling, ear discharge, postnasal drip, sinus pain, sore throat and trouble swallowing.   Eyes: Negative.   Respiratory: Positive for cough. Negative for chest tightness, shortness of breath and wheezing.   Cardiovascular: Negative.   Gastrointestinal: Negative.   Musculoskeletal: Positive for myalgias.      Objective:   Physical Exam  Constitutional: She is oriented to person, place, and time. She appears well-developed and well-nourished.  HENT:  Head: Normocephalic and atraumatic.  Tms normal bilaterally, nose without crusting, no sinus pressure, oropharynx with redness and clear drainage.   Eyes: EOM are normal.  Neck: Normal range of motion.  Cardiovascular: Normal rate and regular rhythm.   Pulmonary/Chest: Effort normal and breath sounds normal. No respiratory distress. She has no wheezes. She has no rales.  Abdominal: Soft.  Neurological: She is alert and oriented to person, place, and time.  Skin: Skin is warm and dry.   Vitals:   04/21/16 1045  BP: 130/72  Pulse: 87  Temp: 99.5 F (37.5 C)  TempSrc: Oral  SpO2: 93%  Weight: 172 lb (78 kg)      Assessment & Plan:

## 2016-04-21 NOTE — Patient Instructions (Signed)
We have sent in xyzal for you to try for the drainage. Take 1 pill daily.   Try the delsym over the counter for the cough.   Upper Respiratory Infection, Adult Most upper respiratory infections (URIs) are a viral infection of the air passages leading to the lungs. A URI affects the nose, throat, and upper air passages. The most common type of URI is nasopharyngitis and is typically referred to as "the common cold." URIs run their course and usually go away on their own. Most of the time, a URI does not require medical attention, but sometimes a bacterial infection in the upper airways can follow a viral infection. This is called a secondary infection. Sinus and middle ear infections are common types of secondary upper respiratory infections. Bacterial pneumonia can also complicate a URI. A URI can worsen asthma and chronic obstructive pulmonary disease (COPD). Sometimes, these complications can require emergency medical care and may be life threatening. What are the causes? Almost all URIs are caused by viruses. A virus is a type of germ and can spread from one person to another. What increases the risk? You may be at risk for a URI if:  You smoke.  You have chronic heart or lung disease.  You have a weakened defense (immune) system.  You are very young or very old.  You have nasal allergies or asthma.  You work in crowded or poorly ventilated areas.  You work in health care facilities or schools. What are the signs or symptoms? Symptoms typically develop 2-3 days after you come in contact with a cold virus. Most viral URIs last 7-10 days. However, viral URIs from the influenza virus (flu virus) can last 14-18 days and are typically more severe. Symptoms may include:  Runny or stuffy (congested) nose.  Sneezing.  Cough.  Sore throat.  Headache.  Fatigue.  Fever.  Loss of appetite.  Pain in your forehead, behind your eyes, and over your cheekbones (sinus pain).  Muscle  aches. How is this diagnosed? Your health care provider may diagnose a URI by:  Physical exam.  Tests to check that your symptoms are not due to another condition such as:  Strep throat.  Sinusitis.  Pneumonia.  Asthma. How is this treated? A URI goes away on its own with time. It cannot be cured with medicines, but medicines may be prescribed or recommended to relieve symptoms. Medicines may help:  Reduce your fever.  Reduce your cough.  Relieve nasal congestion. Follow these instructions at home:  Take medicines only as directed by your health care provider.  Gargle warm saltwater or take cough drops to comfort your throat as directed by your health care provider.  Use a warm mist humidifier or inhale steam from a shower to increase air moisture. This may make it easier to breathe.  Drink enough fluid to keep your urine clear or pale yellow.  Eat soups and other clear broths and maintain good nutrition.  Rest as needed.  Return to work when your temperature has returned to normal or as your health care provider advises. You may need to stay home longer to avoid infecting others. You can also use a face mask and careful hand washing to prevent spread of the virus.  Increase the usage of your inhaler if you have asthma.  Do not use any tobacco products, including cigarettes, chewing tobacco, or electronic cigarettes. If you need help quitting, ask your health care provider. How is this prevented? The best way to  protect yourself from getting a cold is to practice good hygiene.  Avoid oral or hand contact with people with cold symptoms.  Wash your hands often if contact occurs. There is no clear evidence that vitamin C, vitamin E, echinacea, or exercise reduces the chance of developing a cold. However, it is always recommended to get plenty of rest, exercise, and practice good nutrition. Contact a health care provider if:  You are getting worse rather than  better.  Your symptoms are not controlled by medicine.  You have chills.  You have worsening shortness of breath.  You have brown or red mucus.  You have yellow or brown nasal discharge.  You have pain in your face, especially when you bend forward.  You have a fever.  You have swollen neck glands.  You have pain while swallowing.  You have white areas in the back of your throat. Get help right away if:  You have severe or persistent:  Headache.  Ear pain.  Sinus pain.  Chest pain.  You have chronic lung disease and any of the following:  Wheezing.  Prolonged cough.  Coughing up blood.  A change in your usual mucus.  You have a stiff neck.  You have changes in your:  Vision.  Hearing.  Thinking.  Mood. This information is not intended to replace advice given to you by your health care provider. Make sure you discuss any questions you have with your health care provider. Document Released: 09/26/2000 Document Revised: 12/04/2015 Document Reviewed: 07/08/2013 Elsevier Interactive Patient Education  2017 [employer].

## 2016-05-18 ENCOUNTER — Other Ambulatory Visit: Payer: Self-pay | Admitting: Internal Medicine

## 2016-06-04 ENCOUNTER — Telehealth: Payer: Self-pay | Admitting: Family Medicine

## 2016-06-04 NOTE — Telephone Encounter (Signed)
Pt was with the granddaughter this weekend and the G-daughter was Dx with the influenza and would like to have Tamiflu   Pharm:  [facility] [place]. [address].

## 2016-06-04 NOTE — Telephone Encounter (Signed)
Call in Tamiflu 75 mg bid for 5 days  

## 2016-06-05 ENCOUNTER — Other Ambulatory Visit (INDEPENDENT_AMBULATORY_CARE_PROVIDER_SITE_OTHER): Payer: Medicare HMO

## 2016-06-05 DIAGNOSIS — Z0000 Encounter for general adult medical examination without abnormal findings: Secondary | ICD-10-CM

## 2016-06-05 LAB — CBC WITH DIFFERENTIAL/PLATELET
Basophils Absolute: 0 10*3/uL (ref 0.0–0.1)
Basophils Relative: 0.6 % (ref 0.0–3.0)
EOS PCT: 4 % (ref 0.0–5.0)
Eosinophils Absolute: 0.2 10*3/uL (ref 0.0–0.7)
HEMATOCRIT: 38.6 % (ref 36.0–46.0)
Hemoglobin: 13.1 g/dL (ref 12.0–15.0)
LYMPHS ABS: 1.4 10*3/uL (ref 0.7–4.0)
LYMPHS PCT: 24.1 % (ref 12.0–46.0)
MCHC: 33.9 g/dL (ref 30.0–36.0)
MCV: 90.7 fl (ref 78.0–100.0)
MONOS PCT: 9.3 % (ref 3.0–12.0)
Monocytes Absolute: 0.5 10*3/uL (ref 0.1–1.0)
NEUTROS ABS: 3.5 10*3/uL (ref 1.4–7.7)
NEUTROS PCT: 62 % (ref 43.0–77.0)
PLATELETS: 261 10*3/uL (ref 150.0–400.0)
RBC: 4.25 Mil/uL (ref 3.87–5.11)
RDW: 13.7 % (ref 11.5–15.5)
WBC: 5.6 10*3/uL (ref 4.0–10.5)

## 2016-06-05 LAB — LIPID PANEL
CHOL/HDL RATIO: 3
Cholesterol: 185 mg/dL (ref 0–200)
HDL: 54.7 mg/dL (ref >39.00)
LDL CALC: 116 mg/dL — AB (ref 0–99)
NONHDL: 129.93
TRIGLYCERIDES: 70 mg/dL (ref 0.0–149.0)
VLDL: 14 mg/dL (ref 0.0–40.0)

## 2016-06-05 LAB — POC URINALSYSI DIPSTICK (AUTOMATED)
BILIRUBIN UA: NEGATIVE
GLUCOSE UA: NEGATIVE
Ketones, UA: NEGATIVE
NITRITE UA: NEGATIVE
PH UA: 6.5
Protein, UA: NEGATIVE
RBC UA: NEGATIVE
SPEC GRAV UA: 1.02
Urobilinogen, UA: 0.2

## 2016-06-05 LAB — BASIC METABOLIC PANEL
BUN: 14 mg/dL (ref 6–23)
CHLORIDE: 106 meq/L (ref 96–112)
CO2: 30 meq/L (ref 19–32)
Calcium: 9.3 mg/dL (ref 8.4–10.5)
Creatinine, Ser: 0.68 mg/dL (ref 0.40–1.20)
GFR: 91.32 mL/min (ref >60.00)
GLUCOSE: 98 mg/dL (ref 70–99)
POTASSIUM: 4 meq/L (ref 3.5–5.1)
SODIUM: 141 meq/L (ref 135–145)

## 2016-06-05 LAB — HEPATIC FUNCTION PANEL
ALBUMIN: 4.2 g/dL (ref 3.5–5.2)
ALK PHOS: 56 U/L (ref 39–117)
ALT: 18 U/L (ref 0–35)
AST: 20 U/L (ref 0–37)
Bilirubin, Direct: 0.1 mg/dL (ref 0.0–0.3)
TOTAL PROTEIN: 6.8 g/dL (ref 6.0–8.3)
Total Bilirubin: 0.5 mg/dL (ref 0.2–1.2)

## 2016-06-05 LAB — TSH: TSH: 1.13 u[IU]/mL (ref 0.35–4.50)

## 2016-06-05 MED ORDER — OSELTAMIVIR PHOSPHATE 75 MG PO CAPS: 75.0000 mg | ORAL_CAPSULE | Freq: Two times a day (BID) | ORAL | 0 refills | Status: DC

## 2016-06-05 NOTE — Telephone Encounter (Signed)
Tamiful 75 mg PO BID x 5 days [facility] to [facility]

## 2016-06-12 ENCOUNTER — Encounter: Payer: Self-pay | Admitting: Family Medicine

## 2016-06-12 ENCOUNTER — Ambulatory Visit (INDEPENDENT_AMBULATORY_CARE_PROVIDER_SITE_OTHER): Payer: Medicare HMO | Admitting: Family Medicine

## 2016-06-12 VITALS — BP 126/87 | HR 79 | Temp 98.4°F | Ht 65.0 in | Wt 172.0 lb

## 2016-06-12 DIAGNOSIS — Z0000 Encounter for general adult medical examination without abnormal findings: Secondary | ICD-10-CM

## 2016-06-12 MED ORDER — LEVOTHYROXINE SODIUM 112 MCG PO TABS: 112.0000 ug | ORAL_TABLET | Freq: Every day | ORAL | 3 refills | Status: DC

## 2016-06-12 MED ORDER — AMLODIPINE BESYLATE 5 MG PO TABS: 5.0000 mg | ORAL_TABLET | Freq: Every day | ORAL | 3 refills | Status: DC

## 2016-06-12 NOTE — Progress Notes (Signed)
Pre visit review using our clinic review tool, if applicable. No additional management support is needed unless otherwise documented below in the visit note. 

## 2016-06-12 NOTE — Progress Notes (Signed)
   Subjective:    Patient ID: Meghan Dixon, female    DOB: Mar 11, 1946, 71 y.o.   MRN: [id number]  HPI 71 yr old female for a well exam. She feels well.    Review of Systems  Constitutional: Negative.   HENT: Negative.   Eyes: Negative.   Respiratory: Negative.   Cardiovascular: Negative.   Gastrointestinal: Negative.   Genitourinary: Negative for decreased urine volume, difficulty urinating, dyspareunia, dysuria, enuresis, flank pain, frequency, hematuria, pelvic pain and urgency.  Musculoskeletal: Negative.   Skin: Negative.   Neurological: Negative.   Psychiatric/Behavioral: Negative.        Objective:   Physical Exam  Constitutional: She is oriented to person, place, and time. She appears well-developed and well-nourished. No distress.  HENT:  Head: Normocephalic and atraumatic.  Right Ear: External ear normal.  Left Ear: External ear normal.  Nose: Nose normal.  Mouth/Throat: Oropharynx is clear and moist. No oropharyngeal exudate.  Eyes: Conjunctivae and EOM are normal. Pupils are equal, round, and reactive to light. No scleral icterus.  Neck: Normal range of motion. Neck supple. No JVD present. No thyromegaly present.  Cardiovascular: Normal rate, regular rhythm, normal heart sounds and intact distal pulses.  Exam reveals no gallop and no friction rub.   No murmur heard. Pulmonary/Chest: Effort normal and breath sounds normal. No respiratory distress. She has no wheezes. She has no rales. She exhibits no tenderness.  Abdominal: Soft. Bowel sounds are normal. She exhibits no distension and no mass. There is no tenderness. There is no rebound and no guarding.  Musculoskeletal: Normal range of motion. She exhibits no edema or tenderness.  Lymphadenopathy:    She has no cervical adenopathy.  Neurological: She is alert and oriented to person, place, and time. She has normal reflexes. No cranial nerve deficit. She exhibits normal muscle tone. Coordination normal.  Skin:  Skin is warm and dry. No rash noted. No erythema.  Psychiatric: She has a normal mood and affect. Her behavior is normal. Judgment and thought content normal.          Assessment & Plan:  Well exam. We discussed diet and exercise.  [name], MD

## 2016-07-18 ENCOUNTER — Other Ambulatory Visit: Payer: Self-pay | Admitting: Family Medicine

## 2016-08-08 ENCOUNTER — Telehealth: Payer: Self-pay | Admitting: Family Medicine

## 2016-08-08 DIAGNOSIS — M79673 Pain in unspecified foot: Secondary | ICD-10-CM

## 2016-08-08 NOTE — Telephone Encounter (Signed)
Left a message on identified mailbox informing the pt that referral has been placed and she will be contacted for an important

## 2016-08-08 NOTE — Telephone Encounter (Signed)
Pt would like to have a referral for her feet to go and see Dr. Wylene Simmer @ [facility]

## 2016-08-08 NOTE — Telephone Encounter (Signed)
The referral was done  

## 2016-09-14 DIAGNOSIS — M19071 Primary osteoarthritis, right ankle and foot: Secondary | ICD-10-CM | POA: Diagnosis not present

## 2016-09-14 DIAGNOSIS — M79671 Pain in right foot: Secondary | ICD-10-CM | POA: Diagnosis not present

## 2016-09-14 DIAGNOSIS — M7751 Other enthesopathy of right foot: Secondary | ICD-10-CM | POA: Diagnosis not present

## 2016-09-14 DIAGNOSIS — M79672 Pain in left foot: Secondary | ICD-10-CM | POA: Diagnosis not present

## 2016-09-26 DIAGNOSIS — Z0100 Encounter for examination of eyes and vision without abnormal findings: Secondary | ICD-10-CM | POA: Diagnosis not present

## 2017-01-03 ENCOUNTER — Encounter: Payer: Self-pay | Admitting: Maternal Newborn

## 2017-01-03 ENCOUNTER — Ambulatory Visit (INDEPENDENT_AMBULATORY_CARE_PROVIDER_SITE_OTHER): Payer: Medicare HMO | Admitting: Maternal Newborn

## 2017-01-03 ENCOUNTER — Ambulatory Visit: Payer: Self-pay | Admitting: Obstetrics & Gynecology

## 2017-01-03 VITALS — BP 138/70 | HR 72 | Ht 65.0 in | Wt 175.0 lb

## 2017-01-03 DIAGNOSIS — Z01419 Encounter for gynecological examination (general) (routine) without abnormal findings: Secondary | ICD-10-CM | POA: Diagnosis not present

## 2017-01-03 NOTE — Progress Notes (Signed)
Gynecology Annual Exam  PCP: [name], MD  Chief Complaint:  Chief Complaint  Patient presents with  . Gynecologic Exam    NP; no problems    History of Present Illness:Patient is a 71 y.o. G2P1011 presents for annual exam. The patient has no complaints today.   LMP: No LMP recorded. Patient has had a hysterectomy. Postcoital Bleeding: no Dysmenorrhea: not applicable  The patient is sexually active. She admits to dyspareunia.  The patient does not perform self breast exams.  There is no notable family history of breast or ovarian cancer in her family.  The patient wears seatbelts: yes.   The patient has regular exercise: yes.    The patient denies current symptoms of depression.     Review of Systems  Constitutional: Negative.   HENT: Negative.   Eyes: Negative.   Respiratory: Negative for cough, shortness of breath and wheezing.   Cardiovascular: Negative for chest pain and palpitations.  Gastrointestinal: Negative for abdominal pain, blood in stool, constipation, diarrhea and nausea.  Genitourinary: Negative.   Musculoskeletal: Positive for joint pain. Negative for falls.  Skin: Negative.   Neurological: Negative.   Endo/Heme/Allergies: Negative.   Psychiatric/Behavioral: Negative.   All other systems reviewed and are negative.   Past Medical History:  Past Medical History:  Diagnosis Date  . Allergy    allergic rhinitis  . Arthritis    foot  . HSV infection   . Hypertension   . Hyperthyroidism   . Osteopenia     Past Surgical History:  Past Surgical History:  Procedure Laterality Date  . ABDOMINAL HYSTERECTOMY     for bleeding and ovarian cyst  . APPENDECTOMY    . CARPAL TUNNEL RELEASE     left  . COLONOSCOPY  04-30-11   per Dr. Ardis Hughs, no polyps, repeat in 10 yrs   . ESOPHAGOGASTRODUODENOSCOPY  04-30-11   per Dr. Ardis Hughs, gastritis, H pylori neg   . FOOT SURGERY     bilateral  . TONSILLECTOMY      Gynecologic History:  No LMP recorded.  Patient has had a hysterectomy. Last Pap: 2013 Results were: no abnormalities  Last mammogram: 02/09/2016 Results were: BI-RAD I Obstetric History: G2P1011  Family History:  Family History  Problem Relation Age of Onset  . Heart disease Mother   . Cancer Father        esophageal CA  . Alcohol abuse Father   . Arthritis Sister        RA  . Heart disease Brother        CAD  . Colon cancer Brother 59  . Lung cancer Brother   . Stomach cancer Neg Hx     Social History:  Social History   Social History  . Marital status: Married    Spouse name: N/A  . Number of children: 1  . Years of education: 10   Occupational History  . [employer]   Social History Main Topics  . Smoking status: Never Smoker  . Smokeless tobacco: Never Used  . Alcohol use No  . Drug use: No  . Sexual activity: Yes    Birth control/ protection: Post-menopausal   Other Topics Concern  . Not on file   Social History Narrative   Drinks about 4 caffeinated drinks a day    Allergies:  Allergies  Allergen Reactions  . Augmentin [Amoxicillin-Pot Clavulanate] Other (See Comments)    Extreme heartburn  . Beta Adrenergic Blockers  REACTION: reaction not known  . Cefuroxime Axetil Hives    Throat closes  . Cetirizine Hcl     REACTION: dizzy  . Fexofenadine     REACTION: dizzy  . Prednisone     Mood changer    Medications: Prior to Admission medications   Medication Sig Start Date End Date Taking? Authorizing Provider  amLODipine (NORVASC) 5 MG tablet Take 1 tablet (5 mg total) by mouth daily. 06/12/16  Yes [name], MD  Cholecalciferol (VITAMIN D) 2000 units tablet Take 2,000 Units by mouth daily.   Yes [provider]  Glucosamine-Chondroit-Vit C-Mn (GLUCOSAMINE 1500 COMPLEX PO) Take 1 tablet by mouth 2 (two) times daily.   Yes [provider]  levothyroxine (SYNTHROID, LEVOTHROID) 112 MCG tablet Take 1 tablet (112 mcg total) by mouth daily. 06/12/16   Yes [name], MD  meloxicam (MOBIC) 15 MG tablet TAKE 1 TABLET (15 MG TOTAL) BY MOUTH DAILY. 07/19/16  Yes [name], MD  Multiple Vitamin (MULTIVITAMIN) capsule Take 1 capsule by mouth daily.     Yes [provider]    Physical Exam Vitals: Blood pressure 138/70, pulse 72, height 5\' 5"  (1.651 m), weight 175 lb (79.4 kg).  General: NAD HEENT: normocephalic, anicteric Thyroid: no enlargement, no palpable nodules Pulmonary: No increased work of breathing, CTAB Cardiovascular: RRR, distal pulses 2+ Breast: Breast symmetrical, no tenderness, no palpable nodules or masses, no skin or nipple retraction present, no nipple discharge.  No axillary or supraclavicular lymphadenopathy. Abdomen: NABS, soft, non-tender, non-distended.  Umbilicus without lesions.  No hepatomegaly, splenomegaly or masses palpable. No evidence of hernia  Genitourinary:  External: Normal external female genitalia.  Normal urethral meatus, normal  Bartholin's and Skene's glands.    Vagina: Normal vaginal mucosa, no evidence of prolapse.    Uterus: Non-enlarged, mobile, normal contour.  No CMT  Adnexa: ovaries non-enlarged, no adnexal masses  Rectal: deferred  Lymphatic: no evidence of inguinal lymphadenopathy Extremities: no edema, erythema, or tenderness Neurologic: Grossly intact Psychiatric: mood appropriate, affect full   Assessment: 71 y.o. G2P1011 routine annual exam  Plan: Problem List Items Addressed This Visit        Visit Diagnoses    Encounter for annual routine gynecological examination    -  Primary      1) Mammogram - recommend yearly screening mammogram.  Mammogram will be scheduled by patient in [place].  2) STI screening was offered and declined  3) ASCCP guidelines and rationale discussed.  Patient opts for discontinue screening due to no prior abnormal results as per ASCCP for her age.  4) Osteoporosis  - per USPTF routine screening DEXA at age 74 - patient had scan  05/02/2015, results showed osteopenia - FRAX 10 year major fracture risk 10.2% ,  10 year hip fracture risk 1.5% - will complete scan next year  5) Routine healthcare maintenance including cholesterol, diabetes screening discussed managed by PCP  6) Colonoscopy up to date per patient.  Screening recommended starting at age 56 for average risk individuals, age 39 for individuals deemed at increased risk (including African Americans) and recommended to continue until age 44.  For patient age 28-85 individualized approach is recommended.  Gold standard screening is via colonoscopy, Cologuard screening is an acceptable alternative for patient unwilling or unable to undergo colonoscopy.  "Colorectal cancer screening for average?risk adults: 2018 guideline update from the American Cancer Society"CA: A Cancer Journal for Clinicians: Sep 12, 2016   7) Discussed OTC products available to  help with painful intercourse. No estrogen products recommended d/t HTN.  8) Follow up 1 year for routine annual.  Avel Sensor, CNM 01/03/2017  5:37 PM

## 2017-01-08 ENCOUNTER — Other Ambulatory Visit: Payer: Self-pay | Admitting: Family Medicine

## 2017-01-08 DIAGNOSIS — Z1231 Encounter for screening mammogram for malignant neoplasm of breast: Secondary | ICD-10-CM

## 2017-02-06 ENCOUNTER — Ambulatory Visit (INDEPENDENT_AMBULATORY_CARE_PROVIDER_SITE_OTHER): Payer: Medicare HMO | Admitting: Family Medicine

## 2017-02-06 DIAGNOSIS — Z23 Encounter for immunization: Secondary | ICD-10-CM

## 2017-02-08 ENCOUNTER — Ambulatory Visit
Admission: RE | Admit: 2017-02-08 | Discharge: 2017-02-08 | Disposition: A | Discharge status: Home / Self Care | Payer: Medicare HMO | Source: Ambulatory Visit | Attending: Family Medicine | Admitting: Family Medicine

## 2017-02-08 DIAGNOSIS — Z1231 Encounter for screening mammogram for malignant neoplasm of breast: Secondary | ICD-10-CM

## 2017-03-27 ENCOUNTER — Other Ambulatory Visit: Payer: Self-pay | Admitting: Family Medicine

## 2017-03-27 NOTE — Telephone Encounter (Signed)
Pt is due for an OV soon for more refills.

## 2017-05-30 ENCOUNTER — Other Ambulatory Visit: Payer: Self-pay | Admitting: Family Medicine

## 2017-06-04 ENCOUNTER — Encounter: Payer: Medicare HMO | Admitting: Family Medicine

## 2017-06-12 ENCOUNTER — Encounter: Payer: Self-pay | Admitting: Family Medicine

## 2017-06-12 ENCOUNTER — Ambulatory Visit (INDEPENDENT_AMBULATORY_CARE_PROVIDER_SITE_OTHER): Payer: Medicare HMO | Admitting: Family Medicine

## 2017-06-12 VITALS — BP 132/78 | HR 68 | Temp 97.9°F | Wt 171.0 lb

## 2017-06-12 DIAGNOSIS — J209 Acute bronchitis, unspecified: Secondary | ICD-10-CM

## 2017-06-12 MED ORDER — CLARITHROMYCIN 500 MG PO TABS: 500.0000 mg | ORAL_TABLET | Freq: Two times a day (BID) | ORAL | 0 refills | Status: DC

## 2017-06-12 NOTE — Progress Notes (Signed)
   Subjective:    Patient ID: Romelle Starcher, female    DOB: 11/04/1945, 72 y.o.   MRN: [id number]  HPI Here for 2 weeks of chest congestion and coughing up yellow sputum. No fever. On Mucinex.    Review of Systems  Constitutional: Negative.   HENT: Negative.   Eyes: Negative.   Respiratory: Positive for cough and chest tightness. Negative for shortness of breath and wheezing.   Cardiovascular: Negative.        Objective:   Physical Exam  Constitutional: She appears well-developed and well-nourished.  HENT:  Right Ear: External ear normal.  Left Ear: External ear normal.  Nose: Nose normal.  Mouth/Throat: Oropharynx is clear and moist.  Eyes: Conjunctivae are normal.  Neck: No thyromegaly present.  Cardiovascular: Normal rate, regular rhythm, normal heart sounds and intact distal pulses.  Pulmonary/Chest: Effort normal and breath sounds normal. No respiratory distress. She has no wheezes. She has no rales.  Lymphadenopathy:    She has no cervical adenopathy.          Assessment & Plan:  Bronchitis, given Biaxin.  [name], MD

## 2017-06-26 DIAGNOSIS — H16302 Unspecified interstitial keratitis, left eye: Secondary | ICD-10-CM | POA: Diagnosis not present

## 2017-08-20 NOTE — Progress Notes (Addendum)
Subjective:   Meghan Dixon is a 72 y.o. female who presents for Medicare Annual (Subsequent) preventive examination.  Reports health as good Last OV 06/12/2017 Preventive health 12/2016 by Dr. Patrcia Dolly CNM who reviewed all her preventive health  Diet BMI 29  This year 28.5  Breakfast eggs, deer sausage;  Bacon occasionally BLT's with tomatoes Lunch; is the big meal Normally meat and vegetables Supper Bakes some  Snacks popcorn Candy   Exercise Cooking, cleaning etc [employer] x 2 per week;  Water aerobics x 2  Has 2 different teachers; one more cardio Has flowers beds  Canned food    Health Maintenance Due  Topic Date Due  . Hepatitis C Screening  1945/04/26   Postponed due to insurance not covering   Colonoscopy due 2023 Mammogram 01/2017  Dexa  05/02/2015  -1.8  See's GYN in [place] - may follow the above  Educated regarding shingrix        Objective:     Vitals: BP 136/80   Pulse 72   Ht 5\' 5"  (1.651 m)   Wt 173 lb (78.5 kg)   SpO2 97%   BMI 28.79 kg/m   Body mass index is 28.79 kg/m.  Advanced Directives 03/27/2016  Does Patient Have a Medical Advance Directive? Yes  Type of Paramedic of [place];Living will  Copy of [place] in Chart? No - copy requested    Tobacco Social History   Tobacco Use  Smoking Status Never Smoker  Smokeless Tobacco Never Used     Counseling given: Yes   Clinical Intake:      Past Medical History:  Diagnosis Date  . Allergy    allergic rhinitis  . Arthritis    foot  . HSV infection   . Hypertension   . Hyperthyroidism   . Osteopenia    Past Surgical History:  Procedure Laterality Date  . ABDOMINAL HYSTERECTOMY     for bleeding and ovarian cyst  . APPENDECTOMY    . CARPAL TUNNEL RELEASE     left  . COLONOSCOPY  04-30-11   per Dr. Ardis Hughs, no polyps, repeat in 10 yrs   . ESOPHAGOGASTRODUODENOSCOPY  04-30-11   per Dr. Ardis Hughs, gastritis, H pylori neg   .  FOOT SURGERY     bilateral  . TONSILLECTOMY     Family History  Problem Relation Age of Onset  . Heart disease Mother   . Cancer Father        esophageal CA  . Alcohol abuse Father   . Arthritis Sister        RA  . Heart disease Brother        CAD  . Colon cancer Brother 53  . Lung cancer Brother   . Stomach cancer Neg Hx    Social History   Socioeconomic History  . Marital status: Married    Spouse name: Not on file  . Number of children: 1  . Years of education: 27  . Highest education level: Not on file  Occupational History  . Occupation: Cytogeneticist: [employer]  Social Needs  . Financial resource strain: Not on file  . Food insecurity:    Worry: Not on file    Inability: Not on file  . Transportation needs:    Medical: Not on file    Non-medical: Not on file  Tobacco Use  . Smoking status: Never Smoker  . Smokeless tobacco: Never Used  Substance  and Sexual Activity  . Alcohol use: No    Alcohol/week: 0.0 oz  . Drug use: No  . Sexual activity: Yes    Birth control/protection: Post-menopausal  Lifestyle  . Physical activity:    Days per week: Not on file    Minutes per session: Not on file  . Stress: Not on file  Relationships  . Social connections:    Talks on phone: Not on file    Gets together: Not on file    Attends religious service: Not on file    Active member of club or organization: Not on file    Attends meetings of clubs or organizations: Not on file    Relationship status: Not on file  Other Topics Concern  . Not on file  Social History Narrative   Drinks about 4 caffeinated drinks a day    Outpatient Encounter Medications as of 08/21/2017  Medication Sig  . amLODipine (NORVASC) 5 MG tablet TAKE 1 TABLET EVERY DAY  . Cholecalciferol (VITAMIN D) 2000 units tablet Take 2,000 Units by mouth daily as needed.   . Glucosamine-Chondroit-Vit C-Mn (GLUCOSAMINE 1500 COMPLEX PO) Take 1 tablet by mouth 2 (two) times daily.  Marland Kitchen  levothyroxine (SYNTHROID, LEVOTHROID) 112 MCG tablet TAKE 1 TABLET EVERY DAY  . meloxicam (MOBIC) 15 MG tablet TAKE 1 TABLET (15 MG TOTAL) BY MOUTH DAILY. (Patient taking differently: Take 15 mg by mouth daily as needed. )  . Multiple Vitamin (MULTIVITAMIN) capsule Take 1 capsule by mouth daily.    . [DISCONTINUED] clarithromycin (BIAXIN) 500 MG tablet Take 1 tablet (500 mg total) by mouth 2 (two) times daily.   No facility-administered encounter medications on file as of 08/21/2017.     Activities of Daily Living No flowsheet data found.  Patient Care Team: [name], MD as PCP - General (Family Medicine) [name], CNM as Midwife (Certified Nurse Midwife)    Assessment:   This is a routine wellness examination for [place].  Exercise Activities and Dietary recommendations    Goals    . Exercise 150 min/wk Moderate Activity     Continue to stay strong and exercise and monitor nutrition       Fall Risk Fall Risk  03/27/2016 06/01/2015 03/01/2014  Falls in the past year? No No No     Depression Screen PHQ 2/9 Scores 03/27/2016 06/01/2015 03/01/2014 09/05/2012  PHQ - 2 Score 0 0 0 0     Cognitive Function MMSE - Mini Mental State Exam 03/27/2016  Not completed: (No Data)   Ad8 score reviewed for issues:  Issues making decisions:  Less interest in hobbies / activities:  Repeats questions, stories (family complaining):  Trouble using ordinary gadgets (microwave, computer, phone):  Forgets the month or year:   Mismanaging finances:   Remembering appts:  Daily problems with thinking and/or memory: Ad8 score is=0         Immunization History  Administered Date(s) Administered  . Influenza Whole 02/19/2005, 01/27/2007, 01/14/2008  . Influenza, High Dose Seasonal PF 04/01/2015, 01/10/2016, 02/06/2017  . Influenza,inj,Quad PF,6+ Mos 02/20/2013, 03/01/2014  . Pneumococcal Conjugate-13 06/01/2015  . Pneumococcal Polysaccharide-23 03/01/2014  . Td  02/14/2001  . Tdap 08/29/2010  . Zoster 12/15/2010     Screening Tests Health Maintenance  Topic Date Due  . Hepatitis C Screening  Jan 02, 1946  . INFLUENZA VACCINE  11/14/2017  . MAMMOGRAM  02/08/2018  . TETANUS/TDAP  08/28/2020  . COLONOSCOPY  04/29/2021  . DEXA SCAN  Completed  .  PNA vac Low Risk Adult  Completed        Plan:      PCP Notes  Health Maintenance Discussed repeating dexa 2020 after her GYN apt. Will review the osteoporosis site for more information. Currently osteopenic; no fx hx; no smoking hx  Educated regarding Hep c and states [employer] declined to pay last year, will check this year  Educated regarding shingrix   Abnormal Screens  None  Referrals  none  Patient concerns; none  Nurse Concerns; As noted   Next PCP apt Scheduled for June       I have personally reviewed and noted the following in the patient's chart:   . Medical and social history . Use of alcohol, tobacco or illicit drugs  . Current medications and supplements . Functional ability and status . Nutritional status . Physical activity . Advanced directives . List of other physicians . Hospitalizations, surgeries, and ER visits in previous 12 months . Vitals . Screenings to include cognitive, depression, and falls . Referrals and appointments  In addition, I have reviewed and discussed with patient certain preventive protocols, quality metrics, and best practice recommendations. A written personalized care plan for preventive services as well as general preventive health recommendations were provided to patient.     OINOM,[facility], RN  08/21/2017   I have read this note and agree with its contents.  [name], MD

## 2017-08-21 ENCOUNTER — Ambulatory Visit (INDEPENDENT_AMBULATORY_CARE_PROVIDER_SITE_OTHER): Payer: Medicare HMO

## 2017-08-21 ENCOUNTER — Encounter: Payer: Medicare HMO | Admitting: Family Medicine

## 2017-08-21 VITALS — BP 136/80 | HR 72 | Ht 65.0 in | Wt 173.0 lb

## 2017-08-21 DIAGNOSIS — Z0000 Encounter for general adult medical examination without abnormal findings: Secondary | ICD-10-CM | POA: Diagnosis not present

## 2017-08-21 NOTE — Patient Instructions (Addendum)
[name] , Thank you for taking time to come for your Medicare Wellness Visit. I appreciate your ongoing commitment to your health goals. Please review the following plan we discussed and let me know if I can assist you in the future.    Postpone hep c due to it not being covered last year  You had a zoster in 2012;  Shingrix is a vaccine for the prevention of Shingles in Adults 50 and older.  If you are on Medicare, the shingrix is covered under your Part D plan, so you will take both of the vaccines in the series at your pharmacy. Please check with your benefits regarding applicable copays or out of pocket expenses.  The Shingrix is given in 2 vaccines approx 8 weeks apart. You must receive the 2nd dose prior to 6 months from receipt of the first. Please have the pharmacist print out you Immunization  dates for our office records   To check with your GYN to see if they are following your Bone density ( DEXA) and mammogram Your osteopenia; your lowest T score was-1.8 05/02/2015    Lumbar spine (L1-L4) Femoral neck (FN)  T-score  -1.4  RFN: -1.8 LFN: -1.3    Please visit the osteoporosis [email or web address] as they give you a lot of information on all the meds     These are the goals we discussed: Goals    . Exercise 150 min/wk Moderate Activity     Continue to stay strong and exercise and monitor nutrition       This is a list of the screening recommended for you and due dates:  Health Maintenance  Topic Date Due  .  Hepatitis C: One time screening is recommended by Center for Disease Control  (CDC) for  adults born from [birth year] through 1963.   01-Oct-1945  . Flu Shot  11/14/2017  . Mammogram  02/08/2018  . Tetanus Vaccine  08/28/2020  . Colon Cancer Screening  04/29/2021  . DEXA scan (bone density measurement)  Completed  . Pneumonia vaccines  Completed     Bone Densitometry Bone densitometry is an imaging test that uses a special X-ray to measure the amount of calcium and  other minerals in your bones (bone density). This test is also known as a bone mineral density test or dual-energy X-ray absorptiometry (DXA). The test can measure bone density at your hip and your spine. It is similar to having a regular X-ray. You may have this test to:  Diagnose a condition that causes weak or thin bones (osteoporosis).  Predict your risk of a broken bone (fracture).  Determine how well osteoporosis treatment is working.  Tell a health care provider about:  Any allergies you have.  All medicines you are taking, including vitamins, herbs, eye drops, creams, and over-the-counter medicines.  Any problems you or family members have had with anesthetic medicines.  Any blood disorders you have.  Any surgeries you have had.  Any medical conditions you have.  Possibility of pregnancy.  Any other medical test you had within the previous 14 days that used contrast material. What are the risks? Generally, this is a safe procedure. However, problems can occur and may include the following:  This test exposes you to a very small amount of radiation.  The risks of radiation exposure may be greater to unborn children.  What happens before the procedure?  Do not take any calcium supplements for 24 hours before having the test. You can otherwise  eat and drink what you usually do.  Take off all metal jewelry, eyeglasses, dental appliances, and any other metal objects. What happens during the procedure?  You may lie on an exam table. There will be an X-ray generator below you and an imaging device above you.  Other devices, such as boxes or braces, may be used to position your body properly for the scan.  You will need to lie still while the machine slowly scans your body.  The images will show up on a computer monitor. What happens after the procedure? You may need more testing at a later time. This information is not intended to replace advice given to you by your  health care provider. Make sure you discuss any questions you have with your health care provider. Document Released: 04/24/2004 Document Revised: 09/08/2015 Document Reviewed: 09/10/2013 Elsevier Interactive Patient Education  2018 [place] in the Home Falls can cause injuries. They can happen to people of all ages. There are many things you can do to make your home safe and to help prevent falls. What can I do on the outside of my home?  Regularly fix the edges of walkways and driveways and fix any cracks.  Remove anything that might make you trip as you walk through a door, such as a raised step or threshold.  Trim any bushes or trees on the path to your home.  Use bright outdoor lighting.  Clear any walking paths of anything that might make someone trip, such as rocks or tools.  Regularly check to see if handrails are loose or broken. Make sure that both sides of any steps have handrails.  Any raised decks and porches should have guardrails on the edges.  Have any leaves, snow, or ice cleared regularly.  Use sand or salt on walking paths during winter.  Clean up any spills in your garage right away. This includes oil or grease spills. What can I do in the bathroom?  Use night lights.  Install grab bars by the toilet and in the tub and shower. Do not use towel bars as grab bars.  Use non-skid mats or decals in the tub or shower.  If you need to sit down in the shower, use a plastic, non-slip stool.  Keep the floor dry. Clean up any water that spills on the floor as soon as it happens.  Remove soap buildup in the tub or shower regularly.  Attach bath mats securely with double-sided non-slip rug tape.  Do not have throw rugs and other things on the floor that can make you trip. What can I do in the bedroom?  Use night lights.  Make sure that you have a light by your bed that is easy to reach.  Do not use any sheets or blankets that are too  big for your bed. They should not hang down onto the floor.  Have a firm chair that has side arms. You can use this for support while you get dressed.  Do not have throw rugs and other things on the floor that can make you trip. What can I do in the kitchen?  Clean up any spills right away.  Avoid walking on wet floors.  Keep items that you use a lot in easy-to-reach places.  If you need to reach something above you, use a strong step stool that has a grab bar.  Keep electrical cords out of the way.  Do not use floor polish or  wax that makes floors slippery. If you must use wax, use non-skid floor wax.  Do not have throw rugs and other things on the floor that can make you trip. What can I do with my stairs?  Do not leave any items on the stairs.  Make sure that there are handrails on both sides of the stairs and use them. Fix handrails that are broken or loose. Make sure that handrails are as long as the stairways.  Check any carpeting to make sure that it is firmly attached to the stairs. Fix any carpet that is loose or worn.  Avoid having throw rugs at the top or bottom of the stairs. If you do have throw rugs, attach them to the floor with carpet tape.  Make sure that you have a light switch at the top of the stairs and the bottom of the stairs. If you do not have them, ask someone to add them for you. What else can I do to help prevent falls?  Wear shoes that: ? Do not have high heels. ? Have rubber bottoms. ? Are comfortable and fit you well. ? Are closed at the toe. Do not wear sandals.  If you use a stepladder: ? Make sure that it is fully opened. Do not climb a closed stepladder. ? Make sure that both sides of the stepladder are locked into place. ? Ask someone to hold it for you, if possible.  Clearly mark and make sure that you can see: ? Any grab bars or handrails. ? First and last steps. ? Where the edge of each step is.  Use tools that help you move  around (mobility aids) if they are needed. These include: ? Canes. ? Walkers. ? Scooters. ? Crutches.  Turn on the lights when you go into a dark area. Replace any light bulbs as soon as they burn out.  Set up your furniture so you have a clear path. Avoid moving your furniture around.  If any of your floors are uneven, fix them.  If there are any pets around you, be aware of where they are.  Review your medicines with your doctor. Some medicines can make you feel dizzy. This can increase your chance of falling. Ask your doctor what other things that you can do to help prevent falls. This information is not intended to replace advice given to you by your health care provider. Make sure you discuss any questions you have with your health care provider. Document Released: 01/27/2009 Document Revised: 09/08/2015 Document Reviewed: 05/07/2014 Elsevier Interactive Patient Education  2018 [place] Maintenance, Female Adopting a healthy lifestyle and getting preventive care can go a long way to promote health and wellness. Talk with your health care provider about what schedule of regular examinations is right for you. This is a good chance for you to check in with your provider about disease prevention and staying healthy. In between checkups, there are plenty of things you can do on your own. Experts have done a lot of research about which lifestyle changes and preventive measures are most likely to keep you healthy. Ask your health care provider for more information. Weight and diet Eat a healthy diet  Be sure to include plenty of vegetables, fruits, low-fat dairy products, and lean protein.  Do not eat a lot of foods high in solid fats, added sugars, or salt.  Get regular exercise. This is one of the most important things you can do for your health. ?  Most adults should exercise for at least 150 minutes each week. The exercise should increase your heart rate and make you  sweat (moderate-intensity exercise). ? Most adults should also do strengthening exercises at least twice a week. This is in addition to the moderate-intensity exercise.  Maintain a healthy weight  Body mass index (BMI) is a measurement that can be used to identify possible weight problems. It estimates body fat based on height and weight. Your health care provider can help determine your BMI and help you achieve or maintain a healthy weight.  For females 36 years of age and older: ? A BMI below 18.5 is considered underweight. ? A BMI of 18.5 to 24.9 is normal. ? A BMI of 25 to 29.9 is considered overweight. ? A BMI of 30 and above is considered obese.  Watch levels of cholesterol and blood lipids  You should start having your blood tested for lipids and cholesterol at 70 years of age, then have this test every 5 years.  You may need to have your cholesterol levels checked more often if: ? Your lipid or cholesterol levels are high. ? You are older than 70 years of age. ? You are at high risk for heart disease.  Cancer screening Lung Cancer  Lung cancer screening is recommended for adults 77-17 years old who are at high risk for lung cancer because of a history of smoking.  A yearly low-dose CT scan of the lungs is recommended for people who: ? Currently smoke. ? Have quit within the past 15 years. ? Have at least a 30-pack-year history of smoking. A pack year is smoking an average of one pack of cigarettes a day for 1 year.  Yearly screening should continue until it has been 15 years since you quit.  Yearly screening should stop if you develop a health problem that would prevent you from having lung cancer treatment.  Breast Cancer  Practice breast self-awareness. This means understanding how your breasts normally appear and feel.  It also means doing regular breast self-exams. Let your health care provider know about any changes, no matter how small.  If you are in your 20s  or 30s, you should have a clinical breast exam (CBE) by a health care provider every 1-3 years as part of a regular health exam.  If you are 55 or older, have a CBE every year. Also consider having a breast X-ray (mammogram) every year.  If you have a family history of breast cancer, talk to your health care provider about genetic screening.  If you are at high risk for breast cancer, talk to your health care provider about having an MRI and a mammogram every year.  Breast cancer gene (BRCA) assessment is recommended for women who have family members with BRCA-related cancers. BRCA-related cancers include: ? Breast. ? Ovarian. ? Tubal. ? Peritoneal cancers.  Results of the assessment will determine the need for genetic counseling and BRCA1 and BRCA2 testing.  Cervical Cancer Your health care provider may recommend that you be screened regularly for cancer of the pelvic organs (ovaries, uterus, and vagina). This screening involves a pelvic examination, including checking for microscopic changes to the surface of your cervix (Pap test). You may be encouraged to have this screening done every 3 years, beginning at age 61.  For women ages 58-65, health care providers may recommend pelvic exams and Pap testing every 3 years, or they may recommend the Pap and pelvic exam, combined with testing for human  papilloma virus (HPV), every 5 years. Some types of HPV increase your risk of cervical cancer. Testing for HPV may also be done on women of any age with unclear Pap test results.  Other health care providers may not recommend any screening for nonpregnant women who are considered low risk for pelvic cancer and who do not have symptoms. Ask your health care provider if a screening pelvic exam is right for you.  If you have had past treatment for cervical cancer or a condition that could lead to cancer, you need Pap tests and screening for cancer for at least 20 years after your treatment. If Pap tests  have been discontinued, your risk factors (such as having a new sexual partner) need to be reassessed to determine if screening should resume. Some women have medical problems that increase the chance of getting cervical cancer. In these cases, your health care provider may recommend more frequent screening and Pap tests.  Colorectal Cancer  This type of cancer can be detected and often prevented.  Routine colorectal cancer screening usually begins at 70 years of age and continues through 70 years of age.  Your health care provider may recommend screening at an earlier age if you have risk factors for colon cancer.  Your health care provider may also recommend using home test kits to check for hidden blood in the stool.  A small camera at the end of a tube can be used to examine your colon directly (sigmoidoscopy or colonoscopy). This is done to check for the earliest forms of colorectal cancer.  Routine screening usually begins at age 79.  Direct examination of the colon should be repeated every 5-10 years through 70 years of age. However, you may need to be screened more often if early forms of precancerous polyps or small growths are found.  Skin Cancer  Check your skin from head to toe regularly.  Tell your health care provider about any new moles or changes in moles, especially if there is a change in a mole's shape or color.  Also tell your health care provider if you have a mole that is larger than the size of a pencil eraser.  Always use sunscreen. Apply sunscreen liberally and repeatedly throughout the day.  Protect yourself by wearing long sleeves, pants, a wide-brimmed hat, and sunglasses whenever you are outside.  Heart disease, diabetes, and high blood pressure  High blood pressure causes heart disease and increases the risk of stroke. High blood pressure is more likely to develop in: ? People who have blood pressure in the high end of the normal range (130-139/85-89 mm  Hg). ? People who are overweight or obese. ? People who are African American.  If you are 68-45 years of age, have your blood pressure checked every 3-5 years. If you are 57 years of age or older, have your blood pressure checked every year. You should have your blood pressure measured twice-once when you are at a hospital or clinic, and once when you are not at a hospital or clinic. Record the average of the two measurements. To check your blood pressure when you are not at a hospital or clinic, you can use: ? An automated blood pressure machine at a pharmacy. ? A home blood pressure monitor.  If you are between 54 years and 65 years old, ask your health care provider if you should take aspirin to prevent strokes.  Have regular diabetes screenings. This involves taking a blood sample to check your  fasting blood sugar level. ? If you are at a normal weight and have a low risk for diabetes, have this test once every three years after 69 years of age. ? If you are overweight and have a high risk for diabetes, consider being tested at a younger age or more often. Preventing infection Hepatitis B  If you have a higher risk for hepatitis B, you should be screened for this virus. You are considered at high risk for hepatitis B if: ? You were born in a country where hepatitis B is common. Ask your health care provider which countries are considered high risk. ? Your parents were born in a high-risk country, and you have not been immunized against hepatitis B (hepatitis B vaccine). ? You have HIV or AIDS. ? You use needles to inject street drugs. ? You live with someone who has hepatitis B. ? You have had sex with someone who has hepatitis B. ? You get hemodialysis treatment. ? You take certain medicines for conditions, including cancer, organ transplantation, and autoimmune conditions.  Hepatitis C  Blood testing is recommended for: ? Everyone born from [birth year] through 1963. ? Anyone with known  risk factors for hepatitis C.  Sexually transmitted infections (STIs)  You should be screened for sexually transmitted infections (STIs) including gonorrhea and chlamydia if: ? You are sexually active and are younger than 70 years of age. ? You are older than 70 years of age and your health care provider tells you that you are at risk for this type of infection. ? Your sexual activity has changed since you were last screened and you are at an increased risk for chlamydia or gonorrhea. Ask your health care provider if you are at risk.  If you do not have HIV, but are at risk, it may be recommended that you take a prescription medicine daily to prevent HIV infection. This is called pre-exposure prophylaxis (PrEP). You are considered at risk if: ? You are sexually active and do not regularly use condoms or know the HIV status of your partner(s). ? You take drugs by injection. ? You are sexually active with a partner who has HIV.  Talk with your health care provider about whether you are at high risk of being infected with HIV. If you choose to begin PrEP, you should first be tested for HIV. You should then be tested every 3 months for as long as you are taking PrEP. Pregnancy  If you are premenopausal and you may become pregnant, ask your health care provider about preconception counseling.  If you may become pregnant, take 400 to 800 micrograms (mcg) of folic acid every day.  If you want to prevent pregnancy, talk to your health care provider about birth control (contraception). Osteoporosis and menopause  Osteoporosis is a disease in which the bones lose minerals and strength with aging. This can result in serious bone fractures. Your risk for osteoporosis can be identified using a bone density scan.  If you are 67 years of age or older, or if you are at risk for osteoporosis and fractures, ask your health care provider if you should be screened.  Ask your health care provider whether you  should take a calcium or vitamin D supplement to lower your risk for osteoporosis.  Menopause may have certain physical symptoms and risks.  Hormone replacement therapy may reduce some of these symptoms and risks. Talk to your health care provider about whether hormone replacement therapy is right for you. Follow  these instructions at home:  Schedule regular health, dental, and eye exams.  Stay current with your immunizations.  Do not use any tobacco products including cigarettes, chewing tobacco, or electronic cigarettes.  If you are pregnant, do not drink alcohol.  If you are breastfeeding, limit how much and how often you drink alcohol.  Limit alcohol intake to no more than 1 drink per day for nonpregnant women. One drink equals 12 ounces of beer, 5 ounces of wine, or 1 ounces of hard liquor.  Do not use street drugs.  Do not share needles.  Ask your health care provider for help if you need support or information about quitting drugs.  Tell your health care provider if you often feel depressed.  Tell your health care provider if you have ever been abused or do not feel safe at home. This information is not intended to replace advice given to you by your health care provider. Make sure you discuss any questions you have with your health care provider. Document Released: 10/16/2010 Document Revised: 09/08/2015 Document Reviewed: 01/04/2015 Elsevier Interactive Patient Education  [employer].

## 2017-09-07 ENCOUNTER — Encounter: Payer: Self-pay | Admitting: Emergency Medicine

## 2017-09-07 ENCOUNTER — Emergency Department: Payer: Medicare HMO

## 2017-09-07 ENCOUNTER — Emergency Department
Admission: EM | Admit: 2017-09-07 | Discharge: 2017-09-07 | Disposition: A | Discharge status: Home / Self Care | Payer: Medicare HMO | Attending: Emergency Medicine | Admitting: Emergency Medicine

## 2017-09-07 ENCOUNTER — Other Ambulatory Visit: Payer: Self-pay

## 2017-09-07 ENCOUNTER — Other Ambulatory Visit: Payer: Self-pay | Admitting: General Surgery

## 2017-09-07 DIAGNOSIS — I10 Essential (primary) hypertension: Secondary | ICD-10-CM | POA: Insufficient documentation

## 2017-09-07 DIAGNOSIS — W010XXA Fall on same level from slipping, tripping and stumbling without subsequent striking against object, initial encounter: Secondary | ICD-10-CM | POA: Insufficient documentation

## 2017-09-07 DIAGNOSIS — Z79899 Other long term (current) drug therapy: Secondary | ICD-10-CM | POA: Insufficient documentation

## 2017-09-07 DIAGNOSIS — M542 Cervicalgia: Secondary | ICD-10-CM | POA: Diagnosis not present

## 2017-09-07 DIAGNOSIS — Y998 Other external cause status: Secondary | ICD-10-CM | POA: Insufficient documentation

## 2017-09-07 DIAGNOSIS — S199XXA Unspecified injury of neck, initial encounter: Secondary | ICD-10-CM | POA: Diagnosis not present

## 2017-09-07 DIAGNOSIS — Y939 Activity, unspecified: Secondary | ICD-10-CM | POA: Diagnosis not present

## 2017-09-07 DIAGNOSIS — W19XXXA Unspecified fall, initial encounter: Secondary | ICD-10-CM

## 2017-09-07 DIAGNOSIS — Y92511 Restaurant or cafe as the place of occurrence of the external cause: Secondary | ICD-10-CM | POA: Diagnosis not present

## 2017-09-07 MED ORDER — CYCLOBENZAPRINE HCL 5 MG PO TABS: ORAL_TABLET | ORAL | 0 refills | Status: DC

## 2017-09-07 MED ORDER — MELOXICAM 15 MG PO TABS: 15.0000 mg | ORAL_TABLET | Freq: Every day | ORAL | 0 refills | Status: AC

## 2017-09-07 MED ORDER — LIDOCAINE 5 % EX PTCH: 1.0000 | MEDICATED_PATCH | Freq: Two times a day (BID) | CUTANEOUS | 0 refills | Status: DC

## 2017-09-07 NOTE — ED Provider Notes (Signed)
[facility] Emergency Department Provider Note  ____________________________________________  Time seen: Approximately 9:53 AM  I have reviewed the triage vital signs and the nursing notes.   HISTORY  Chief Complaint Fall    HPI Meghan Dixon is a 72 y.o. female that presents to the emergency department for evaluation of neck pain after a fall yesterday.  Patient states that she was at [facility] and she slipped on wet floor.  She landed on her left side. Last night the left side of her back was hurting.  This morning more of the right side of her back is hurting.  She feels like her muscles are spasming.  She had a minor headache yesterday that is better today. She did not hit her head or lose consciousness.  She does not take any blood thinners.  She has been walking normally.  No additional injuries.  Patient tried to go to urgent care this morning but they told her to come here.    Past Medical History:  Diagnosis Date  . Allergy    allergic rhinitis  . Arthritis    foot  . HSV infection   . Hypertension   . Hyperthyroidism   . Osteopenia     Patient Active Problem List   Diagnosis Date Noted  . Urinary, incontinence, stress female 01/30/2013  . Urinary frequency 01/30/2013  . Vitamin D deficiency 09/03/2011  . Hypothyroid 08/29/2010  . BACK PAIN, LUMBAR 08/29/2009  . SCIATICA, LEFT 08/29/2009  . HYPERCHOLESTEROLEMIA 08/22/2009  . BENIGN POSITIONAL VERTIGO 03/23/2009  . Essential hypertension 05/27/2007  . 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 TENSION HEADACHE 10/21/2006  . Migraine headache 10/21/2006  . SYNDROME, CARPAL TUNNEL 10/21/2006  . FIBROCYSTIC BREAST DISEASE 10/21/2006  . Osteopenia 10/21/2006    Past Surgical History:  Procedure Laterality Date  . ABDOMINAL HYSTERECTOMY     for bleeding and ovarian cyst  . APPENDECTOMY    . CARPAL TUNNEL RELEASE     left  . COLONOSCOPY  04-30-11   per Dr. Ardis Hughs, no polyps, repeat in 10 yrs   .  ESOPHAGOGASTRODUODENOSCOPY  04-30-11   per Dr. Ardis Hughs, gastritis, H pylori neg   . FOOT SURGERY     bilateral  . TONSILLECTOMY      Prior to Admission medications   Medication Sig Start Date End Date Taking? Authorizing Provider  amLODipine (NORVASC) 5 MG tablet TAKE 1 TABLET EVERY DAY 05/31/17   [name], MD  Cholecalciferol (VITAMIN D) 2000 units tablet Take 2,000 Units by mouth daily as needed.     [provider]  cyclobenzaprine (FLEXERIL) 5 MG tablet Take 1/2 tablet - 1 tablet up to three times daily as needed for muscle spasms. 09/07/17   Laban Emperor, PA-C  Glucosamine-Chondroit-Vit C-Mn (GLUCOSAMINE 1500 COMPLEX PO) Take 1 tablet by mouth 2 (two) times daily.    [provider]  levothyroxine (SYNTHROID, LEVOTHROID) 112 MCG tablet TAKE 1 TABLET EVERY DAY 05/31/17   [name], MD  lidocaine (LIDODERM) 5 % Place 1 patch onto the skin every 12 (twelve) hours. Remove & Discard patch within 12 hours or as directed by MD 09/07/17 09/07/18  Laban Emperor, PA-C  meloxicam (MOBIC) 15 MG tablet Take 1 tablet (15 mg total) by mouth daily for 10 days. 09/07/17 09/17/17  Laban Emperor, PA-C  Multiple Vitamin (MULTIVITAMIN) capsule Take 1 capsule by mouth daily.      [provider]    Allergies Augmentin [amoxicillin-pot clavulanate]; Beta adrenergic blockers; Cefuroxime  axetil; Cetirizine hcl; Fexofenadine; and Prednisone  Family History  Problem Relation Age of Onset  . Heart disease Mother   . Cancer Father        esophageal CA  . Alcohol abuse Father   . Arthritis Sister        RA  . Heart disease Brother        CAD  . Colon cancer Brother 25  . Lung cancer Brother   . Stomach cancer Neg Hx     Social History Social History   Tobacco Use  . Smoking status: Never Smoker  . Smokeless tobacco: Never Used  Substance Use Topics  . Alcohol use: No    Alcohol/week: 0.0 oz  . Drug use: No     Review of Systems  Cardiovascular: No chest  pain. Respiratory: No SOB. Gastrointestinal: No abdominal pain.  No nausea, no vomiting.  Musculoskeletal: Positive for neck and back pain. Skin: Negative for rash, abrasions, lacerations, ecchymosis. Neurological: Negative for numbness or tingling   ____________________________________________   PHYSICAL EXAM:  VITAL SIGNS: ED Triage Vitals  Enc Vitals Group     BP 09/07/17 0913 131/75     Pulse Rate 09/07/17 0913 73     Resp 09/07/17 0913 20     Temp 09/07/17 0913 98.6 F (37 C)     Temp Source 09/07/17 0913 Oral     SpO2 09/07/17 0913 98 %     Weight 09/07/17 0914 170 lb (77.1 kg)     Height 09/07/17 0914 5\' 5"  (1.651 m)     Head Circumference --      Peak Flow --      Pain Score 09/07/17 0914 4     Pain Loc --      Pain Edu? --      Excl. in [place]? --      Constitutional: Alert and oriented. Well appearing and in no acute distress. Eyes: Conjunctivae are normal. PERRL. EOMI. Head: Atraumatic. ENT:      Ears:      Nose: No congestion/rhinnorhea.      Mouth/Throat: Mucous membranes are moist.  Neck: No stridor. Mild tenderness to palpation over lumbar spine and paraspinal muscles.  Full range of motion of neck.  No pinpoint tenderness to palpation. Cardiovascular: Normal rate, regular rhythm.  Good peripheral circulation. Respiratory: Normal respiratory effort without tachypnea or retractions. Lungs CTAB. Good air entry to the bases with no decreased or absent breath sounds. Gastrointestinal: Bowel sounds 4 quadrants. Soft and nontender to palpation. No guarding or rigidity. No palpable masses. No distention.   Musculoskeletal: Full range of motion to all extremities. No gross deformities appreciated.  Mild tenderness to palpation throughout upper back.  No pinpoint tenderness to palpation. Neurologic:  Normal speech and language. No gross focal neurologic deficits are appreciated.  Skin:  Skin is warm, dry and intact. No rash noted. Psychiatric: Mood and affect are  normal. Speech and behavior are normal. Patient exhibits appropriate insight and judgement.   ____________________________________________   LABS (all labs ordered are listed, but only abnormal results are displayed)  Labs Reviewed - No data to display ____________________________________________  EKG   ____________________________________________  RADIOLOGY Robinette Haines, personally viewed and evaluated these images (plain radiographs) as part of my medical decision making, as well as reviewing the written report by the radiologist.  Dg Cervical Spine Complete  Result Date: 09/07/2017 CLINICAL DATA:  Tightness and stiffness in the neck and both shoulders following a fall yesterday. EXAM:  CERVICAL SPINE - COMPLETE 4+ VIEW COMPARISON:  None. FINDINGS: Moderate to marked disc space narrowing mild anterior posterior spur formation and mild retrolisthesis at the C3-4 level. Minimal anterolisthesis at the C5-6 level. Facet degenerative changes throughout the cervical spine. Moderate anterior spur formation with disc space narrowing at the C6-7 level and mild anterior spur formation at the C5-6 level. The neural foramina on the right are difficult to assess due to inadequate obliquity. There is mild foraminal stenosis on the left at the C3-4 and C4-5 levels. No fractures are seen. Mild bilateral carotid artery calcifications are noted. IMPRESSION: 1. Multilevel degenerative changes with associated mild subluxations, as described above. 2. No cervical spine fracture. 3. Mild bilateral carotid artery atheromatous calcifications. Electronically Signed   By: Claudie Revering M.D.   On: 09/07/2017 11:04    ____________________________________________    PROCEDURES  Procedure(s) performed:    Procedures    Medications - No data to display   ____________________________________________   INITIAL IMPRESSION / ASSESSMENT AND PLAN / ED COURSE  Pertinent labs & imaging results that were  available during my care of the patient were reviewed by me and considered in my medical decision making (see chart for details).  Review of the  CSRS was performed in accordance of the [place] prior to dispensing any controlled drugs.   Patient presented to the emergency department for evaluation of neck and back pain after fall yesterday.  Vital signs and exam are reassuring.  Patient appears comfortable.  She is reading. No acute changes on x-ray.  All x-ray findings were discussed with patient and she will follow-up with PCP regarding these.  Patient will be discharged home with prescriptions for flexeril, lidoderm, mobic. Patient is to follow up with flexeril, lidoderm as directed. Patient is given ED precautions to return to the ED for any worsening or new symptoms.     ____________________________________________  FINAL CLINICAL IMPRESSION(S) / ED DIAGNOSES  Final diagnoses:  Fall, initial encounter  Neck pain      NEW MEDICATIONS STARTED DURING THIS VISIT:  ED Discharge Orders        Ordered    cyclobenzaprine (FLEXERIL) 5 MG tablet     09/07/17 1118    lidocaine (LIDODERM) 5 %  Every 12 hours     09/07/17 1118    meloxicam (MOBIC) 15 MG tablet  Daily     09/07/17 1118          This chart was dictated using voice recognition software/Dragon. Despite best efforts to proofread, errors can occur which can change the meaning. Any change was purely unintentional.    Laban Emperor, PA-C 09/07/17 1616    Nena Polio, MD 09/08/17 1539

## 2017-09-07 NOTE — ED Triage Notes (Signed)
Slipped and fell yesterday at [facility]. No LOC. Multiple painful areas today. Alert, oriented, ambulated in.

## 2017-09-07 NOTE — ED Notes (Signed)
Pt was at [facility] and slipped on a mopped floor yesterday - pt fell on left side but today has started having right sided pain in the neck/shoulder/and entire back

## 2017-09-10 ENCOUNTER — Ambulatory Visit (INDEPENDENT_AMBULATORY_CARE_PROVIDER_SITE_OTHER): Payer: Medicare HMO | Admitting: Family Medicine

## 2017-09-10 ENCOUNTER — Encounter: Payer: Self-pay | Admitting: Family Medicine

## 2017-09-10 VITALS — BP 130/76 | HR 75 | Temp 98.3°F | Ht 65.0 in | Wt 173.6 lb

## 2017-09-10 DIAGNOSIS — S29019D Strain of muscle and tendon of unspecified wall of thorax, subsequent encounter: Secondary | ICD-10-CM

## 2017-09-10 DIAGNOSIS — S161XXD Strain of muscle, fascia and tendon at neck level, subsequent encounter: Secondary | ICD-10-CM | POA: Diagnosis not present

## 2017-09-10 MED ORDER — METHOCARBAMOL 500 MG PO TABS: 500.0000 mg | ORAL_TABLET | Freq: Four times a day (QID) | ORAL | 2 refills | Status: DC | PRN

## 2017-09-10 NOTE — Progress Notes (Signed)
   Subjective:    Patient ID: Meghan Dixon, female    DOB: 1945-05-04, 72 y.o.   MRN: [id number]  HPI Here to follow up an ER visit on 09-07-17 for injuries from a fall that occurred on 09-06-17. While at a fast food restaurant she slipped on a wet area of the floor and fell, landing on her left side. No head trauma. No LOC. At the ER she had cervical spine Xrays which showed degenerative changes and some spurring, but no acute abnormalities. She was started on Meloxicam and Flexeril. She stopped the Flexeril after 2 or 3 doses because it was too sedating. Today she feels stiffness and mild pain in the lower back, the upper back , and the neck. She has a mild occipital headache. No neurologic deficits.    Review of Systems  Constitutional: Negative.   HENT: Negative.   Eyes: Negative.   Respiratory: Negative.   Cardiovascular: Negative.   Musculoskeletal: Positive for back pain, neck pain and neck stiffness.  Neurological: Negative.        Objective:   Physical Exam  Constitutional: She is oriented to person, place, and time. She appears well-developed and well-nourished. No distress.  Eyes: Pupils are equal, round, and reactive to light. EOM are normal.  Cardiovascular: Normal rate, regular rhythm, normal heart sounds and intact distal pulses.  Pulmonary/Chest: Effort normal and breath sounds normal.  Musculoskeletal:  Her neck and spine show full ROM. She is mildly tender in the posterior neck and the left upper thoracic region   Neurological: She is alert and oriented to person, place, and time. She exhibits normal muscle tone. Coordination normal.          Assessment & Plan:  Neck and upper back strains. Rest, apply heat. Try Robaxin for spasms. Continue Meloxicam. Recheck as needed.  [name], MD

## 2017-09-23 ENCOUNTER — Encounter: Payer: Medicare HMO | Admitting: Family Medicine

## 2017-10-07 ENCOUNTER — Encounter: Payer: Self-pay | Admitting: Family Medicine

## 2017-10-07 ENCOUNTER — Ambulatory Visit (INDEPENDENT_AMBULATORY_CARE_PROVIDER_SITE_OTHER): Payer: Medicare HMO | Admitting: Family Medicine

## 2017-10-07 VITALS — BP 124/80 | HR 68 | Temp 98.3°F | Ht 64.0 in | Wt 174.0 lb

## 2017-10-07 DIAGNOSIS — I6523 Occlusion and stenosis of bilateral carotid arteries: Secondary | ICD-10-CM | POA: Diagnosis not present

## 2017-10-07 DIAGNOSIS — E559 Vitamin D deficiency, unspecified: Secondary | ICD-10-CM

## 2017-10-07 DIAGNOSIS — M79671 Pain in right foot: Secondary | ICD-10-CM

## 2017-10-07 DIAGNOSIS — I10 Essential (primary) hypertension: Secondary | ICD-10-CM

## 2017-10-07 DIAGNOSIS — E039 Hypothyroidism, unspecified: Secondary | ICD-10-CM

## 2017-10-07 DIAGNOSIS — Z8619 Personal history of other infectious and parasitic diseases: Secondary | ICD-10-CM

## 2017-10-07 DIAGNOSIS — M545 Low back pain: Secondary | ICD-10-CM

## 2017-10-07 DIAGNOSIS — E7800 Pure hypercholesterolemia, unspecified: Secondary | ICD-10-CM

## 2017-10-07 DIAGNOSIS — G8929 Other chronic pain: Secondary | ICD-10-CM | POA: Diagnosis not present

## 2017-10-07 DIAGNOSIS — R05 Cough: Secondary | ICD-10-CM

## 2017-10-07 DIAGNOSIS — R059 Cough, unspecified: Secondary | ICD-10-CM

## 2017-10-07 LAB — T4, FREE: Free T4: 1.03 ng/dL (ref 0.60–1.60)

## 2017-10-07 LAB — CBC WITH DIFFERENTIAL/PLATELET
Basophils Absolute: 0 10*3/uL (ref 0.0–0.1)
Basophils Relative: 0.8 % (ref 0.0–3.0)
EOS ABS: 0.2 10*3/uL (ref 0.0–0.7)
EOS PCT: 3 % (ref 0.0–5.0)
HCT: 40.4 % (ref 36.0–46.0)
HEMOGLOBIN: 13.8 g/dL (ref 12.0–15.0)
Lymphocytes Relative: 26.2 % (ref 12.0–46.0)
Lymphs Abs: 1.4 10*3/uL (ref 0.7–4.0)
MCHC: 34.1 g/dL (ref 30.0–36.0)
MCV: 91.8 fl (ref 78.0–100.0)
MONO ABS: 0.5 10*3/uL (ref 0.1–1.0)
Monocytes Relative: 8.7 % (ref 3.0–12.0)
NEUTROS PCT: 61.3 % (ref 43.0–77.0)
Neutro Abs: 3.3 10*3/uL (ref 1.4–7.7)
Platelets: 272 10*3/uL (ref 150.0–400.0)
RBC: 4.41 Mil/uL (ref 3.87–5.11)
RDW: 13.6 % (ref 11.5–15.5)
WBC: 5.4 10*3/uL (ref 4.0–10.5)

## 2017-10-07 LAB — BASIC METABOLIC PANEL
BUN: 12 mg/dL (ref 6–23)
CALCIUM: 9.6 mg/dL (ref 8.4–10.5)
CHLORIDE: 105 meq/L (ref 96–112)
CO2: 30 meq/L (ref 19–32)
CREATININE: 0.65 mg/dL (ref 0.40–1.20)
GFR: 95.83 mL/min (ref >60.00)
GLUCOSE: 105 mg/dL — AB (ref 70–99)
Potassium: 4.5 mEq/L (ref 3.5–5.1)
Sodium: 142 mEq/L (ref 135–145)

## 2017-10-07 LAB — LIPID PANEL
Cholesterol: 194 mg/dL (ref 0–200)
HDL: 69.8 mg/dL (ref >39.00)
LDL Cholesterol: 109 mg/dL — ABNORMAL HIGH (ref 0–99)
NONHDL: 123.88
TRIGLYCERIDES: 73 mg/dL (ref 0.0–149.0)
Total CHOL/HDL Ratio: 3
VLDL: 14.6 mg/dL (ref 0.0–40.0)

## 2017-10-07 LAB — POC URINALSYSI DIPSTICK (AUTOMATED)
Bilirubin, UA: NEGATIVE
Glucose, UA: NEGATIVE
KETONES UA: NEGATIVE
Leukocytes, UA: NEGATIVE
Nitrite, UA: NEGATIVE
PH UA: 7.5 (ref 5.0–8.0)
PROTEIN UA: NEGATIVE
RBC UA: NEGATIVE
SPEC GRAV UA: 1.015 (ref 1.010–1.025)
UROBILINOGEN UA: 0.2 U/dL

## 2017-10-07 LAB — VITAMIN D 25 HYDROXY (VIT D DEFICIENCY, FRACTURES): VITD: 39.69 ng/mL (ref 30.00–100.00)

## 2017-10-07 LAB — HEPATIC FUNCTION PANEL
ALBUMIN: 4.4 g/dL (ref 3.5–5.2)
ALT: 22 U/L (ref 0–35)
AST: 22 U/L (ref 0–37)
Alkaline Phosphatase: 63 U/L (ref 39–117)
BILIRUBIN DIRECT: 0.1 mg/dL (ref 0.0–0.3)
TOTAL PROTEIN: 7.2 g/dL (ref 6.0–8.3)
Total Bilirubin: 0.5 mg/dL (ref 0.2–1.2)

## 2017-10-07 LAB — T3, FREE: T3 FREE: 2.9 pg/mL (ref 2.3–4.2)

## 2017-10-07 LAB — TSH: TSH: 3.41 u[IU]/mL (ref 0.35–4.50)

## 2017-10-07 MED ORDER — MELOXICAM 15 MG PO TABS: 15.0000 mg | ORAL_TABLET | Freq: Every day | ORAL | 3 refills | Status: DC

## 2017-10-07 MED ORDER — AMLODIPINE BESYLATE 5 MG PO TABS: 5.0000 mg | ORAL_TABLET | Freq: Every day | ORAL | 3 refills | Status: DC

## 2017-10-07 NOTE — Progress Notes (Signed)
   Subjective:    Patient ID: Meghan Dixon, female    DOB: 03-Nov-1945, 72 y.o.   MRN: [id number]  HPI Here for a number of issues. She had some calcifications visible in both carotid arteries on a recent neck Xray and we need to investigate this further. Also she has had pain on the top of her right foot for the past year. She usually wears athletic shoes. Third she has had one month of PND and a dry cough. No ST or GERD symptoms. Fourth she attempted to donate blood recently and was turned down because she "tested positive for hepatitis B". She is not certain whether this was for the virus or for antibodies. She has never been ill with hepatitis to her knowledge.    Review of Systems  Constitutional: Negative.   HENT: Negative.   Eyes: Negative.   Respiratory: Positive for cough. Negative for choking, shortness of breath and wheezing.   Cardiovascular: Negative.   Gastrointestinal: Negative.   Genitourinary: Negative for decreased urine volume, difficulty urinating, dyspareunia, dysuria, enuresis, flank pain, frequency, hematuria, pelvic pain and urgency.  Musculoskeletal: Positive for arthralgias.  Skin: Negative.   Neurological: Negative.   Psychiatric/Behavioral: Negative.        Objective:   Physical Exam  Constitutional: She is oriented to person, place, and time. She appears well-developed and well-nourished. No distress.  HENT:  Head: Normocephalic and atraumatic.  Right Ear: External ear normal.  Left Ear: External ear normal.  Nose: Nose normal.  Mouth/Throat: Oropharynx is clear and moist. No oropharyngeal exudate.  Eyes: Pupils are equal, round, and reactive to light. Conjunctivae and EOM are normal. No scleral icterus.  Neck: Normal range of motion. Neck supple. No JVD present. No thyromegaly present.  Cardiovascular: Normal rate, regular rhythm, normal heart sounds and intact distal pulses. Exam reveals no gallop and no friction rub.  No murmur  heard. Pulmonary/Chest: Effort normal and breath sounds normal. No respiratory distress. She has no wheezes. She has no rales. She exhibits no tenderness.  Abdominal: Soft. Bowel sounds are normal. She exhibits no distension and no mass. There is no tenderness. There is no rebound and no guarding.  Musculoskeletal: Normal range of motion. She exhibits no edema.  There is a mobile tender firm nodule on the dorsal right foot  Lymphadenopathy:    She has no cervical adenopathy.  Neurological: She is alert and oriented to person, place, and time. She has normal reflexes. She displays normal reflexes. No cranial nerve deficit. She exhibits normal muscle tone. Coordination normal.  Skin: Skin is warm and dry. No rash noted. No erythema.  Psychiatric: She has a normal mood and affect. Her behavior is normal. Judgment and thought content normal.          Assessment & Plan:  She will get fasting labs today to check her lipids and her thyroid levels, etc. Her HTN is stable. We wil set up carotid dopplers to evaluate for plaque. Test for antibodies and for antigens to hepatitis B, also screen for hepatitis C. I think her cough is from PND so she will try Claritin daily. She has a ganglion cyst on the right foot so she will contact Dr. Veverly Fells, her orthopedist, to check this.  [name], MD

## 2017-10-09 ENCOUNTER — Ambulatory Visit (HOSPITAL_COMMUNITY)
Admission: RE | Admit: 2017-10-09 | Payer: Medicare HMO | Source: Ambulatory Visit | Attending: Family Medicine | Admitting: Family Medicine

## 2017-10-09 LAB — HEPATITIS C ANTIBODY
Hepatitis C Ab: NONREACTIVE
SIGNAL TO CUT-OFF: 0.07 (ref <1.00)

## 2017-10-09 LAB — HEPATITIS B SURFACE ANTIBODY,QUALITATIVE: HEP B S AB: REACTIVE — AB

## 2017-10-09 LAB — HEPATITIS B SURFACE ANTIGEN: HEP B S AG: NONREACTIVE

## 2017-10-09 LAB — HEPATITIS B E ANTIGEN: Hep B E Ag: NONREACTIVE

## 2017-10-09 LAB — HEPATITIS B E ANTIBODY: Hep B E Ab: NONREACTIVE

## 2017-10-11 ENCOUNTER — Telehealth: Payer: Self-pay | Admitting: *Deleted

## 2017-10-11 ENCOUNTER — Ambulatory Visit (HOSPITAL_COMMUNITY)
Admission: RE | Admit: 2017-10-11 | Discharge: 2017-10-11 | Disposition: A | Discharge status: Home / Self Care | Payer: Medicare HMO | Source: Ambulatory Visit | Attending: Cardiovascular Disease | Admitting: Cardiovascular Disease

## 2017-10-11 DIAGNOSIS — M545 Low back pain, unspecified: Secondary | ICD-10-CM

## 2017-10-11 DIAGNOSIS — I6523 Occlusion and stenosis of bilateral carotid arteries: Secondary | ICD-10-CM | POA: Insufficient documentation

## 2017-10-11 DIAGNOSIS — M546 Pain in thoracic spine: Secondary | ICD-10-CM

## 2017-10-11 DIAGNOSIS — M542 Cervicalgia: Secondary | ICD-10-CM

## 2017-10-11 NOTE — Telephone Encounter (Signed)
Sent to PCP to advise 

## 2017-10-11 NOTE — Telephone Encounter (Signed)
The referral was done  

## 2017-10-11 NOTE — Telephone Encounter (Signed)
Copied from [place] [id number]. Topic: General - Other >> Oct 10, 2017  3:52 PM Carolyn Stare wrote:  Pt call to say she saw Dr Sarajane Jews back on 09/10/17 for her back and was told if it did not get better maybe she could try physical therapy.  She said [facility]   Pt phone number [phone]

## 2017-10-14 NOTE — Telephone Encounter (Signed)
Called and spoke with pt. Pt advised and voiced understanding.  

## 2017-10-15 NOTE — Telephone Encounter (Signed)
Copied from [place] [id number]. Topic: Inquiry >> Oct 15, 2017  2:35 PM Pricilla Handler wrote: Reason for CRM: Patient called stating that the referral prescription for PT that Dr. Sarajane Jews wrote for her was for her neck, but should be for her back. Patient needs a corrected order for PT for her back to be sent to [facility] on [facility] in [place]. Patient states that once [facility] receives the corrected PT Order, they will schedule her PT.       Thank You!!!

## 2017-10-16 ENCOUNTER — Ambulatory Visit (INDEPENDENT_AMBULATORY_CARE_PROVIDER_SITE_OTHER): Payer: Medicare HMO | Admitting: Family Medicine

## 2017-10-16 ENCOUNTER — Encounter: Payer: Self-pay | Admitting: Family Medicine

## 2017-10-16 VITALS — BP 110/62 | HR 70 | Temp 98.4°F | Ht 64.0 in | Wt 177.4 lb

## 2017-10-16 DIAGNOSIS — L309 Dermatitis, unspecified: Secondary | ICD-10-CM

## 2017-10-16 MED ORDER — LEVOTHYROXINE SODIUM 112 MCG PO TABS: 112.0000 ug | ORAL_TABLET | Freq: Every day | ORAL | 3 refills | Status: DC

## 2017-10-16 MED ORDER — METHYLPREDNISOLONE 4 MG PO TBPK: ORAL_TABLET | ORAL | 0 refills | Status: DC

## 2017-10-16 NOTE — Progress Notes (Signed)
   Subjective:    Patient ID: Meghan Dixon, female    DOB: 05-Mar-1946, 72 y.o.   MRN: [id number]  HPI Here for 2 days of redness and itching of the right ear lobe. No pain. No other areas of skin irritation. No URI symptoms. No recent swimming. Benadryl cream helps a little.    Review of Systems  Constitutional: Negative.   HENT: Negative for congestion, ear discharge, ear pain, facial swelling, postnasal drip, sinus pressure and sore throat.   Eyes: Negative.   Respiratory: Negative.   Skin: Positive for color change.       Objective:   Physical Exam  Constitutional: She appears well-developed and well-nourished.  HENT:  Left Ear: External ear normal.  Nose: Nose normal.  Mouth/Throat: Oropharynx is clear and moist.  Right ear lobe is red and warm, not tender. The canal and TM are clear. No vesicles or bite marks are seen   Eyes: Conjunctivae are normal.  Neck: No thyromegaly present.  Pulmonary/Chest: Effort normal and breath sounds normal.  Lymphadenopathy:    She has no cervical adenopathy.          Assessment & Plan:  Inflammation of the ear lobe without infection. Early contact dermatitis is a likely etiology. Given a Medrol dose pack and she can apply cool compresses.  [name], MD

## 2017-10-18 NOTE — Telephone Encounter (Signed)
I sent in the new PT order

## 2017-10-21 NOTE — Telephone Encounter (Signed)
Called and spoke with pt. Pt advised and voiced understanding.  

## 2017-10-23 DIAGNOSIS — H5213 Myopia, bilateral: Secondary | ICD-10-CM | POA: Diagnosis not present

## 2017-10-24 DIAGNOSIS — M545 Low back pain: Secondary | ICD-10-CM | POA: Diagnosis not present

## 2017-10-29 DIAGNOSIS — M545 Low back pain: Secondary | ICD-10-CM | POA: Diagnosis not present

## 2017-11-01 DIAGNOSIS — M545 Low back pain: Secondary | ICD-10-CM | POA: Diagnosis not present

## 2017-11-05 DIAGNOSIS — M545 Low back pain: Secondary | ICD-10-CM | POA: Diagnosis not present

## 2017-11-07 DIAGNOSIS — M545 Low back pain: Secondary | ICD-10-CM | POA: Diagnosis not present

## 2017-11-11 DIAGNOSIS — M545 Low back pain: Secondary | ICD-10-CM | POA: Diagnosis not present

## 2017-11-13 DIAGNOSIS — M19079 Primary osteoarthritis, unspecified ankle and foot: Secondary | ICD-10-CM | POA: Diagnosis not present

## 2017-11-13 DIAGNOSIS — M79671 Pain in right foot: Secondary | ICD-10-CM | POA: Diagnosis not present

## 2017-11-14 DIAGNOSIS — M545 Low back pain: Secondary | ICD-10-CM | POA: Diagnosis not present

## 2017-11-21 DIAGNOSIS — Z0100 Encounter for examination of eyes and vision without abnormal findings: Secondary | ICD-10-CM | POA: Diagnosis not present

## 2017-11-21 DIAGNOSIS — M545 Low back pain: Secondary | ICD-10-CM | POA: Diagnosis not present

## 2017-11-25 DIAGNOSIS — M545 Low back pain: Secondary | ICD-10-CM | POA: Diagnosis not present

## 2017-11-27 DIAGNOSIS — M545 Low back pain: Secondary | ICD-10-CM | POA: Diagnosis not present

## 2017-12-02 DIAGNOSIS — M545 Low back pain: Secondary | ICD-10-CM | POA: Diagnosis not present

## 2017-12-20 DIAGNOSIS — M545 Low back pain: Secondary | ICD-10-CM | POA: Diagnosis not present

## 2017-12-30 ENCOUNTER — Encounter: Payer: Self-pay | Admitting: Family Medicine

## 2017-12-30 ENCOUNTER — Ambulatory Visit (INDEPENDENT_AMBULATORY_CARE_PROVIDER_SITE_OTHER): Payer: Medicare HMO | Admitting: Family Medicine

## 2017-12-30 VITALS — BP 138/68 | HR 62 | Temp 98.3°F | Wt 176.5 lb

## 2017-12-30 DIAGNOSIS — R0789 Other chest pain: Secondary | ICD-10-CM

## 2017-12-30 DIAGNOSIS — R42 Dizziness and giddiness: Secondary | ICD-10-CM

## 2017-12-30 DIAGNOSIS — J3089 Other allergic rhinitis: Secondary | ICD-10-CM | POA: Diagnosis not present

## 2017-12-30 MED ORDER — MECLIZINE HCL 25 MG PO TABS: 25.0000 mg | ORAL_TABLET | ORAL | 2 refills | Status: DC | PRN

## 2017-12-30 NOTE — Progress Notes (Signed)
   Subjective:    Patient ID: Meghan Dixon, female    DOB: 1945-07-15, 72 y.o.   MRN: [id number]  HPI Here for several issues. First she has a pain in the left side of the chest that started about 4 weeks ago. At first it was intermittent but now is more constant. No hx of trauma, but she waits tables part time and she notes that she always carries the tray on her left shoulder. Also she noted this morning that she feels dizzy when she moves quickly and this makes the room seem to spin around. She has felt congested in the head for several weeks.    Review of Systems  Constitutional: Negative.   HENT: Positive for congestion and sinus pressure. Negative for ear pain, postnasal drip, sinus pain and sore throat.   Eyes: Negative.   Respiratory: Negative.   Cardiovascular: Positive for chest pain. Negative for palpitations and leg swelling.  Neurological: Positive for dizziness. Negative for headaches.       Objective:   Physical Exam  Constitutional: She is oriented to person, place, and time. She appears well-developed and well-nourished.  HENT:  Right Ear: External ear normal.  Left Ear: External ear normal.  Nose: Nose normal.  Mouth/Throat: Oropharynx is clear and moist.  Eyes: Pupils are equal, round, and reactive to light. Conjunctivae and EOM are normal.  Neck: No thyromegaly present.  Cardiovascular: Normal rate, regular rhythm, normal heart sounds and intact distal pulses.  Pulmonary/Chest: Effort normal and breath sounds normal. No stridor. No respiratory distress. She has no wheezes. She has no rales.  She is tender along the rib cage on the left from just superior to the breast to the left posterior axillary line. No masses. The left breast is normal with no lumps or tenderness. The axilla has no lumps  Lymphadenopathy:    She has no cervical adenopathy.  Neurological: She is alert and oriented to person, place, and time. No cranial nerve deficit. She exhibits normal  muscle tone. Coordination normal.          Assessment & Plan:  She has some vertigo today which is related to allergies. I suggested she take Allegra daily until the end of ragweed season. Add Meclizine as needed for dizziness. The chest wall pain is from a strained intercostal muscle. She can use Ibuprofen prn. I suggested she carry the food trays with both hands in front of her at work instead of up over the left shoulder. Recheck prn.  [name], MD

## 2017-12-31 ENCOUNTER — Other Ambulatory Visit: Payer: Self-pay | Admitting: Family Medicine

## 2017-12-31 DIAGNOSIS — Z1231 Encounter for screening mammogram for malignant neoplasm of breast: Secondary | ICD-10-CM

## 2018-02-10 ENCOUNTER — Ambulatory Visit
Admission: RE | Admit: 2018-02-10 | Discharge: 2018-02-10 | Disposition: A | Discharge status: Home / Self Care | Payer: Medicare HMO | Source: Ambulatory Visit | Attending: Family Medicine | Admitting: Family Medicine

## 2018-02-10 DIAGNOSIS — Z1231 Encounter for screening mammogram for malignant neoplasm of breast: Secondary | ICD-10-CM

## 2018-02-17 ENCOUNTER — Ambulatory Visit (INDEPENDENT_AMBULATORY_CARE_PROVIDER_SITE_OTHER): Payer: Medicare HMO | Admitting: Family Medicine

## 2018-02-17 ENCOUNTER — Encounter: Payer: Self-pay | Admitting: Family Medicine

## 2018-02-17 VITALS — BP 132/70 | HR 80 | Temp 97.9°F | Wt 178.1 lb

## 2018-02-17 DIAGNOSIS — Z23 Encounter for immunization: Secondary | ICD-10-CM | POA: Diagnosis not present

## 2018-02-17 DIAGNOSIS — M5431 Sciatica, right side: Secondary | ICD-10-CM

## 2018-02-17 MED ORDER — CYCLOBENZAPRINE HCL 10 MG PO TABS: 10.0000 mg | ORAL_TABLET | Freq: Every day | ORAL | 2 refills | Status: DC

## 2018-02-17 MED ORDER — METHYLPREDNISOLONE 4 MG PO TBPK: ORAL_TABLET | ORAL | 0 refills | Status: DC

## 2018-02-17 NOTE — Progress Notes (Signed)
   Subjective:    Patient ID: Meghan Dixon, female    DOB: 1945/12/08, 72 y.o.   MRN: [id number]  HPI Here for one month of intermittent numbness or tingling or sharp pains from the right buttock down the right leg to the foot. No back pain. No recent trauma. She takes Mobic with some relief. An Xray of the lumbar spine in 2011 showed some degenerative discs and some osteophytes.    Review of Systems  Constitutional: Negative.   Respiratory: Negative.   Cardiovascular: Negative.   Musculoskeletal: Positive for myalgias. Negative for back pain.  Neurological: Positive for numbness.       Objective:   Physical Exam  Constitutional: She appears well-developed and well-nourished.  Cardiovascular: Normal rate, regular rhythm, normal heart sounds and intact distal pulses.  Pulmonary/Chest: Effort normal and breath sounds normal.  Musculoskeletal:  The lower spine appears normal with no tenderness and full ROM. Negative SLR. She is tender over the right sciatic notch however.           Assessment & Plan:  Sciatica, treat with a Medrol dose pack and heat and Flexeril at bedtime. Recheck prn.  [name], MD

## 2018-04-02 ENCOUNTER — Telehealth: Payer: Self-pay | Admitting: *Deleted

## 2018-04-02 DIAGNOSIS — M5431 Sciatica, right side: Secondary | ICD-10-CM

## 2018-04-02 NOTE — Telephone Encounter (Signed)
Copied from [place] [id number]. Topic: General - Other >> Apr 02, 2018  8:10 AM Oneta Rack wrote: Relation to pt: self  Call back number: [phone]  Reason for call: Patient was last seen 11/4 with Dr. Sarajane Jews for  right sided sciatica nerve pain, as per patient PCP advised if symptoms didn't improved MRI would be ordered, patient requesting orders, please advise   Dr. Sarajane Jews please advise. Thanks

## 2018-04-03 NOTE — Telephone Encounter (Signed)
This was ordered 

## 2018-04-13 ENCOUNTER — Ambulatory Visit
Admission: RE | Admit: 2018-04-13 | Discharge: 2018-04-13 | Disposition: A | Discharge status: Home / Self Care | Payer: Medicare HMO | Source: Ambulatory Visit | Attending: Family Medicine | Admitting: Family Medicine

## 2018-04-13 DIAGNOSIS — M48061 Spinal stenosis, lumbar region without neurogenic claudication: Secondary | ICD-10-CM | POA: Diagnosis not present

## 2018-04-13 DIAGNOSIS — M5431 Sciatica, right side: Secondary | ICD-10-CM

## 2018-04-15 NOTE — Addendum Note (Signed)
Addended by: [name] A on: 04/15/2018 12:57 PM   Modules accepted: Orders

## 2018-04-22 ENCOUNTER — Other Ambulatory Visit: Payer: Self-pay | Admitting: Family Medicine

## 2018-04-22 MED ORDER — AMLODIPINE BESYLATE 5 MG PO TABS: 5.0000 mg | ORAL_TABLET | Freq: Every day | ORAL | 0 refills | Status: DC

## 2018-04-22 NOTE — Telephone Encounter (Signed)
Copied from [place] [id number]. Topic: Quick Communication - See Telephone Encounter >> Apr 22, 2018  8:14 AM Conception Chancy, NT wrote: CRM for notification. See Telephone encounter for: 04/22/18.  Patient is requesting a refill on amLODipine (NORVASC) 5 MG tablet.  [facility], [place] [address] Phone: [phone] Fax: [phone]

## 2018-04-22 NOTE — Telephone Encounter (Signed)
Refill request for amlodipine; last refill 10/07/17; last office visit 10/07/17; no upcoming visits noted; attempted to contact pt to schedule office visit; left message on voicemail [phone].  Requested Prescriptions  Pending Prescriptions Disp Refills  . amLODipine (NORVASC) 5 MG tablet 90 tablet 0    Sig: Take 1 tablet (5 mg total) by mouth daily.     Cardiovascular:  Calcium Channel Blockers Passed - 04/22/2018  8:21 AM      Passed - Last BP in normal range    BP Readings from Last 1 Encounters:  02/17/18 132/70         Passed - Valid encounter within last 6 months    Recent Outpatient Visits          2 months ago Right sided sciatica   [place] [facility] [place], [place]   3 months ago Chest wall pain   [place] [facility] [place], MD   6 months ago Dermatitis of external ear   [place] at [facility], Ishmael Holter, MD   6 months ago Essential hypertension   [facility] at [place], MD   7 months ago Strain of neck muscle, subsequent Education administrator at [place], MD      Future Appointments            In 4 months [employer] at [place], [facility]

## 2018-04-23 ENCOUNTER — Encounter: Payer: Self-pay | Admitting: Family Medicine

## 2018-04-23 ENCOUNTER — Ambulatory Visit (INDEPENDENT_AMBULATORY_CARE_PROVIDER_SITE_OTHER): Payer: Medicare HMO | Admitting: Family Medicine

## 2018-04-23 VITALS — BP 138/84 | HR 72 | Temp 98.2°F | Wt 179.4 lb

## 2018-04-23 DIAGNOSIS — I10 Essential (primary) hypertension: Secondary | ICD-10-CM | POA: Diagnosis not present

## 2018-04-23 DIAGNOSIS — G8929 Other chronic pain: Secondary | ICD-10-CM | POA: Diagnosis not present

## 2018-04-23 DIAGNOSIS — M79604 Pain in right leg: Secondary | ICD-10-CM | POA: Diagnosis not present

## 2018-04-23 DIAGNOSIS — M545 Low back pain, unspecified: Secondary | ICD-10-CM

## 2018-04-23 MED ORDER — AMLODIPINE BESYLATE 5 MG PO TABS: 5.0000 mg | ORAL_TABLET | Freq: Every day | ORAL | 0 refills | Status: DC

## 2018-04-23 NOTE — Progress Notes (Signed)
   Subjective:    Patient ID: Meghan Dixon, female    DOB: Oct 19, 1945, 73 y.o.   MRN: [id number]  HPI Here for follow up on BP. She feels fine except for some low back pain. She saw Dr. Kathyrn Sheriff this morning and he said her herniated discs do not warrant a surgery. He is arranging for her to have some PT. Her BP has been stable at home.    Review of Systems  Constitutional: Negative.   Respiratory: Negative.   Cardiovascular: Negative.   Musculoskeletal: Positive for back pain.  Neurological: Negative.        Objective:   Physical Exam Constitutional:      Appearance: Normal appearance.  Cardiovascular:     Rate and Rhythm: Normal rate and regular rhythm.     Pulses: Normal pulses.     Heart sounds: Normal heart sounds.  Pulmonary:     Effort: Pulmonary effort is normal.     Breath sounds: Normal breath sounds.  Musculoskeletal:     Right lower leg: No edema.     Left lower leg: No edema.  Neurological:     Mental Status: She is alert.           Assessment & Plan:  Her HTN is stable. Meds were refilled. She will try PT for the back pain. [name], MD

## 2018-05-14 ENCOUNTER — Ambulatory Visit: Payer: Medicare HMO

## 2018-05-21 ENCOUNTER — Ambulatory Visit: Payer: Medicare HMO

## 2018-05-27 ENCOUNTER — Ambulatory Visit: Payer: Medicare HMO

## 2018-07-03 ENCOUNTER — Ambulatory Visit (INDEPENDENT_AMBULATORY_CARE_PROVIDER_SITE_OTHER): Payer: Medicare HMO | Admitting: Internal Medicine

## 2018-07-03 ENCOUNTER — Other Ambulatory Visit: Payer: Self-pay

## 2018-07-03 ENCOUNTER — Ambulatory Visit: Payer: Medicare HMO | Admitting: Internal Medicine

## 2018-07-03 ENCOUNTER — Encounter: Payer: Self-pay | Admitting: Internal Medicine

## 2018-07-03 ENCOUNTER — Ambulatory Visit: Payer: Medicare HMO | Admitting: Family Medicine

## 2018-07-03 ENCOUNTER — Telehealth: Payer: Self-pay

## 2018-07-03 VITALS — BP 140/80 | HR 73 | Temp 98.3°F | Wt 178.0 lb

## 2018-07-03 DIAGNOSIS — B9789 Other viral agents as the cause of diseases classified elsewhere: Secondary | ICD-10-CM

## 2018-07-03 DIAGNOSIS — J069 Acute upper respiratory infection, unspecified: Secondary | ICD-10-CM

## 2018-07-03 NOTE — Progress Notes (Signed)
Acute Office Visit     CC/Reason for Visit: sore throat  HPI: Meghan Dixon is a 73 y.o. female who is coming in today for the above mentioned reasons. Past Medical History is significant for seasonal allergies and hypertension.  Patient c/o a scratchy throat and congestion in her chest that started on Monday.  She says she's been having a non productive cough and feels drainage going down the back of her throat.  She c/o throat and chest buring and irritation.  Denies SOB or chest pain.  As of this am her right ear has been hurting.  Patient said she was pulling weeds in her yard on Friday and Saturday.  Denies fever, N/V/D.  Denies diff breathing or wheezing. No recent travel or sick contacts.   Past Medical/Surgical History: Past Medical History:  Diagnosis Date  . Allergy    allergic rhinitis  . Arthritis    foot  . HSV infection   . Hypertension   . Hyperthyroidism   . Osteopenia     Past Surgical History:  Procedure Laterality Date  . ABDOMINAL HYSTERECTOMY     for bleeding and ovarian cyst  . APPENDECTOMY    . CARPAL TUNNEL RELEASE     left  . COLONOSCOPY  04-30-11   per Dr. Ardis Hughs, no polyps, repeat in 10 yrs   . ESOPHAGOGASTRODUODENOSCOPY  04-30-11   per Dr. Ardis Hughs, gastritis, H pylori neg   . FOOT SURGERY     bilateral  . TONSILLECTOMY      Social History:  reports that she has never smoked. She has never used smokeless tobacco. She reports that she does not drink alcohol or use drugs.  Allergies: Allergies  Allergen Reactions  . Augmentin [Amoxicillin-Pot Clavulanate] Other (See Comments)    Extreme heartburn  . Beta Adrenergic Blockers     REACTION: reaction not known  . Cefuroxime Axetil Hives    Throat closes  . Cetirizine Hcl     REACTION: dizzy  . Fexofenadine     REACTION: dizzy  . Prednisone     Mood changer with higher doses     Family History:  Family History  Problem Relation Age of Onset  . Heart disease Mother   . Cancer  Father        esophageal CA  . Alcohol abuse Father   . Arthritis Sister        RA  . Heart disease Brother        CAD  . Colon cancer Brother 24  . Lung cancer Brother   . Breast cancer Cousin   . Stomach cancer Neg Hx      Current Outpatient Medications:  .  [START ON 07/21/2018] amLODipine (NORVASC) 5 MG tablet, Take 1 tablet (5 mg total) by mouth daily., Disp: 90 tablet, Rfl: 0 .  Cholecalciferol (VITAMIN D) 2000 units tablet, Take 2,000 Units by mouth daily. , Disp: , Rfl:  .  cyclobenzaprine (FLEXERIL) 10 MG tablet, Take 1 tablet (10 mg total) by mouth at bedtime. (Patient taking differently: Take 10 mg by mouth at bedtime as needed. ), Disp: 30 tablet, Rfl: 2 .  Glucosamine-Chondroit-Vit C-Mn (GLUCOSAMINE 1500 COMPLEX PO), Take 2 tablets by mouth once. , Disp: , Rfl:  .  levothyroxine (SYNTHROID, LEVOTHROID) 112 MCG tablet, Take 1 tablet (112 mcg total) by mouth daily., Disp: 90 tablet, Rfl: 3 .  meclizine (ANTIVERT) 25 MG tablet, Take 1 tablet (25 mg total) by mouth every 4 (  four) hours as needed for dizziness., Disp: 60 tablet, Rfl: 2 .  meloxicam (MOBIC) 15 MG tablet, Take 1 tablet (15 mg total) by mouth daily., Disp: 90 tablet, Rfl: 3 .  methylPREDNISolone (MEDROL DOSEPAK) 4 MG TBPK tablet, As directed, Disp: 21 tablet, Rfl: 0 .  Multiple Vitamin (MULTIVITAMIN) capsule, Take 1 capsule by mouth daily.  , Disp: , Rfl:   Review of Systems: Constitutional: Denies fever, chills, diaphoresis, appetite change and fatigue.  HEENT: Denies photophobia, eye pain, redness, hearing loss, ear pain, congestion,  rhinorrhea, sneezing, mouth sores, trouble swallowing, neck pain, neck stiffness and tinnitus.  C/o sore throat and throat drainage Respiratory: Denies SOB, DOE, chest tightness,  and wheezing.  C/o nonproductive cough Cardiovascular: Denies chest pain, palpitations and leg swelling.  Gastrointestinal: Denies nausea, vomiting, abdominal pain, diarrhea, constipation, blood in stool and  abdominal distention.  Genitourinary: Denies dysuria, urgency, frequency, hematuria, flank pain and difficulty urinating.  Endocrine: Denies: hot or cold intolerance, sweats, changes in hair or nails, polyuria, polydipsia. Musculoskeletal: Denies myalgias, back pain, joint swelling, arthralgias and gait problem.  Skin: Denies pallor, rash and wound.  Neurological: Denies dizziness, seizures, syncope, weakness, light-headedness, numbness and headaches.  Hematological: Denies adenopathy. Easy bruising, personal or family bleeding history  Psychiatric/Behavioral: Denies suicidal ideation, mood changes, confusion, nervousness, sleep disturbance and agitation  Physical Exam: Vitals:   07/03/18 0956  BP: 140/80  Pulse: 73  Temp: 98.3 F (36.8 C)  TempSrc: Oral  SpO2: 97%  Weight: 178 lb (80.7 kg)    Body mass index is 30.55 kg/m.   Constitutional: NAD, calm, comfortable Eyes: PERRL, lids and conjunctivae normal ENMT: Mucous membranes are moist. Posterior pharynx erythematous but clear of any exudate or lesions. Normal dentition. Tympanic membrane is pearly white, no erythema or bulging. Respiratory: clear to auscultation bilaterally, no wheezing, no crackles. Normal respiratory effort. No accessory muscle use.  Cardiovascular: Regular rate and rhythm, no murmurs / rubs / gallops. No extremity edema. 2+ pedal pulses.  Psychiatric: Normal judgment and insight. Alert and oriented x 3. Normal mood.    Impression and Plan:  Viral URI with cough  -Given exam findings, PNA, pharyngitis, ear infection are not likely, hence abx have not been prescribed. -Have advised rest, fluids, OTC antihistamines, cough suppressants and mucinex. -RTC if no improvement in 10-14 days.     Patient Instructions  -I hope you feel better soon!  -May use zyrtec/claritin once a day, mucinex twice a day, delsym twice a day as needed for cough and over the counter flu/sinues medication as needed for pain and  congestion.  -Come back to see [place] if no improvement in 10-14 days.   Pharyngitis Pharyngitis is a sore throat (pharynx). This is when there is redness, pain, and swelling in your throat. Most of the time, this condition gets better on its own. In some cases, you may need medicine. Follow these instructions at home:  Take over-the-counter and prescription medicines only as told by your doctor. ? If you were prescribed an antibiotic medicine, take it as told by your doctor. Do not stop taking the antibiotic even if you start to feel better. ? Do not give children aspirin. Aspirin has been linked to Reye syndrome.  Drink enough water and fluids to keep your pee (urine) clear or pale yellow.  Get a lot of rest.  Rinse your mouth (gargle) with a salt-water mixture 3-4 times a day or as needed. To make a salt-water mixture, completely dissolve -1 tsp of  salt in 1 cup of warm water.  If your doctor approves, you may use throat lozenges or sprays to soothe your throat. Contact a doctor if:  You have large, tender lumps in your neck.  You have a rash.  You cough up green, yellow-brown, or bloody spit. Get help right away if:  You have a stiff neck.  You drool or cannot swallow liquids.  You cannot drink or take medicines without throwing up.  You have very bad pain that does not go away with medicine.  You have problems breathing, and it is not from a stuffy nose.  You have new pain and swelling in your knees, ankles, wrists, or elbows. Summary  Pharyngitis is a sore throat (pharynx). This is when there is redness, pain, and swelling in your throat.  If you were prescribed an antibiotic medicine, take it as told by your doctor. Do not stop taking the antibiotic even if you start to feel better.  Most of the time, pharyngitis gets better on its own. Sometimes, you may need medicine. This information is not intended to replace advice given to you by your health care provider.  Make sure you discuss any questions you have with your health care provider. Document Released: 09/19/2007 Document Revised: 05/08/2016 Document Reviewed: 05/08/2016 Elsevier Interactive Patient Education  2019 [employer]  Allergic Rhinitis, Adult Allergic rhinitis is a reaction to allergens in the air. Allergens are tiny specks (particles) in the air that cause your body to have an allergic reaction. This condition cannot be passed from person to person (is not contagious). Allergic rhinitis cannot be cured, but it can be controlled. There are two types of allergic rhinitis: Seasonal. This type is also called hay fever. It happens only during certain times of the year. Perennial. This type can happen at any time of the year. What are the causes? This condition may be caused by: Pollen from grasses, trees, and weeds. House dust mites. Pet dander. Mold. What are the signs or symptoms? Symptoms of this condition include: Sneezing. Runny or stuffy nose (nasal congestion). A lot of mucus in the back of the throat (postnasal drip). Itchy nose. Tearing of the eyes. Trouble sleeping. Being sleepy during day. How is this treated? There is no cure for this condition. You should avoid things that trigger your symptoms (allergens). Treatment can help to relieve symptoms. This may include: Medicines that block allergy symptoms, such as antihistamines. These may be given as a shot, nasal spray, or pill. Shots that are given until your body becomes less sensitive to the allergen (desensitization). Stronger medicines, if all other treatments have not worked. Follow these instructions at home: Avoiding allergens  Find out what you are allergic to. Common allergens include smoke, dust, and pollen. Avoid them if you can. These are some of the things that you can do to avoid allergens: Replace carpet with wood, tile, or vinyl flooring. Carpet can trap dander and dust. Clean any mold found in the  home. Do not smoke. Do not allow smoking in your home. Change your heating and air conditioning filter at least once a month. During allergy season: Keep windows closed as much as you can. If possible, use air conditioning when there is a lot of pollen in the air. Use a special filter for allergies with your furnace and air conditioner. Plan outdoor activities when pollen counts are lowest. This is usually during the early morning or evening hours. If you do go outdoors when pollen count  is high, wear a special mask for people with allergies. When you come indoors, take a shower and change your clothes before sitting on furniture or bedding. General instructions Do not use fans in your home. Do not hang clothes outside to dry. Wear sunglasses to keep pollen out of your eyes. Wash your hands right away after you touch household pets. Take over-the-counter and prescription medicines only as told by your doctor. Keep all follow-up visits as told by your doctor. This is important. Contact a doctor if: You have a fever. You have a cough that does not go away (is persistent). You start to make whistling sounds when you breathe (wheeze). Your symptoms do not get better with treatment. You have thick fluid coming from your nose. You start to have nosebleeds. Get help right away if: Your tongue or your lips are swollen. You have trouble breathing. You feel dizzy or you feel like you are going to pass out (faint). You have cold sweats. Summary Allergic rhinitis is a reaction to allergens in the air. This condition may be caused by allergens. These include pollen, dust mites, pet dander, and mold. Symptoms include a runny, itchy nose, sneezing, or tearing eyes. You may also have trouble sleeping or feel sleepy during the day. Treatment includes taking medicines and avoiding allergens. You may also get shots or take stronger medicines. Get help if you have a fever or a cough that does not stop.  Get help right away if you are short of breath. This information is not intended to replace advice given to you by your health care provider. Make sure you discuss any questions you have with your health care provider. Document Released: 08/02/2010 Document Revised: 10/22/2017 Document Reviewed: 10/22/2017 Elsevier Interactive Patient Education  2019 [place], RN DNP Student [facility]

## 2018-07-03 NOTE — Telephone Encounter (Signed)
Patient's appoinment was changed from 9:45 with Dr. Sarajane Jews to 11:30 with Dr. Jerilee Hoh.   LM making patient aware. CRM created.

## 2018-07-03 NOTE — Patient Instructions (Addendum)
-I hope you feel better soon!  -May use zyrtec/claritin once a day, mucinex twice a day, delsym twice a day as needed for cough and over the counter flu/sinues medication as needed for pain and congestion.  -Come back to see [place] if no improvement in 10-14 days.   Pharyngitis Pharyngitis is a sore throat (pharynx). This is when there is redness, pain, and swelling in your throat. Most of the time, this condition gets better on its own. In some cases, you may need medicine. Follow these instructions at home:  Take over-the-counter and prescription medicines only as told by your doctor. ? If you were prescribed an antibiotic medicine, take it as told by your doctor. Do not stop taking the antibiotic even if you start to feel better. ? Do not give children aspirin. Aspirin has been linked to Reye syndrome.  Drink enough water and fluids to keep your pee (urine) clear or pale yellow.  Get a lot of rest.  Rinse your mouth (gargle) with a salt-water mixture 3-4 times a day or as needed. To make a salt-water mixture, completely dissolve -1 tsp of salt in 1 cup of warm water.  If your doctor approves, you may use throat lozenges or sprays to soothe your throat. Contact a doctor if:  You have large, tender lumps in your neck.  You have a rash.  You cough up green, yellow-brown, or bloody spit. Get help right away if:  You have a stiff neck.  You drool or cannot swallow liquids.  You cannot drink or take medicines without throwing up.  You have very bad pain that does not go away with medicine.  You have problems breathing, and it is not from a stuffy nose.  You have new pain and swelling in your knees, ankles, wrists, or elbows. Summary  Pharyngitis is a sore throat (pharynx). This is when there is redness, pain, and swelling in your throat.  If you were prescribed an antibiotic medicine, take it as told by your doctor. Do not stop taking the antibiotic even if you start to feel  better.  Most of the time, pharyngitis gets better on its own. Sometimes, you may need medicine. This information is not intended to replace advice given to you by your health care provider. Make sure you discuss any questions you have with your health care provider. Document Released: 09/19/2007 Document Revised: 05/08/2016 Document Reviewed: 05/08/2016 Elsevier Interactive Patient Education  2019 [employer] Inc.  Allergic Rhinitis, Adult Allergic rhinitis is a reaction to allergens in the air. Allergens are tiny specks (particles) in the air that cause your body to have an allergic reaction. This condition cannot be passed from person to person (is not contagious). Allergic rhinitis cannot be cured, but it can be controlled. There are two types of allergic rhinitis: Seasonal. This type is also called hay fever. It happens only during certain times of the year. Perennial. This type can happen at any time of the year. What are the causes? This condition may be caused by: Pollen from grasses, trees, and weeds. House dust mites. Pet dander. Mold. What are the signs or symptoms? Symptoms of this condition include: Sneezing. Runny or stuffy nose (nasal congestion). A lot of mucus in the back of the throat (postnasal drip). Itchy nose. Tearing of the eyes. Trouble sleeping. Being sleepy during day. How is this treated? There is no cure for this condition. You should avoid things that trigger your symptoms (allergens). Treatment can help to relieve symptoms.  This may include: Medicines that block allergy symptoms, such as antihistamines. These may be given as a shot, nasal spray, or pill. Shots that are given until your body becomes less sensitive to the allergen (desensitization). Stronger medicines, if all other treatments have not worked. Follow these instructions at home: Avoiding allergens  Find out what you are allergic to. Common allergens include smoke, dust, and pollen. Avoid  them if you can. These are some of the things that you can do to avoid allergens: Replace carpet with wood, tile, or vinyl flooring. Carpet can trap dander and dust. Clean any mold found in the home. Do not smoke. Do not allow smoking in your home. Change your heating and air conditioning filter at least once a month. During allergy season: Keep windows closed as much as you can. If possible, use air conditioning when there is a lot of pollen in the air. Use a special filter for allergies with your furnace and air conditioner. Plan outdoor activities when pollen counts are lowest. This is usually during the early morning or evening hours. If you do go outdoors when pollen count is high, wear a special mask for people with allergies. When you come indoors, take a shower and change your clothes before sitting on furniture or bedding. General instructions Do not use fans in your home. Do not hang clothes outside to dry. Wear sunglasses to keep pollen out of your eyes. Wash your hands right away after you touch household pets. Take over-the-counter and prescription medicines only as told by your doctor. Keep all follow-up visits as told by your doctor. This is important. Contact a doctor if: You have a fever. You have a cough that does not go away (is persistent). You start to make whistling sounds when you breathe (wheeze). Your symptoms do not get better with treatment. You have thick fluid coming from your nose. You start to have nosebleeds. Get help right away if: Your tongue or your lips are swollen. You have trouble breathing. You feel dizzy or you feel like you are going to pass out (faint). You have cold sweats. Summary Allergic rhinitis is a reaction to allergens in the air. This condition may be caused by allergens. These include pollen, dust mites, pet dander, and mold. Symptoms include a runny, itchy nose, sneezing, or tearing eyes. You may also have trouble sleeping or feel  sleepy during the day. Treatment includes taking medicines and avoiding allergens. You may also get shots or take stronger medicines. Get help if you have a fever or a cough that does not stop. Get help right away if you are short of breath. This information is not intended to replace advice given to you by your health care provider. Make sure you discuss any questions you have with your health care provider. Document Released: 08/02/2010 Document Revised: 10/22/2017 Document Reviewed: 10/22/2017 Elsevier Interactive Patient Education  2019 [employer].

## 2018-08-26 ENCOUNTER — Ambulatory Visit: Payer: Medicare HMO

## 2018-09-17 DIAGNOSIS — M79642 Pain in left hand: Secondary | ICD-10-CM | POA: Diagnosis not present

## 2018-09-17 DIAGNOSIS — M1812 Unilateral primary osteoarthritis of first carpometacarpal joint, left hand: Secondary | ICD-10-CM | POA: Diagnosis not present

## 2018-10-10 ENCOUNTER — Other Ambulatory Visit: Payer: Self-pay

## 2018-10-10 ENCOUNTER — Ambulatory Visit (INDEPENDENT_AMBULATORY_CARE_PROVIDER_SITE_OTHER): Payer: Medicare HMO | Admitting: Family Medicine

## 2018-10-10 ENCOUNTER — Encounter: Payer: Self-pay | Admitting: Family Medicine

## 2018-10-10 VITALS — BP 142/80 | HR 84 | Temp 98.0°F | Ht 65.0 in | Wt 179.0 lb

## 2018-10-10 DIAGNOSIS — Z0000 Encounter for general adult medical examination without abnormal findings: Secondary | ICD-10-CM | POA: Diagnosis not present

## 2018-10-10 LAB — CBC WITH DIFFERENTIAL/PLATELET
Basophils Absolute: 0 10*3/uL (ref 0.0–0.1)
Basophils Relative: 0.6 % (ref 0.0–3.0)
Eosinophils Absolute: 0.2 10*3/uL (ref 0.0–0.7)
Eosinophils Relative: 2.7 % (ref 0.0–5.0)
HCT: 40.5 % (ref 36.0–46.0)
Hemoglobin: 13.3 g/dL (ref 12.0–15.0)
Lymphocytes Relative: 26.9 % (ref 12.0–46.0)
Lymphs Abs: 1.6 10*3/uL (ref 0.7–4.0)
MCHC: 32.8 g/dL (ref 30.0–36.0)
MCV: 93.6 fl (ref 78.0–100.0)
Monocytes Absolute: 0.6 10*3/uL (ref 0.1–1.0)
Monocytes Relative: 9.8 % (ref 3.0–12.0)
Neutro Abs: 3.5 10*3/uL (ref 1.4–7.7)
Neutrophils Relative %: 60 % (ref 43.0–77.0)
Platelets: 238 10*3/uL (ref 150.0–400.0)
RBC: 4.32 Mil/uL (ref 3.87–5.11)
RDW: 13.5 % (ref 11.5–15.5)
WBC: 5.8 10*3/uL (ref 4.0–10.5)

## 2018-10-10 LAB — BASIC METABOLIC PANEL
BUN: 14 mg/dL (ref 6–23)
CO2: 29 mEq/L (ref 19–32)
Calcium: 8.8 mg/dL (ref 8.4–10.5)
Chloride: 105 mEq/L (ref 96–112)
Creatinine, Ser: 0.68 mg/dL (ref 0.40–1.20)
GFR: 85.34 mL/min (ref >60.00)
Glucose, Bld: 90 mg/dL (ref 70–99)
Potassium: 3.8 mEq/L (ref 3.5–5.1)
Sodium: 143 mEq/L (ref 135–145)

## 2018-10-10 LAB — POC URINALSYSI DIPSTICK (AUTOMATED)
Bilirubin, UA: NEGATIVE
Blood, UA: NEGATIVE
Glucose, UA: NEGATIVE
Ketones, UA: NEGATIVE
Leukocytes, UA: NEGATIVE
Nitrite, UA: NEGATIVE
Protein, UA: NEGATIVE
Spec Grav, UA: 1.01 (ref 1.010–1.025)
Urobilinogen, UA: 0.2 E.U./dL
pH, UA: 7 (ref 5.0–8.0)

## 2018-10-10 LAB — LIPID PANEL
Cholesterol: 199 mg/dL (ref 0–200)
HDL: 61 mg/dL (ref >39.00)
LDL Cholesterol: 125 mg/dL — ABNORMAL HIGH (ref 0–99)
NonHDL: 138.26
Total CHOL/HDL Ratio: 3
Triglycerides: 68 mg/dL (ref 0.0–149.0)
VLDL: 13.6 mg/dL (ref 0.0–40.0)

## 2018-10-10 LAB — HEPATIC FUNCTION PANEL
ALT: 16 U/L (ref 0–35)
AST: 20 U/L (ref 0–37)
Albumin: 4.4 g/dL (ref 3.5–5.2)
Alkaline Phosphatase: 65 U/L (ref 39–117)
Bilirubin, Direct: 0.1 mg/dL (ref 0.0–0.3)
Total Bilirubin: 0.5 mg/dL (ref 0.2–1.2)
Total Protein: 7 g/dL (ref 6.0–8.3)

## 2018-10-10 LAB — TSH: TSH: 3.97 u[IU]/mL (ref 0.35–4.50)

## 2018-10-10 MED ORDER — DICLOFENAC SODIUM 1 % TD GEL: 2.0000 g | Freq: Four times a day (QID) | TRANSDERMAL | 5 refills | Status: DC

## 2018-10-10 MED ORDER — LEVOTHYROXINE SODIUM 112 MCG PO TABS: 112.0000 ug | ORAL_TABLET | Freq: Every day | ORAL | 3 refills | Status: DC

## 2018-10-10 MED ORDER — AMLODIPINE BESYLATE 5 MG PO TABS: 5.0000 mg | ORAL_TABLET | Freq: Every day | ORAL | 3 refills | Status: DC

## 2018-10-10 NOTE — Progress Notes (Signed)
Subjective:    Patient ID: Meghan Dixon, female    DOB: 1945/11/30, 73 y.o.   MRN: [id number]  HPI Here for a well exam. She has a few concerns. First her allergies have been acting up causing stuffy head and PND. No ST or fever or cough. Using Claritin. Also her arthritis causes pain and stiffness in number of joints, and the most pain is at the base of the left thumb. She takes Meloxicam daily.    Review of Systems  Constitutional: Negative.   HENT: Positive for congestion and postnasal drip.   Eyes: Negative.   Respiratory: Negative.   Cardiovascular: Negative.   Gastrointestinal: Negative.   Genitourinary: Negative for decreased urine volume, difficulty urinating, dyspareunia, dysuria, enuresis, flank pain, frequency, hematuria, pelvic pain and urgency.  Musculoskeletal: Positive for arthralgias.  Skin: Negative.   Neurological: Negative.   Psychiatric/Behavioral: Negative.        Objective:   Physical Exam Constitutional:      General: She is not in acute distress.    Appearance: Normal appearance. She is well-developed. She is not diaphoretic.  HENT:     Head: Normocephalic and atraumatic.     Right Ear: External ear normal.     Left Ear: External ear normal.     Nose: Nose normal.     Mouth/Throat:     Pharynx: No oropharyngeal exudate.  Eyes:     General: No scleral icterus.       Right eye: No discharge.        Left eye: No discharge.     Conjunctiva/sclera: Conjunctivae normal.     Pupils: Pupils are equal, round, and reactive to light.  Neck:     Musculoskeletal: Normal range of motion and neck supple.     Thyroid: No thyromegaly.     Vascular: No JVD.  Cardiovascular:     Rate and Rhythm: Normal rate and regular rhythm.     Heart sounds: Normal heart sounds. No murmur. No friction rub. No gallop.   Pulmonary:     Effort: Pulmonary effort is normal. No respiratory distress.     Breath sounds: Normal breath sounds. No stridor. No wheezing or rales.   Chest:     Chest wall: No tenderness.  Abdominal:     General: Bowel sounds are normal. There is no distension or abdominal bruit.     Palpations: Abdomen is soft. Abdomen is not rigid. There is no mass.     Tenderness: There is no abdominal tenderness. There is no guarding or rebound.     Hernia: No hernia is present.  Genitourinary:    Vagina: Normal. No erythema, tenderness or bleeding.     Cervix: No cervical motion tenderness, discharge or friability.     Adnexa:        Right: No mass, tenderness or fullness.         Left: No mass, tenderness or fullness.    Musculoskeletal: Normal range of motion.        General: No tenderness.  Lymphadenopathy:     Cervical: No cervical adenopathy.  Skin:    General: Skin is warm and dry.     Coloration: Skin is not pale.     Findings: No erythema or rash.  Neurological:     Mental Status: She is alert and oriented to person, place, and time.     Cranial Nerves: No cranial nerve deficit.     Motor: No abnormal muscle tone.  Coordination: Coordination normal.     Deep Tendon Reflexes: Reflexes are normal and symmetric. Reflexes normal.  Psychiatric:        Behavior: Behavior normal.        Thought Content: Thought content normal.        Judgment: Judgment normal.           Assessment & Plan:  Well exam. We discussed diet and exercise. Get fasting labs. For the allergies, she may try Flonase daily. For the hand pain, try Diclofenac gel prn. [name], MD

## 2018-10-15 ENCOUNTER — Encounter: Payer: Self-pay | Admitting: *Deleted

## 2018-10-21 ENCOUNTER — Telehealth: Payer: Self-pay | Admitting: *Deleted

## 2018-10-21 ENCOUNTER — Ambulatory Visit: Payer: Medicare HMO

## 2018-10-21 NOTE — Telephone Encounter (Signed)
Copied from [place] [id number]. Topic: General - Other >> Oct 21, 2018  8:08 AM Celene Kras A wrote: Reason for CRM: Pt called and is requesting a call to discuss her recent labs. Please advise. T is requesting a call on her mobile #.

## 2018-10-23 NOTE — Telephone Encounter (Signed)
Pt called to speak with Meghan Dixon regarding lab results. With a pt. Please return call. CB#[phone]

## 2018-10-23 NOTE — Telephone Encounter (Signed)
Patient is calling again to speak with someone regarding her lab results.  Patient stated that she was supposed to get a call back and never did.  CB# [phone]

## 2018-10-24 NOTE — Telephone Encounter (Signed)
Called and spoke with pt and she is aware of lab results.

## 2018-11-05 NOTE — Telephone Encounter (Signed)
Pt stated she requested her lab results to be mailed to her about two weeks ago when she was given the results over the phone and has not received them yet. Requesting they be mailed to her home as soon as possible. Please advise.

## 2018-12-03 ENCOUNTER — Other Ambulatory Visit: Payer: Self-pay | Admitting: Family Medicine

## 2018-12-03 DIAGNOSIS — Z1231 Encounter for screening mammogram for malignant neoplasm of breast: Secondary | ICD-10-CM

## 2019-01-21 DIAGNOSIS — H5213 Myopia, bilateral: Secondary | ICD-10-CM | POA: Diagnosis not present

## 2019-01-23 ENCOUNTER — Encounter: Payer: Self-pay | Admitting: Maternal Newborn

## 2019-01-23 ENCOUNTER — Other Ambulatory Visit: Payer: Self-pay

## 2019-01-23 ENCOUNTER — Ambulatory Visit (INDEPENDENT_AMBULATORY_CARE_PROVIDER_SITE_OTHER): Payer: Medicare HMO | Admitting: Maternal Newborn

## 2019-01-23 VITALS — BP 150/90 | Ht 65.0 in | Wt 176.0 lb

## 2019-01-23 DIAGNOSIS — B379 Candidiasis, unspecified: Secondary | ICD-10-CM

## 2019-01-23 DIAGNOSIS — B009 Herpesviral infection, unspecified: Secondary | ICD-10-CM

## 2019-01-23 MED ORDER — FLUCONAZOLE 150 MG PO TABS: 150.0000 mg | ORAL_TABLET | Freq: Once | ORAL | 0 refills | Status: AC

## 2019-01-23 MED ORDER — VALACYCLOVIR HCL 1 G PO TABS: 1000.0000 mg | ORAL_TABLET | Freq: Every day | ORAL | 6 refills | Status: AC

## 2019-01-23 NOTE — Progress Notes (Signed)
Obstetrics & Gynecology Office Visit   Chief Complaint:  Chief Complaint  Patient presents with  . Vaginal Discharge    no odor, itchiness and irritation x on/off couple weeks    History of Present Illness: Patient began to notice some thin, yellowish discharge several weeks ago, along with vulvar itching and irritation. These symptoms were intermittent, and improved when she took some Valtrex that she had at home (medication was expired). She had not previously had a herpes outbreak for many years. She notes that has been having more stress in her life lately. Came in for a visit because the symptoms have continued to come and go.   Review of Systems:   Review of Systems  Constitutional: Negative.   HENT: Positive for congestion.   Eyes: Negative.   Respiratory: Negative.   Cardiovascular: Negative.   Gastrointestinal: Negative.   Genitourinary:       Vaginal discharge, intermittent vulvar itching and irritation  Musculoskeletal: Negative.   Skin: Negative.   Neurological: Positive for dizziness and headaches.  Endo/Heme/Allergies: Positive for environmental allergies.  Psychiatric/Behavioral: Negative.   All other systems reviewed and are negative.   Past Medical History:  Past Medical History:  Diagnosis Date  . Allergy    allergic rhinitis  . Arthritis    foot  . HSV infection   . Hypertension   . Hyperthyroidism   . Osteopenia     Past Surgical History:  Past Surgical History:  Procedure Laterality Date  . ABDOMINAL HYSTERECTOMY     for bleeding and ovarian cyst  . APPENDECTOMY    . CARPAL TUNNEL RELEASE     left  . COLONOSCOPY  04-30-11   per Dr. Ardis Hughs, no polyps, repeat in 10 yrs   . ESOPHAGOGASTRODUODENOSCOPY  04-30-11   per Dr. Ardis Hughs, gastritis, H pylori neg   . FOOT SURGERY     bilateral  . TONSILLECTOMY      Gynecologic History: No LMP recorded. Patient has had a hysterectomy.  Obstetric History: G2P1011  Family History:  Family  History  Problem Relation Age of Onset  . Heart disease Mother   . Cancer Father        esophageal CA  . Alcohol abuse Father   . Arthritis Sister        RA  . Heart disease Brother        CAD  . Colon cancer Brother 53  . Lung cancer Brother   . Breast cancer Cousin   . Stomach cancer Neg Hx     Social History:  Social History   Socioeconomic History  . Marital status: Married    Spouse name: Not on file  . Number of children: 1  . Years of education: 8  . Highest education level: Not on file  Occupational History  . Occupation: Cytogeneticist: [employer]  Social Needs  . Financial resource strain: Not on file  . Food insecurity    Worry: Not on file    Inability: Not on file  . Transportation needs    Medical: Not on file    Non-medical: Not on file  Tobacco Use  . Smoking status: Never Smoker  . Smokeless tobacco: Never Used  Substance and Sexual Activity  . Alcohol use: No    Alcohol/week: 0.0 standard drinks  . Drug use: No  . Sexual activity: Yes    Birth control/protection: Post-menopausal  Lifestyle  . Physical activity    Days per  week: Not on file    Minutes per session: Not on file  . Stress: Not on file  Relationships  . Social Herbalist on phone: Not on file    Gets together: Not on file    Attends religious service: Not on file    Active member of club or organization: Not on file    Attends meetings of clubs or organizations: Not on file    Relationship status: Not on file  . Intimate partner violence    Fear of current or ex partner: Not on file    Emotionally abused: Not on file    Physically abused: Not on file    Forced sexual activity: Not on file  Other Topics Concern  . Not on file  Social History Narrative   Drinks about 4 caffeinated drinks a day    Allergies:  Allergies  Allergen Reactions  . Augmentin [Amoxicillin-Pot Clavulanate] Other (See Comments)    Extreme heartburn  . Beta Adrenergic  Blockers     REACTION: reaction not known  . Cefuroxime Axetil Hives    Throat closes  . Cetirizine Hcl     REACTION: dizzy  . Fexofenadine     REACTION: dizzy  . Prednisone     Mood changer with higher doses     Medications: Prior to Admission medications   Medication Sig Start Date End Date Taking? Authorizing Provider  amLODipine (NORVASC) 5 MG tablet Take 1 tablet (5 mg total) by mouth daily. 10/10/18  Yes [name], MD  Cholecalciferol (VITAMIN D) 2000 units tablet Take 2,000 Units by mouth daily.    Yes [provider]  Glucosamine-Chondroit-Vit C-Mn (GLUCOSAMINE 1500 COMPLEX PO) Take 2 tablets by mouth once.    Yes [provider]  levothyroxine (SYNTHROID) 112 MCG tablet Take 1 tablet (112 mcg total) by mouth daily. 10/10/18  Yes [name], MD  meclizine (ANTIVERT) 25 MG tablet Take 1 tablet (25 mg total) by mouth every 4 (four) hours as needed for dizziness. 12/30/17  Yes [name], MD  meloxicam (MOBIC) 15 MG tablet Take 1 tablet (15 mg total) by mouth daily. 10/07/17  Yes [name], MD  Multiple Vitamin (MULTIVITAMIN) capsule Take 1 capsule by mouth daily.     Yes [provider]  methylPREDNISolone (MEDROL DOSEPAK) 4 MG TBPK tablet As directed Patient not taking: Reported on 01/23/2019 02/17/18   [name], MD    Physical Exam Vitals:  Vitals:   01/23/19 1343  BP: (!) 150/90   No LMP recorded. Patient has had a hysterectomy.  General: NAD HEENT: normocephalic, anicteric Pulmonary: No increased work of breathing Genitourinary:  External: Small, healing lesion seen on inside of left labia  minora. Otherwise normal external female genitalia.   Normal urethral meatus, normal Bartholin's and Skene's  glands.    Vagina: Normal vaginal mucosa, no evidence of prolapse.   Extremities: no edema, erythema, or tenderness Neurologic: Grossly intact Psychiatric: mood appropriate, affect full   Wet Prep: PH: Less than 4 Clue  Cells: Negative Fungal elements: Positive, few  Trichomonas: Negative  Assessment: 73 y.o. G2P1011 with HSV and yeast infections  Plan: Problem List Items Addressed This Visit    None    Visit Diagnoses    Yeast infection    -  Primary   Relevant Medications   fluconazole (DIFLUCAN) 150 MG tablet   valACYclovir (VALTREX) 1000 MG tablet   HSV (herpes simplex virus) infection  Relevant Medications   valACYclovir (VALTREX) 1000 MG tablet     Evidence of healing HSV lesion. Valtrex PRN, refill sent. Wet prep positive for yeast; Rx for Diflucan to local pharmacy.  Avel Sensor, CNM 01/23/2019  2:37 PM

## 2019-02-03 ENCOUNTER — Telehealth: Payer: Self-pay

## 2019-02-03 NOTE — Telephone Encounter (Signed)
Spoke w/patient. Meghan Dixon recently prescribed Valtrex for her and it is for Quantity#5 each rx, but she has a mail order pharmacy and it takes 7-10 days to get to her and this isn't sufficient if she needs to take it within 24-48 hours of onset of symptoms. Patient requesting a #30 supply to have on hand as needed for break outs.

## 2019-02-03 NOTE — Telephone Encounter (Signed)
Patient requesting to speak to Deeann Saint, CNM regarding a new prescription she gave hear and how it came to her. [id number]

## 2019-02-04 ENCOUNTER — Other Ambulatory Visit: Payer: Self-pay | Admitting: Maternal Newborn

## 2019-02-04 DIAGNOSIS — B009 Herpesviral infection, unspecified: Secondary | ICD-10-CM

## 2019-02-04 MED ORDER — VALACYCLOVIR HCL 1 G PO TABS: 1000.0000 mg | ORAL_TABLET | Freq: Every day | ORAL | 3 refills | Status: DC

## 2019-02-04 NOTE — Telephone Encounter (Signed)
I sent the 30 day supply to her mail order pharmacy. Please let me know if she needs a local Rx as well.

## 2019-02-04 NOTE — Telephone Encounter (Signed)
Pt Aware. She will contact us if she ever needs a local fill.

## 2019-02-12 ENCOUNTER — Ambulatory Visit
Admission: RE | Admit: 2019-02-12 | Discharge: 2019-02-12 | Disposition: A | Discharge status: Home / Self Care | Payer: Medicare HMO | Source: Ambulatory Visit | Attending: Family Medicine | Admitting: Family Medicine

## 2019-02-12 ENCOUNTER — Other Ambulatory Visit: Payer: Self-pay

## 2019-02-12 DIAGNOSIS — Z1231 Encounter for screening mammogram for malignant neoplasm of breast: Secondary | ICD-10-CM

## 2019-03-23 ENCOUNTER — Other Ambulatory Visit: Payer: Self-pay

## 2019-03-24 ENCOUNTER — Ambulatory Visit (INDEPENDENT_AMBULATORY_CARE_PROVIDER_SITE_OTHER): Payer: Medicare HMO

## 2019-03-24 DIAGNOSIS — Z23 Encounter for immunization: Secondary | ICD-10-CM | POA: Diagnosis not present

## 2019-04-01 ENCOUNTER — Ambulatory Visit: Payer: Medicare HMO | Attending: Internal Medicine

## 2019-04-01 ENCOUNTER — Other Ambulatory Visit: Payer: Self-pay

## 2019-04-01 DIAGNOSIS — Z20828 Contact with and (suspected) exposure to other viral communicable diseases: Secondary | ICD-10-CM | POA: Diagnosis not present

## 2019-04-01 DIAGNOSIS — Z20822 Contact with and (suspected) exposure to covid-19: Secondary | ICD-10-CM

## 2019-04-03 LAB — NOVEL CORONAVIRUS, NAA: SARS-CoV-2, NAA: NOT DETECTED

## 2019-04-07 ENCOUNTER — Encounter: Payer: Self-pay | Admitting: Family Medicine

## 2019-04-07 ENCOUNTER — Other Ambulatory Visit: Payer: Self-pay

## 2019-04-07 ENCOUNTER — Telehealth (INDEPENDENT_AMBULATORY_CARE_PROVIDER_SITE_OTHER): Payer: Medicare HMO | Admitting: Family Medicine

## 2019-04-07 DIAGNOSIS — J019 Acute sinusitis, unspecified: Secondary | ICD-10-CM | POA: Diagnosis not present

## 2019-04-07 MED ORDER — LEVOFLOXACIN 500 MG PO TABS: 500.0000 mg | ORAL_TABLET | Freq: Every day | ORAL | 0 refills | Status: AC

## 2019-04-07 MED ORDER — METHYLPREDNISOLONE 4 MG PO TBPK: ORAL_TABLET | ORAL | 0 refills | Status: DC

## 2019-04-07 NOTE — Progress Notes (Signed)
Virtual Visit via Telephone Note  I connected with the patient on 04/07/19 at  4:15 PM EST by telephone and verified that I am speaking with the correct person using two identifiers.   I discussed the limitations, risks, security and privacy concerns of performing an evaluation and management service by telephone and the availability of in person appointments. I also discussed with the patient that there may be a patient responsible charge related to this service. The patient expressed understanding and agreed to proceed.  Location patient: home Location provider: work or home office Participants present for the call: patient, provider Patient did not have a visit in the prior 7 days to address this/these issue(s).   History of Present Illness: Here for 10 days of sinus pressure, pain in both ears, and dizziness. No fever or cough or SOB. No body aches or NVD. She tested negative for the Covid-19 virus on 04-01-19. Using Flonase, Mucinex, and Claritin.    Observations/Objective: Patient sounds cheerful and well on the phone. I do not appreciate any SOB. Speech and thought processing are grossly intact. Patient reported vitals:  Assessment and Plan: Sinusitis, treat with Levaquin and a Medrol dose pack.  [name], MD   Follow Up Instructions:     [phone] 5-10 [phone] 11-20 [id number] 21-30 I did not refer this patient for an OV in the next 24 hours for this/these issue(s).  I discussed the assessment and treatment plan with the patient. The patient was provided an opportunity to ask questions and all were answered. The patient agreed with the plan and demonstrated an understanding of the instructions.   The patient was advised to call back or seek an in-person evaluation if the symptoms worsen or if the condition fails to improve as anticipated.  I provided 10 minutes of non-face-to-face time during this encounter.   [name], MD

## 2019-06-22 ENCOUNTER — Telehealth: Payer: Self-pay | Admitting: Family Medicine

## 2019-06-22 DIAGNOSIS — G8929 Other chronic pain: Secondary | ICD-10-CM

## 2019-06-22 DIAGNOSIS — M25512 Pain in left shoulder: Secondary | ICD-10-CM

## 2019-06-22 NOTE — Telephone Encounter (Signed)
Pt would like to know if she has had all of her shingles vaccine or does she need to have them. She also would like a referral to Dr. [facility] due to her shoulder pain.

## 2019-06-22 NOTE — Telephone Encounter (Signed)
Tell her to get both Shingrix shots at her pharmacy. For the referral, which shoulder is it?

## 2019-06-22 NOTE — Telephone Encounter (Signed)
She has only had one shingles vaccine.  [place] for referral? Please advise

## 2019-06-23 NOTE — Telephone Encounter (Signed)
Tell her the shingles vaccine (Shingrix) is the same one everywhere. I will do the referral to Dr. Veverly Fells.

## 2019-06-23 NOTE — Addendum Note (Signed)
Addended by: [name] A on: 06/23/2019 12:31 PM   Modules accepted: Orders

## 2019-06-23 NOTE — Telephone Encounter (Addendum)
Pt called in to answer Fry's question. Pt stated both are bothering her but the Right one is more painful. She is also wondering if it is the same vaccine going through her pharmacy?   Pt prefers to be called at [phone] -ok to leave detailed message per pt

## 2019-06-23 NOTE — Telephone Encounter (Signed)
Patient is aware. She will get the vaccine at her pharmacy.

## 2019-07-09 ENCOUNTER — Telehealth: Payer: Self-pay | Admitting: Family Medicine

## 2019-07-09 DIAGNOSIS — M19012 Primary osteoarthritis, left shoulder: Secondary | ICD-10-CM | POA: Diagnosis not present

## 2019-07-09 DIAGNOSIS — E039 Hypothyroidism, unspecified: Secondary | ICD-10-CM

## 2019-07-09 DIAGNOSIS — M19011 Primary osteoarthritis, right shoulder: Secondary | ICD-10-CM | POA: Diagnosis not present

## 2019-07-09 DIAGNOSIS — I10 Essential (primary) hypertension: Secondary | ICD-10-CM

## 2019-07-09 DIAGNOSIS — M25512 Pain in left shoulder: Secondary | ICD-10-CM | POA: Diagnosis not present

## 2019-07-09 DIAGNOSIS — E7800 Pure hypercholesterolemia, unspecified: Secondary | ICD-10-CM

## 2019-07-09 DIAGNOSIS — M25511 Pain in right shoulder: Secondary | ICD-10-CM | POA: Diagnosis not present

## 2019-07-09 NOTE — Chronic Care Management (AMB) (Signed)
  Chronic Care Management   Note  07/09/2019 Name: Meghan Dixon MRN: [id number] DOB: [birth year]  Meghan Dixon is a 74 y.o. year old female who is a primary care patient of [name], MD. I reached out to [place] by phone today in response to a referral sent by Meghan Dixon's PCP, [name], MD.   Meghan Dixon was given information about Chronic Care Management services today including:  1. CCM service includes personalized support from designated clinical staff supervised by her physician, including individualized plan of care and coordination with other care providers 2. 24/7 contact phone numbers for assistance for urgent and routine care needs. 3. Service will only be billed when office clinical staff spend 20 minutes or more in a month to coordinate care. 4. Only one practitioner may furnish and bill the service in a calendar month. 5. The patient may stop CCM services at any time (effective at the end of the month) by phone call to the office staff.   Patient agreed to services and verbal consent obtained.   Follow up plan:  Raynicia Dukes [employer] Scheduler

## 2019-07-09 NOTE — Telephone Encounter (Signed)
I ordered the BMET. She can make a lab appt

## 2019-07-09 NOTE — Telephone Encounter (Signed)
Pt states that her orthopedic doctor-Dr. Veverly Fells- stated that since she has been on Mobic for a while that she get her kidneys tested as a precaution with Dr. Sarajane Jews.  Pt can be reached at [phone]

## 2019-07-09 NOTE — Telephone Encounter (Signed)
Please advise 

## 2019-07-09 NOTE — Telephone Encounter (Signed)
Pt has been scheduled.  °

## 2019-07-10 ENCOUNTER — Other Ambulatory Visit (INDEPENDENT_AMBULATORY_CARE_PROVIDER_SITE_OTHER): Payer: Medicare HMO

## 2019-07-10 ENCOUNTER — Other Ambulatory Visit: Payer: Self-pay

## 2019-07-10 DIAGNOSIS — I10 Essential (primary) hypertension: Secondary | ICD-10-CM

## 2019-07-10 LAB — BASIC METABOLIC PANEL
BUN: 12 mg/dL (ref 6–23)
CO2: 29 mEq/L (ref 19–32)
Calcium: 9.3 mg/dL (ref 8.4–10.5)
Chloride: 106 mEq/L (ref 96–112)
Creatinine, Ser: 0.68 mg/dL (ref 0.40–1.20)
GFR: 85.15 mL/min (ref >60.00)
Glucose, Bld: 100 mg/dL — ABNORMAL HIGH (ref 70–99)
Potassium: 4.2 mEq/L (ref 3.5–5.1)
Sodium: 141 mEq/L (ref 135–145)

## 2019-07-22 DIAGNOSIS — H43813 Vitreous degeneration, bilateral: Secondary | ICD-10-CM | POA: Diagnosis not present

## 2019-07-22 DIAGNOSIS — H2513 Age-related nuclear cataract, bilateral: Secondary | ICD-10-CM | POA: Diagnosis not present

## 2019-07-22 DIAGNOSIS — H1045 Other chronic allergic conjunctivitis: Secondary | ICD-10-CM | POA: Diagnosis not present

## 2019-07-22 DIAGNOSIS — H52223 Regular astigmatism, bilateral: Secondary | ICD-10-CM | POA: Diagnosis not present

## 2019-07-22 DIAGNOSIS — H04123 Dry eye syndrome of bilateral lacrimal glands: Secondary | ICD-10-CM | POA: Diagnosis not present

## 2019-07-22 DIAGNOSIS — H524 Presbyopia: Secondary | ICD-10-CM | POA: Diagnosis not present

## 2019-07-22 DIAGNOSIS — H5213 Myopia, bilateral: Secondary | ICD-10-CM | POA: Diagnosis not present

## 2019-07-31 NOTE — Addendum Note (Signed)
Addended by: [name] A on: 07/31/2019 07:45 AM   Modules accepted: Orders

## 2019-08-04 ENCOUNTER — Ambulatory Visit: Payer: Medicare HMO

## 2019-08-04 ENCOUNTER — Other Ambulatory Visit: Payer: Self-pay

## 2019-08-04 DIAGNOSIS — M79671 Pain in right foot: Secondary | ICD-10-CM

## 2019-08-04 DIAGNOSIS — E559 Vitamin D deficiency, unspecified: Secondary | ICD-10-CM

## 2019-08-04 DIAGNOSIS — E039 Hypothyroidism, unspecified: Secondary | ICD-10-CM

## 2019-08-04 DIAGNOSIS — E7800 Pure hypercholesterolemia, unspecified: Secondary | ICD-10-CM

## 2019-08-04 DIAGNOSIS — I10 Essential (primary) hypertension: Secondary | ICD-10-CM

## 2019-08-04 NOTE — Chronic Care Management (AMB) (Signed)
Chronic Care Management Pharmacy  Name: Marlei Harrah Knueppel  MRN: [id number] DOB: 1946-03-05  Initial Questions: 1. Have you seen any other providers since your last visit? NA 2. Any changes in your medicines or health? No   Chief Complaint/ HPI  Evern Core Shatto,  74 y.o. , female presents for their Initial CCM visit with the clinical pharmacist via telephone due to COVID-19 Pandemic.  PCP : [name], MD  Their chronic conditions include: HTN, HLD, hypothyroid, arthritis, vitamin D deficiency  Office Visits: 04/07/2019- Virtual visit-Patient presented to Dr. [name], MD for acute sinusitis. Patient complained of sinus pressure for 10 days and pain in ears with dizziness. Patient using Flonase, Mucinex, Claritin. Plan: patient prescribed Levaquin and Medrol dosepak for sinusitis.   Consult Visit: 01/23/2019- OB/GYN- Patient presented for office visit with Avel Sensor, CNM for vaginal discharge. Patient has history of HSV lesion. Wet prep was positive for yeast. Patient to continue Valtrex as needed. Patient was prescribed: Diflucan.   Medications: Outpatient Encounter Medications as of 08/04/2019  Medication Sig  . acetaminophen (TYLENOL) 650 MG CR tablet Take 650 mg by mouth every 8 (eight) hours as needed for pain.  Marland Kitchen amLODipine (NORVASC) 5 MG tablet Take 1 tablet (5 mg total) by mouth daily.  . Cholecalciferol (VITAMIN D) 2000 units tablet Take 2,000 Units by mouth daily.   . fluticasone (FLONASE) 50 MCG/ACT nasal spray Place 1 spray into both nostrils daily.  . Glucosamine-Chondroit-Vit C-Mn (GLUCOSAMINE 1500 COMPLEX PO) Take 2 tablets by mouth once.   Marland Kitchen ibuprofen (ADVIL) 200 MG tablet Take 200 mg by mouth as needed.  Marland Kitchen levothyroxine (SYNTHROID) 112 MCG tablet Take 1 tablet (112 mcg total) by mouth daily.  Marland Kitchen loratadine (CLARITIN) 10 MG tablet Take 10 mg by mouth daily.  . meclizine (ANTIVERT) 25 MG tablet Take 1 tablet (25 mg total) by mouth every 4 (four) hours as needed  for dizziness.  . meloxicam (MOBIC) 15 MG tablet Take 1 tablet (15 mg total) by mouth daily.  . Multiple Vitamin (MULTIVITAMIN) capsule Take 1 capsule by mouth daily.    Marland Kitchen trolamine salicylate (ASPERCREME) 10 % cream Apply 1 application topically as needed for muscle pain.  . TURMERIC PO Take 800 mg by mouth daily.  . valACYclovir (VALTREX) 1000 MG tablet Take 1 tablet (1,000 mg total) by mouth daily. Take for 5 days.  . vitamin E (VITAMIN E) 180 MG (400 UNITS) capsule Take 400 Units by mouth daily.  . methylPREDNISolone (MEDROL DOSEPAK) 4 MG TBPK tablet As directed (Patient not taking: Reported on 08/04/2019)   No facility-administered encounter medications on file as of 08/04/2019.     Current Diagnosis/Assessment:  Goals Addressed            This Visit's Progress   . Pharmacy Care Plan       CARE PLAN ENTRY  Current Barriers:  . Chronic Disease Management support, education, and care coordination needs related to  high blood pressure, high cholesterol, hypothyroidism, arthritis, vitamin D deficiency  Controlled hight blood pressure per home blood pressure readings. . Current antihypertensive regimen: Amlodipine 5mg , 1 tablet once daily  . Previous antihypertensives tried: none . Last practice recorded BP readings:  BP Readings from Last 3 Encounters:  01/23/19 (!) 150/90  10/10/18 (!) 142/80  07/03/18 140/80 .  Current home BP readings:  120s-130s/76-80  Uncontrolled high cholesterol based on previous lab results . Current antihyperlipidemic regimen: lifestyle modifications . Previous antihyperlipidemic medications tried none . Most recent  lipid panel:     Component Value Date/Time   CHOL 199 10/10/2018 0954   TRIG 68.0 10/10/2018 0954   HDL 61.00 10/10/2018 0954   CHOLHDL 3 10/10/2018 0954   VLDL 13.6 10/10/2018 0954   LDLCALC 125 (H) 10/10/2018 0954   LDLDIRECT 118.5 06/05/2006 1014 .  The 10-year ASCVD risk score [name], et al., 2013) is:  18.4%  Hypothyroid . Current regimen:  levothyroxine 162mcg, 1 tablet once daily  TSH  Date Value Ref Range Status  10/10/2018 3.97 0.35 - 4.50 uIU/mL Final  10/07/2017 3.41 0.35 - 4.50 uIU/mL Final  06/05/2016 1.13 0.35 - 4.50 uIU/mL Final   Arthritis/ pain  Current regimen:   Meloxicam (Mobic) 15mg , 1 tablet once daily   Tylenol arthritis, as needed  ibuprofen 200mg  as needed  Aspercreme 10% cream, apply as needed for muscle pain  Vitamin D deficiency  Current regimen: Cholecalciferol (vitamin D) 2000 units, 1 tablet once daily   Vitamin D level: 39.69 (10/07/2017)  Pharmacist Clinical Goal(s):  . High blood pressure:  Marland Kitchen Maintain Blood pressure <130/80 mmHg  . High cholesterol:  o Over the next 2 days, patient will work with PharmD and providers towards optimized antihyperlipidemic therapy if appropriate o At next physical, obtain fasting blood work (including cholesterol levels).  . Cholesterol goals: Total Cholesterol goal under 200, Triglycerides goal under 150, HDL goal above 40 (men) or above 50 (women), LDL goal under 100.  Marland Kitchen Hypothyroidism . Maintain TSH between 0.45 to 4.5uIU/ml. . Vitamin D deficiency:  . Maintain Vitamin D-25 level at or above 30.   Interventions: . Comprehensive medication review performed; medication list updated in electronic medical record.  Bertram Savin care team collaboration . High blood pressure . Discussed diet modifications. DASH diet:  following a diet emphasizing fruits and vegetables and low-fat dairy products along with whole grains, fish, poultry, and nuts. Reducing red meats and sugars.  . Exercising . Reducing the amount of salt intake to 1500mg /per day.  . Recommend using a salt substitute to replace your salt if you need flavor.    . Weight reduction- We discussed losing 5-10% of body weight . High cholesterol o We discussed how a diet high in plant sterols (fruits/vegetables/nuts/whole grains/legumes) may  reduce your cholesterol.  Encouraged increasing fiber to a daily intake of 10-25g/day.   Patient Self Care Activities:  . Continue current medications as directed by providers.  . Continue following up with primary care provider and/or specialists. . Continue at home blood pressure readings. . Continue working on healthy habits (diet/ exercise).   Initial goal documentation        SDOH Interventions     Most Recent Value  SDOH Interventions  SDOH Interventions for the Following Domains  Financial Strain, Transportation  Financial Strain Interventions  Other (Comment) [Not needed]  Transportation Interventions  Other (Comment) [Not needed]       Hypertension  BP today is:  <140/90  Patient endorses dizziness/ lightheadedness for many years. Patient has been prescribed Meclizine and will take 1/4 of the 25mg  tablet.   Office blood pressures are  BP Readings from Last 3 Encounters:  01/23/19 (!) 150/90  10/10/18 (!) 142/80  07/03/18 140/80   Patient has failed these meds in the past: none  Patient checks BP at home 1-2x per week  Patient home BP readings are ranging: 120s-130s/76-80  Patient is controlled on:   Amlodipine 5mg , 1 tablet once daily   We discussed diet and exercise extensively.  Marland Kitchen  Discussed diet modifications. DASH diet:  following a diet emphasizing fruits and vegetables and low-fat dairy products along with whole grains, fish, poultry, and nuts. Reducing red meats and sugars.  - Patient endorsed eating fried food very seldom. And cooks quite a bit on the grill. - [place]er, chicken, [place], salmon.  - Snacks: has fruit.  Marland Kitchen Exercising - Patient reports not going to [employer] anymore.  - Her activities include: gardening and going on walks.  - She states she tracks her steps: >6000 steps most day.  . Reducing the amount of salt intake to 1500mg /per day.  . Recommend using a salt substitute to replace your salt if you need flavor.    . Weight reduction- We  discussed losing 5-10% of body weight  Plan Continue current medications and control with diet and exercise   Hyperlipidemia   Lipid Panel     Component Value Date/Time   CHOL 199 10/10/2018 0954   TRIG 68.0 10/10/2018 0954   HDL 61.00 10/10/2018 0954   CHOLHDL 3 10/10/2018 0954   VLDL 13.6 10/10/2018 0954   LDLCALC 125 (H) 10/10/2018 0954   LDLDIRECT 118.5 06/05/2006 1014    The 10-year ASCVD risk score [name], et al., 2013) is: 18.4%   Values used to calculate the score:     Age: 89 years     Sex: Female     Is Non-Hispanic African American: No     Diabetic: No     Tobacco smoker: No     Systolic Blood Pressure: [id number] mmHg     Is BP treated: Yes     HDL Cholesterol: 61 mg/dL     Total Cholesterol: 199 mg/dL   Patient has failed these meds in past: none  Patient is currently controlled on the following medications:  - lifestyle modifications  We discussed:  diet and exercise extensively.   . How to reduce cholesterol through diet/weight management and physical activity.    - (patient notes eating bacon once a week and [place] sausage) . We discussed how a diet high in plant sterols (fruits/vegetables/nuts/whole grains/legumes) may reduce your cholesterol.  Encouraged increasing fiber to a daily intake of 10-25g/day.   Plan Patient to obtain fasting labs at next physical with Dr. Sarajane Jews on 10/14/2019 (since patient stated she just returned from vacation when previous labs were obtained).  Continue current medications.   Hypothyroidism   TSH  Date Value Ref Range Status  10/10/2018 3.97 0.35 - 4.50 uIU/mL Final   Patient has failed these meds in past: none Patient is currently controlled on the following medications:  - levothyroxine 157mcg, 1 tablet once daily   Plan Continue current medications  Arthritis  Patient has failed these meds in past: none Patient is currently controlled on the following medications:   Meloxicam (Mobic) 15mg , 1 tablet once daily  (patient reports using as needed; she can take it a couple of days and then get off it. Never a whole week).   Tylenol arthritis, takes as needed  ibuprofen 200mg  (patient reported not taking on same days she takes meloxicam)  Aspercreme 10% cream, apply as needed for muscle pain (patient reports using couple of times in a week on joints)  We discussed:  - duplication of meloxicam and ibuprofen and increased risk of ADRs. Patient reported she not does take both on the same day.   Plan Managed by Dr. Veverly Fells.  Continue current medications  Vitamin D deficiency   Patient has failed these meds in  past: none Patient is currently controlled on the following medications:   Cholecalciferol (vitamin D) 2000 units, 1 tablet once daily   Vitamin D: 39.69 (10/07/2017)   Plan Continue current medications   OTC/ supplements  Patient is currently on the following medications:   Glucosamine 1500mg  complex, 2 tablets once daily   Multivitamin, 1 tablet once daily  Fluticasone (Flonase) nasal spray, 1 spray into both nostrils daily  Turmeric 800mg , 1 tablet once daily  Vitamin E 400 units, 1 capsule once daily   Medication Management  Patient organizes medications:  Patient states she turns bottles upside down when she takes them.  Primary pharmacist: patient uses [facility] mail order  Follow up Follow up visit with PharmD in 1 year. Will conduct general telephone calls for periodic check-ins before next visit.   Anson Crofts, PharmD Clinical Pharmacist [facility] [phone]

## 2019-08-12 NOTE — Patient Instructions (Addendum)
Visit Information  Goals Addressed            This Visit's Progress   . Pharmacy Care Plan       CARE PLAN ENTRY  Current Barriers:  . Chronic Disease Management support, education, and care coordination needs related to  high blood pressure, high cholesterol, hypothyroidism, arthritis, vitamin D deficiency  Controlled hight blood pressure per home blood pressure readings. . Current antihypertensive regimen: Amlodipine 5mg , 1 tablet once daily  . Previous antihypertensives tried: none . Last practice recorded BP readings:  BP Readings from Last 3 Encounters:  01/23/19 (!) 150/90  10/10/18 (!) 142/80  07/03/18 140/80 .  Current home BP readings:  120s-130s/76-80  Uncontrolled high cholesterol based on previous lab results . Current antihyperlipidemic regimen: lifestyle modifications . Previous antihyperlipidemic medications tried none . Most recent lipid panel:     Component Value Date/Time   CHOL 199 10/10/2018 0954   TRIG 68.0 10/10/2018 0954   HDL 61.00 10/10/2018 0954   CHOLHDL 3 10/10/2018 0954   VLDL 13.6 10/10/2018 0954   LDLCALC 125 (H) 10/10/2018 0954   LDLDIRECT 118.5 06/05/2006 1014 .  The 10-year ASCVD risk score [name], et al., 2013) is: 18.4%  Hypothyroid . Current regimen:  levothyroxine 128mcg, 1 tablet once daily  TSH  Date Value Ref Range Status  10/10/2018 3.97 0.35 - 4.50 uIU/mL Final  10/07/2017 3.41 0.35 - 4.50 uIU/mL Final  06/05/2016 1.13 0.35 - 4.50 uIU/mL Final   Arthritis/ pain  Current regimen:   Meloxicam (Mobic) 15mg , 1 tablet once daily   Tylenol arthritis, as needed  ibuprofen 200mg  as needed  Aspercreme 10% cream, apply as needed for muscle pain  Vitamin D deficiency  Current regimen: Cholecalciferol (vitamin D) 2000 units, 1 tablet once daily   Vitamin D level: 39.69 (10/07/2017)  Pharmacist Clinical Goal(s):  . High blood pressure:  Marland Kitchen Maintain Blood pressure <130/80 mmHg  . High cholesterol:  o Over the next 2  days, patient will work with PharmD and providers towards optimized antihyperlipidemic therapy if appropriate o At next physical, obtain fasting blood work (including cholesterol levels).  . Cholesterol goals: Total Cholesterol goal under 200, Triglycerides goal under 150, HDL goal above 40 (men) or above 50 (women), LDL goal under 100.  Marland Kitchen Hypothyroidism . Maintain TSH between 0.45 to 4.5uIU/ml. . Vitamin D deficiency:  . Maintain Vitamin D-25 level at or above 30.   Interventions: . Comprehensive medication review performed; medication list updated in electronic medical record.  Bertram Savin care team collaboration . High blood pressure . Discussed diet modifications. DASH diet:  following a diet emphasizing fruits and vegetables and low-fat dairy products along with whole grains, fish, poultry, and nuts. Reducing red meats and sugars.  . Exercising . Reducing the amount of salt intake to 1500mg /per day.  . Recommend using a salt substitute to replace your salt if you need flavor.    . Weight reduction- We discussed losing 5-10% of body weight . High cholesterol o We discussed how a diet high in plant sterols (fruits/vegetables/nuts/whole grains/legumes) may reduce your cholesterol.  Encouraged increasing fiber to a daily intake of 10-25g/day.   Patient Self Care Activities:  . Continue current medications as directed by providers.  . Continue following up with primary care provider and/or specialists. . Continue at home blood pressure readings. . Continue working on healthy habits (diet/ exercise).   Initial goal documentation        Meghan Dixon was given information  about Chronic Care Management services today including:  1. CCM service includes personalized support from designated clinical staff supervised by her physician, including individualized plan of care and coordination with other care providers 2. 24/7 contact phone numbers for assistance for urgent and routine  care needs. 3. Standard insurance, coinsurance, copays and deductibles apply for chronic care management only during months in which we provide at least 20 minutes of these services. Most insurances cover these services at 100%, however patients may be responsible for any copay, coinsurance and/or deductible if applicable. This service may help you avoid the need for more expensive face-to-face services. 4. Only one practitioner may furnish and bill the service in a calendar month. 5. The patient may stop CCM services at any time (effective at the end of the month) by phone call to the office staff.  Patient agreed to services and verbal consent obtained.   The patient verbalized understanding of instructions provided today and agreed to receive a mailed copy of patient instruction and/or educational materials. Telephone follow up appointment with pharmacy team member scheduled for: 08/03/2020  Anson Crofts, PharmD Clinical Pharmacist [facility] [phone]    [place] stands for "Dietary Approaches to Stop Hypertension." The DASH eating plan is a healthy eating plan that has been shown to reduce high blood pressure (hypertension). It may also reduce your risk for type 2 diabetes, heart disease, and stroke. The DASH eating plan may also help with weight loss. What are tips for following this plan?  General guidelines  Avoid eating more than 2,300 mg (milligrams) of salt (sodium) a day. If you have hypertension, you may need to reduce your sodium intake to 1,500 mg a day.  Limit alcohol intake to no more than 1 drink a day for nonpregnant women and 2 drinks a day for men. One drink equals 12 oz of beer, 5 oz of wine, or 1 oz of hard liquor.  Work with your health care provider to maintain a healthy body weight or to lose weight. Ask what an ideal weight is for you.  Get at least 30 minutes of exercise that causes your heart to beat faster  (aerobic exercise) most days of the week. Activities may include walking, swimming, or biking.  Work with your health care provider or diet and nutrition specialist (dietitian) to adjust your eating plan to your individual calorie needs. Reading food labels   Check food labels for the amount of sodium per serving. Choose foods with less than 5 percent of the Daily Value of sodium. Generally, foods with less than 300 mg of sodium per serving fit into this eating plan.  To find whole grains, look for the word "whole" as the first word in the ingredient list. Shopping  Buy products labeled as "low-sodium" or "no salt added."  Buy fresh foods. Avoid canned foods and premade or frozen meals. Cooking  Avoid adding salt when cooking. Use salt-free seasonings or herbs instead of table salt or sea salt. Check with your health care provider or pharmacist before using salt substitutes.  Do not fry foods. Cook foods using healthy methods such as baking, boiling, grilling, and broiling instead.  Cook with heart-healthy oils, such as olive, canola, soybean, or sunflower oil. Meal planning  Eat a balanced diet that includes: ? 5 or more servings of fruits and vegetables each day. At each meal, try to fill half of your plate with fruits and vegetables. ? Up to 6-8  servings of whole grains each day. ? Less than 6 oz of lean meat, poultry, or fish each day. A 3-oz serving of meat is about the same size as a deck of cards. One egg equals 1 oz. ? 2 servings of low-fat dairy each day. ? A serving of nuts, seeds, or beans 5 times each week. ? Heart-healthy fats. Healthy fats called Omega-3 fatty acids are found in foods such as flaxseeds and coldwater fish, like sardines, salmon, and mackerel.  Limit how much you eat of the following: ? Canned or prepackaged foods. ? Food that is high in trans fat, such as fried foods. ? Food that is high in saturated fat, such as fatty meat. ? Sweets, desserts, sugary  drinks, and other foods with added sugar. ? Full-fat dairy products.  Do not salt foods before eating.  Try to eat at least 2 vegetarian meals each week.  Eat more home-cooked food and less restaurant, buffet, and fast food.  When eating at a restaurant, ask that your food be prepared with less salt or no salt, if possible. What foods are recommended? The items listed may not be a complete list. Talk with your dietitian about what dietary choices are best for you. Grains Whole-grain or whole-wheat bread. Whole-grain or whole-wheat pasta. Brown rice. [place]. Bulgur. Whole-grain and low-sodium cereals. Pita bread. Low-fat, low-sodium crackers. Whole-wheat flour tortillas. Vegetables Fresh or frozen vegetables (raw, steamed, roasted, or grilled). Low-sodium or reduced-sodium tomato and vegetable juice. Low-sodium or reduced-sodium tomato sauce and tomato paste. Low-sodium or reduced-sodium canned vegetables. Fruits All fresh, dried, or frozen fruit. Canned fruit in natural juice (without added sugar). Meat and other protein foods Skinless chicken or [place]. Ground chicken or [place]. Pork with fat trimmed off. Fish and seafood. Egg whites. Dried beans, peas, or lentils. Unsalted nuts, nut butters, and seeds. Unsalted canned beans. Lean cuts of beef with fat trimmed off. Low-sodium, lean deli meat. Dairy Low-fat (1%) or fat-free (skim) milk. Fat-free, low-fat, or reduced-fat cheeses. Nonfat, low-sodium ricotta or cottage cheese. Low-fat or nonfat yogurt. Low-fat, low-sodium cheese. Fats and oils Soft margarine without trans fats. Vegetable oil. Low-fat, reduced-fat, or light mayonnaise and salad dressings (reduced-sodium). Canola, safflower, olive, soybean, and sunflower oils. Avocado. Seasoning and other foods Herbs. Spices. Seasoning mixes without salt. Unsalted popcorn and pretzels. Fat-free sweets. What foods are not recommended? The items listed may not be a complete list. Talk  with your dietitian about what dietary choices are best for you. Grains Baked goods made with fat, such as croissants, muffins, or some breads. Dry pasta or rice meal packs. Vegetables Creamed or fried vegetables. Vegetables in a cheese sauce. Regular canned vegetables (not low-sodium or reduced-sodium). Regular canned tomato sauce and paste (not low-sodium or reduced-sodium). Regular tomato and vegetable juice (not low-sodium or reduced-sodium). Angie Fava. Olives. Fruits Canned fruit in a light or heavy syrup. Fried fruit. Fruit in cream or butter sauce. Meat and other protein foods Fatty cuts of meat. Ribs. Fried meat. Berniece Salines. Sausage. Bologna and other processed lunch meats. Salami. Fatback. Hotdogs. Bratwurst. Salted nuts and seeds. Canned beans with added salt. Canned or smoked fish. Whole eggs or egg yolks. Chicken or [place] with skin. Dairy Whole or 2% milk, cream, and half-and-half. Whole or full-fat cream cheese. Whole-fat or sweetened yogurt. Full-fat cheese. Nondairy creamers. Whipped toppings. Processed cheese and cheese spreads. Fats and oils Butter. Stick margarine. Lard. Shortening. Ghee. Bacon fat. Tropical oils, such as coconut, palm kernel, or palm oil. Seasoning and other  foods Salted popcorn and pretzels. Onion salt, garlic salt, seasoned salt, table salt, and sea salt. Worcestershire sauce. Tartar sauce. Barbecue sauce. Teriyaki sauce. Soy sauce, including reduced-sodium. Steak sauce. Canned and packaged gravies. Fish sauce. Oyster sauce. Cocktail sauce. Horseradish that you find on the shelf. Ketchup. Mustard. Meat flavorings and tenderizers. Bouillon cubes. Hot sauce and Tabasco sauce. Premade or packaged marinades. Premade or packaged taco seasonings. Relishes. Regular salad dressings. Where to find more information:  National Heart, Lung, and [place]: https://wilson-eaton.com/  American Heart Association: www.heart.org Summary  The DASH eating plan is a healthy eating plan  that has been shown to reduce high blood pressure (hypertension). It may also reduce your risk for type 2 diabetes, heart disease, and stroke.  With the DASH eating plan, you should limit salt (sodium) intake to 2,300 mg a day. If you have hypertension, you may need to reduce your sodium intake to 1,500 mg a day.  When on the DASH eating plan, aim to eat more fresh fruits and vegetables, whole grains, lean proteins, low-fat dairy, and heart-healthy fats.  Work with your health care provider or diet and nutrition specialist (dietitian) to adjust your eating plan to your individual calorie needs. This information is not intended to replace advice given to you by your health care provider. Make sure you discuss any questions you have with your health care provider. Document Revised: 03/15/2017 Document Reviewed: 03/26/2016 Elsevier Patient Education  2020 [employer].

## 2019-08-14 IMAGING — CR DG CERVICAL SPINE COMPLETE 4+V
1 series · 5 of 5 positions shown · non-contrast
Comparison: None.

CLINICAL DATA: Tightness and stiffness in the neck and both
shoulders following a fall yesterday.

EXAM:
CERVICAL SPINE - COMPLETE 4+ VIEW

[Series 1: dg cervical spine complete · 0.14mm/px · 5 of 5 slices shown]
[im 1/5]
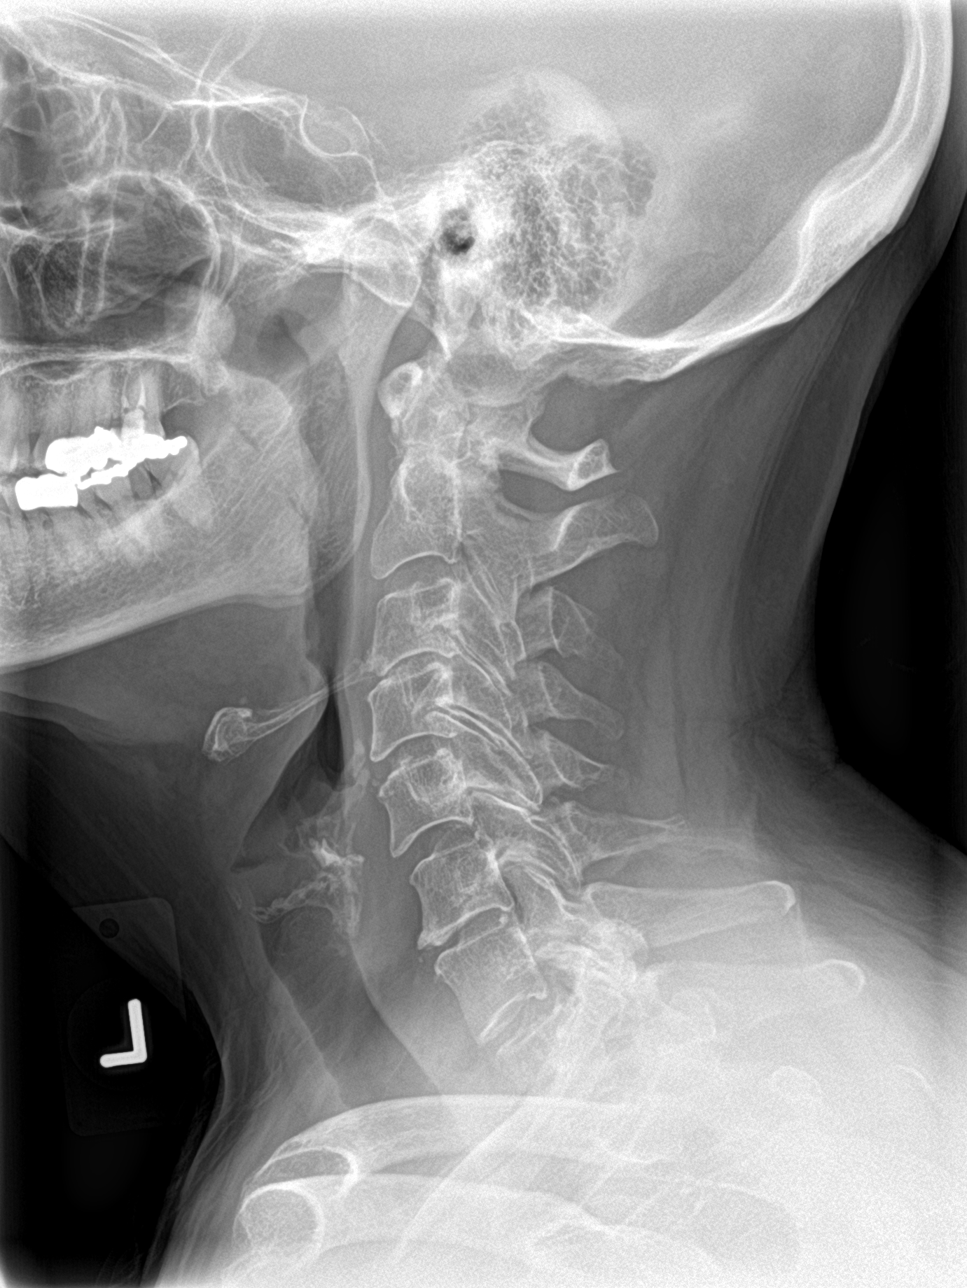
[im 2/5]
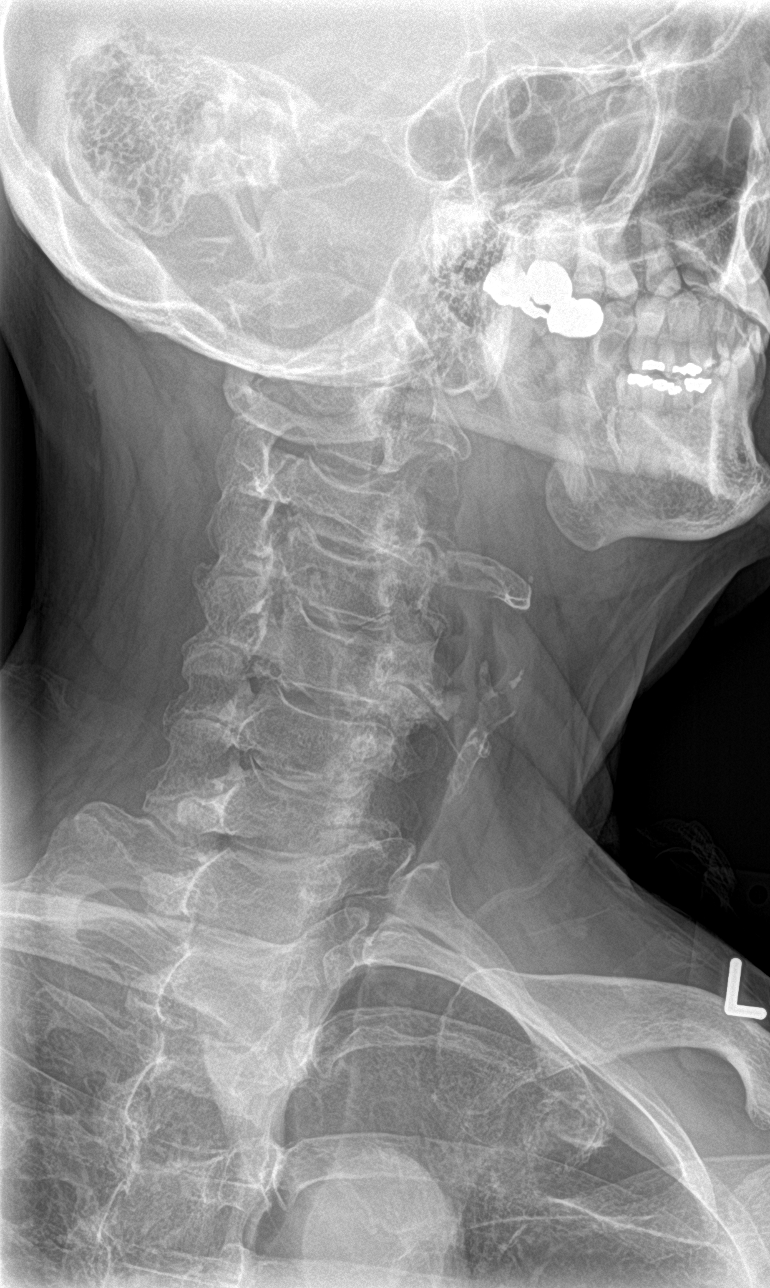
[im 3/5]
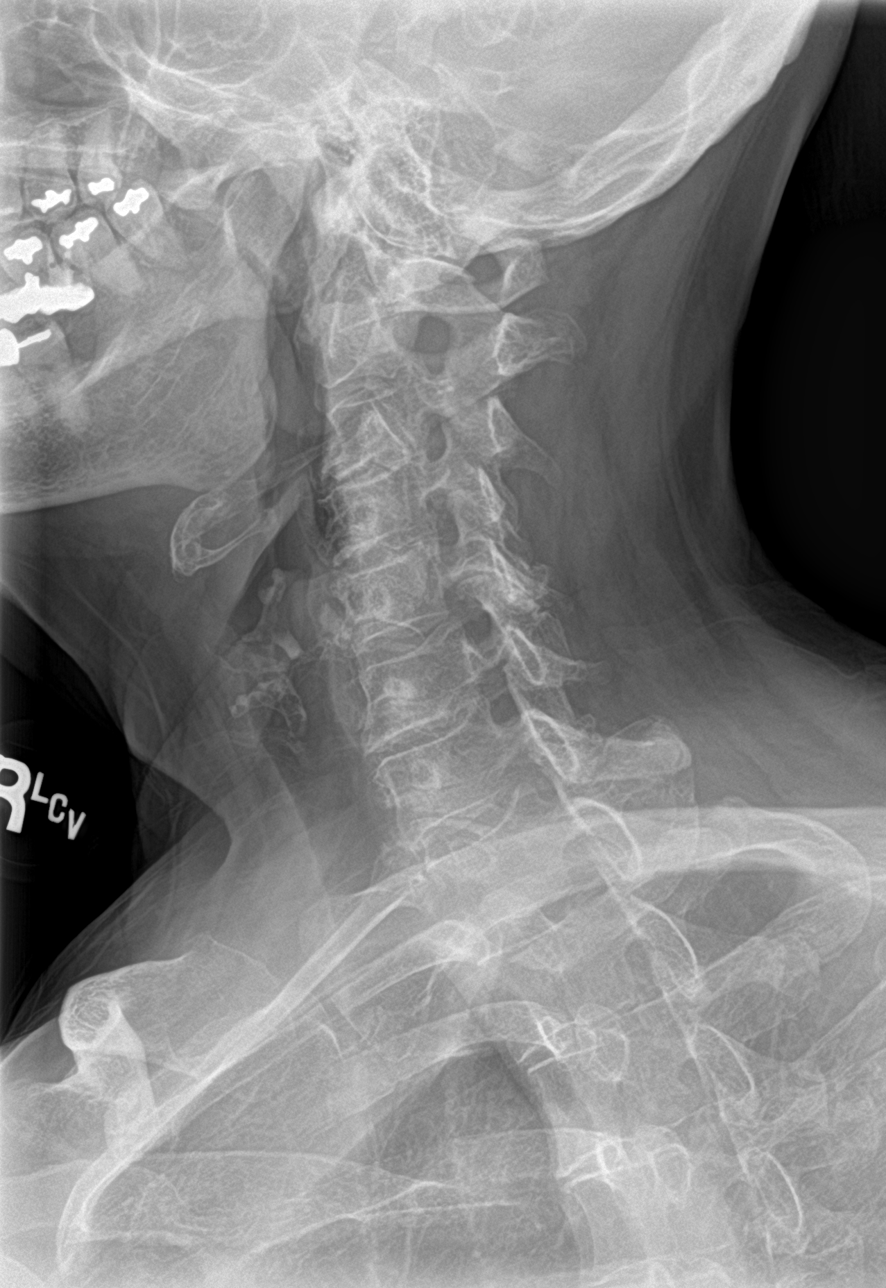
[im 4/5]
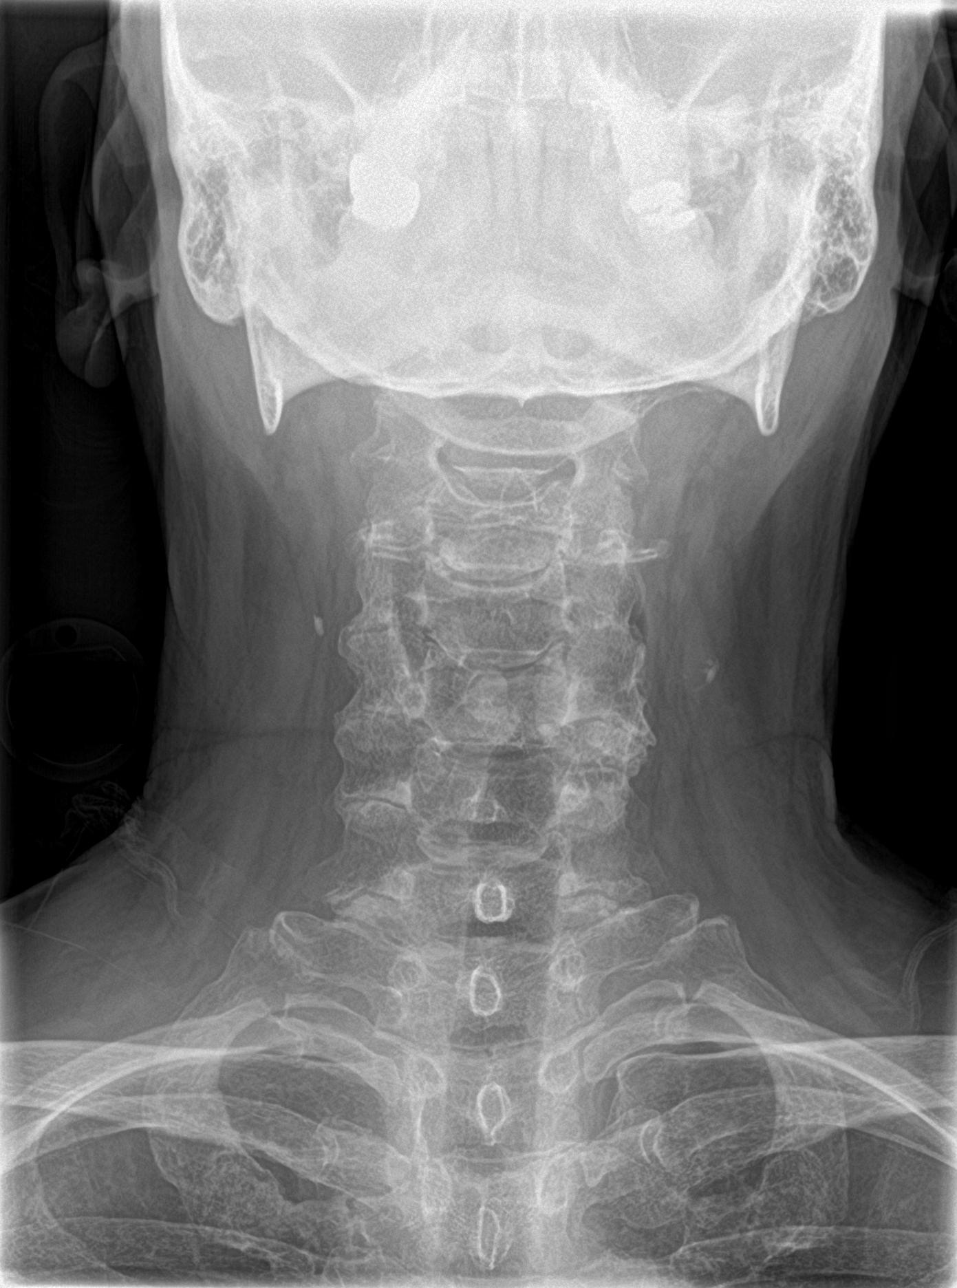
[im 5/5]
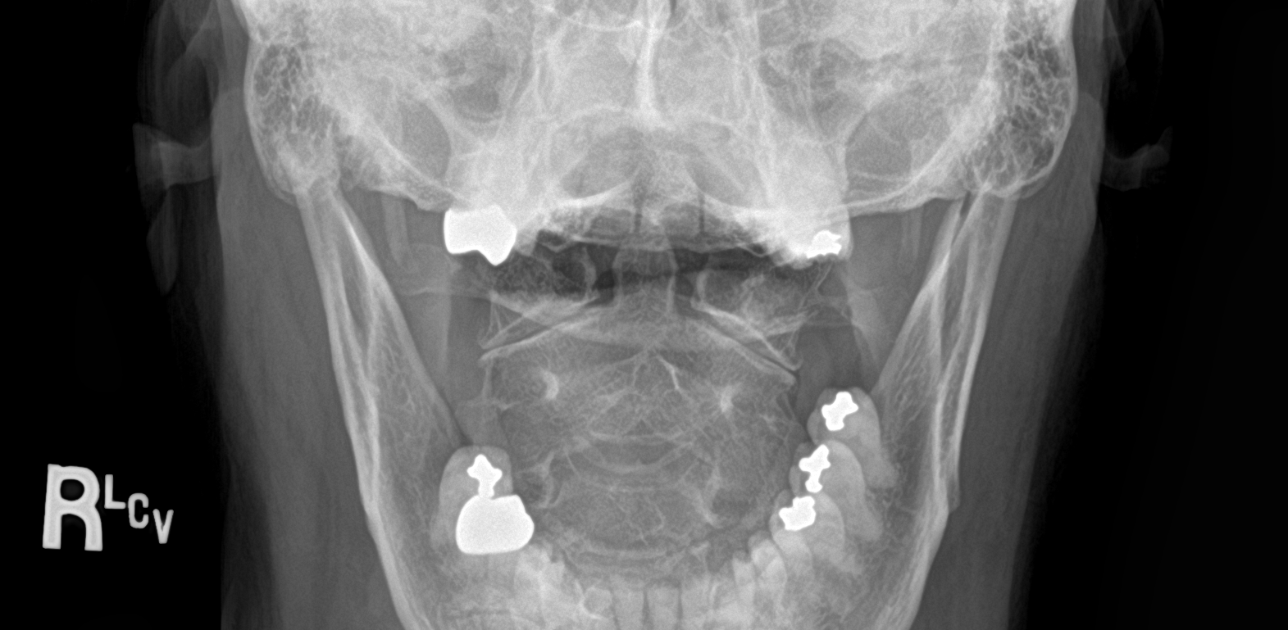

[5 of 5 positions shown; findings below may reference images not displayed]

FINDINGS: Moderate to marked disc space narrowing mild anterior posterior spur
formation and mild retrolisthesis at the C3-4 level. Minimal
anterolisthesis at the C5-6 level. Facet degenerative changes
throughout the cervical spine. Moderate anterior spur formation with
disc space narrowing at the C6-7 level and mild anterior spur
formation at the C5-6 level. The neural foramina on the right are
difficult to assess due to inadequate obliquity. There is mild
foraminal stenosis on the left at the C3-4 and C4-5 levels. No
fractures are seen. Mild bilateral carotid artery calcifications are
noted.
IMPRESSION: 1. Multilevel degenerative changes with associated mild
subluxations, as described above.
2. No cervical spine fracture.
3. Mild bilateral carotid artery atheromatous calcifications.

## 2019-09-23 ENCOUNTER — Encounter: Payer: Self-pay | Admitting: Family Medicine

## 2019-09-23 ENCOUNTER — Other Ambulatory Visit: Payer: Self-pay

## 2019-09-23 ENCOUNTER — Ambulatory Visit (INDEPENDENT_AMBULATORY_CARE_PROVIDER_SITE_OTHER): Payer: Medicare HMO | Admitting: Family Medicine

## 2019-09-23 VITALS — BP 140/82 | HR 82 | Temp 98.0°F | Wt 176.2 lb

## 2019-09-23 DIAGNOSIS — J019 Acute sinusitis, unspecified: Secondary | ICD-10-CM | POA: Diagnosis not present

## 2019-09-23 MED ORDER — LEVOFLOXACIN 500 MG PO TABS
500.0000 mg | ORAL_TABLET | Freq: Every day | ORAL | 0 refills | Status: AC
Start: 2019-09-23 — End: 2019-10-03

## 2019-09-23 NOTE — Progress Notes (Signed)
   Subjective:    Patient ID: Meghan Dixon, female    DOB: 1945/05/22, 74 y.o.   MRN: [id number]  HPI Here for 3 days of sinus pressure, PND, and a ST. No cough or SOB. No fever or body aches. No NVD. Taking Claritin and Mucinex.    Review of Systems  Constitutional: Negative.   HENT: Positive for congestion, postnasal drip and sinus pressure. Negative for sore throat.   Eyes: Negative.   Respiratory: Negative.        Objective:   Physical Exam        Assessment & Plan:  Sinusitis, treat with Levaquin. [name], MD

## 2019-10-14 ENCOUNTER — Other Ambulatory Visit: Payer: Self-pay

## 2019-10-14 ENCOUNTER — Ambulatory Visit (INDEPENDENT_AMBULATORY_CARE_PROVIDER_SITE_OTHER): Payer: Medicare HMO | Admitting: Family Medicine

## 2019-10-14 ENCOUNTER — Encounter: Payer: Self-pay | Admitting: Family Medicine

## 2019-10-14 VITALS — BP 138/72 | HR 74 | Temp 97.8°F | Ht 65.0 in | Wt 177.2 lb

## 2019-10-14 DIAGNOSIS — Z0000 Encounter for general adult medical examination without abnormal findings: Secondary | ICD-10-CM | POA: Diagnosis not present

## 2019-10-14 DIAGNOSIS — E039 Hypothyroidism, unspecified: Secondary | ICD-10-CM | POA: Diagnosis not present

## 2019-10-14 MED ORDER — LEVOTHYROXINE SODIUM 112 MCG PO TABS: 112.0000 ug | ORAL_TABLET | Freq: Every day | ORAL | 3 refills | Status: DC

## 2019-10-14 MED ORDER — AMLODIPINE BESYLATE 5 MG PO TABS: 5.0000 mg | ORAL_TABLET | Freq: Every day | ORAL | 3 refills | Status: DC

## 2019-10-14 NOTE — Progress Notes (Signed)
   Subjective:    Patient ID: Meghan Dixon, female    DOB: 12-26-1945, 74 y.o.   MRN: [id number]  HPI Here for a well exam. She feels well. She recovered from a recent sinusitis.      Review of Systems  Constitutional: Negative.   HENT: Negative.   Eyes: Negative.   Respiratory: Negative.   Cardiovascular: Negative.   Gastrointestinal: Negative.   Genitourinary: Negative for decreased urine volume, difficulty urinating, dyspareunia, dysuria, enuresis, flank pain, frequency, hematuria, pelvic pain and urgency.  Musculoskeletal: Negative.   Skin: Negative.   Neurological: Negative.   Psychiatric/Behavioral: Negative.        Objective:   Physical Exam Constitutional:      General: She is not in acute distress.    Appearance: She is well-developed.  HENT:     Head: Normocephalic and atraumatic.     Right Ear: External ear normal.     Left Ear: External ear normal.     Nose: Nose normal.     Mouth/Throat:     Pharynx: No oropharyngeal exudate.  Eyes:     General: No scleral icterus.    Conjunctiva/sclera: Conjunctivae normal.     Pupils: Pupils are equal, round, and reactive to light.  Neck:     Thyroid: No thyromegaly.     Vascular: No JVD.  Cardiovascular:     Rate and Rhythm: Normal rate and regular rhythm.     Heart sounds: Normal heart sounds. No murmur heard.  No friction rub. No gallop.   Pulmonary:     Effort: Pulmonary effort is normal. No respiratory distress.     Breath sounds: Normal breath sounds. No wheezing or rales.  Chest:     Chest wall: No tenderness.  Abdominal:     General: Bowel sounds are normal. There is no distension.     Palpations: Abdomen is soft. There is no mass.     Tenderness: There is no abdominal tenderness. There is no guarding or rebound.  Musculoskeletal:        General: No tenderness. Normal range of motion.     Cervical back: Normal range of motion and neck supple.  Lymphadenopathy:     Cervical: No cervical adenopathy.    Skin:    General: Skin is warm and dry.     Findings: No erythema or rash.  Neurological:     Mental Status: She is alert and oriented to person, place, and time.     Cranial Nerves: No cranial nerve deficit.     Motor: No abnormal muscle tone.     Coordination: Coordination normal.     Deep Tendon Reflexes: Reflexes are normal and symmetric. Reflexes normal.  Psychiatric:        Behavior: Behavior normal.        Thought Content: Thought content normal.        Judgment: Judgment normal.           Assessment & Plan:  Well exam. We discussed diet and exercise. Get fasting labs.  [name], MD

## 2019-10-23 ENCOUNTER — Other Ambulatory Visit (INDEPENDENT_AMBULATORY_CARE_PROVIDER_SITE_OTHER): Payer: Medicare HMO

## 2019-10-23 ENCOUNTER — Other Ambulatory Visit: Payer: Self-pay

## 2019-10-23 DIAGNOSIS — Z0000 Encounter for general adult medical examination without abnormal findings: Secondary | ICD-10-CM | POA: Diagnosis not present

## 2019-10-23 DIAGNOSIS — E039 Hypothyroidism, unspecified: Secondary | ICD-10-CM

## 2019-10-23 NOTE — Addendum Note (Signed)
Addended by: Marrion Coy on: 10/23/2019 09:38 AM   Modules accepted: Orders

## 2019-10-24 LAB — CBC WITH DIFFERENTIAL/PLATELET
Absolute Monocytes: 519 cells/uL (ref 200–950)
Basophils Absolute: 40 cells/uL (ref 0–200)
Basophils Relative: 0.7 %
Eosinophils Absolute: 188 cells/uL (ref 15–500)
Eosinophils Relative: 3.3 %
HCT: 42.2 % (ref 35.0–45.0)
Hemoglobin: 13.7 g/dL (ref 11.7–15.5)
Lymphs Abs: 1271 cells/uL (ref 850–3900)
MCH: 29.7 pg (ref 27.0–33.0)
MCHC: 32.5 g/dL (ref 32.0–36.0)
MCV: 91.3 fL (ref 80.0–100.0)
MPV: 9.9 fL (ref 7.5–12.5)
Monocytes Relative: 9.1 %
Neutro Abs: 3682 cells/uL (ref 1500–7800)
Neutrophils Relative %: 64.6 %
Platelets: 243 10*3/uL (ref 140–400)
RBC: 4.62 10*6/uL (ref 3.80–5.10)
RDW: 12.3 % (ref 11.0–15.0)
Total Lymphocyte: 22.3 %
WBC: 5.7 10*3/uL (ref 3.8–10.8)

## 2019-10-24 LAB — BASIC METABOLIC PANEL
BUN: 17 mg/dL (ref 7–25)
CO2: 29 mmol/L (ref 20–32)
Calcium: 9.2 mg/dL (ref 8.6–10.4)
Chloride: 104 mmol/L (ref 98–110)
Creat: 0.72 mg/dL (ref 0.60–0.93)
Glucose, Bld: 98 mg/dL (ref 65–99)
Potassium: 4.5 mmol/L (ref 3.5–5.3)
Sodium: 141 mmol/L (ref 135–146)

## 2019-10-24 LAB — HEPATIC FUNCTION PANEL
AG Ratio: 1.8 (calc) (ref 1.0–2.5)
ALT: 15 U/L (ref 6–29)
AST: 19 U/L (ref 10–35)
Albumin: 4.4 g/dL (ref 3.6–5.1)
Alkaline phosphatase (APISO): 64 U/L (ref 37–153)
Bilirubin, Direct: 0.1 mg/dL (ref 0.0–0.2)
Globulin: 2.4 g/dL (calc) (ref 1.9–3.7)
Indirect Bilirubin: 0.4 mg/dL (calc) (ref 0.2–1.2)
Total Bilirubin: 0.5 mg/dL (ref 0.2–1.2)
Total Protein: 6.8 g/dL (ref 6.1–8.1)

## 2019-10-24 LAB — LIPID PANEL
Cholesterol: 201 mg/dL — ABNORMAL HIGH (ref <200)
HDL: 58 mg/dL (ref >=50)
LDL Cholesterol (Calc): 123 mg/dL (calc) — ABNORMAL HIGH
Non-HDL Cholesterol (Calc): 143 mg/dL (calc) — ABNORMAL HIGH (ref <130)
Total CHOL/HDL Ratio: 3.5 (calc) (ref <5.0)
Triglycerides: 101 mg/dL (ref <150)

## 2019-10-24 LAB — T4, FREE: Free T4: 1.3 ng/dL (ref 0.8–1.8)

## 2019-10-24 LAB — TSH: TSH: 6.9 mIU/L — ABNORMAL HIGH (ref 0.40–4.50)

## 2019-10-24 LAB — T3, FREE: T3, Free: 3.1 pg/mL (ref 2.3–4.2)

## 2019-12-16 DIAGNOSIS — U071 COVID-19: Secondary | ICD-10-CM

## 2019-12-16 HISTORY — DX: COVID-19: U07.1

## 2019-12-29 DIAGNOSIS — Z20828 Contact with and (suspected) exposure to other viral communicable diseases: Secondary | ICD-10-CM | POA: Diagnosis not present

## 2020-01-01 DIAGNOSIS — J019 Acute sinusitis, unspecified: Secondary | ICD-10-CM | POA: Diagnosis not present

## 2020-01-01 DIAGNOSIS — U071 COVID-19: Secondary | ICD-10-CM | POA: Diagnosis not present

## 2020-01-11 ENCOUNTER — Other Ambulatory Visit: Payer: Self-pay | Admitting: Family Medicine

## 2020-01-11 DIAGNOSIS — Z1231 Encounter for screening mammogram for malignant neoplasm of breast: Secondary | ICD-10-CM

## 2020-02-02 ENCOUNTER — Ambulatory Visit (INDEPENDENT_AMBULATORY_CARE_PROVIDER_SITE_OTHER): Payer: Medicare HMO | Admitting: Family Medicine

## 2020-02-02 ENCOUNTER — Other Ambulatory Visit: Payer: Self-pay

## 2020-02-02 ENCOUNTER — Encounter: Payer: Self-pay | Admitting: Family Medicine

## 2020-02-02 VITALS — BP 140/80 | HR 95 | Temp 98.4°F | Ht 65.0 in | Wt 174.2 lb

## 2020-02-02 DIAGNOSIS — Z23 Encounter for immunization: Secondary | ICD-10-CM

## 2020-02-02 DIAGNOSIS — M542 Cervicalgia: Secondary | ICD-10-CM | POA: Diagnosis not present

## 2020-02-02 MED ORDER — CYCLOBENZAPRINE HCL 10 MG PO TABS: 10.0000 mg | ORAL_TABLET | Freq: Three times a day (TID) | ORAL | 3 refills | Status: DC | PRN

## 2020-02-02 MED ORDER — MELOXICAM 15 MG PO TABS: 15.0000 mg | ORAL_TABLET | Freq: Every day | ORAL | 3 refills | Status: DC

## 2020-02-02 NOTE — Progress Notes (Signed)
   Subjective:    Patient ID: Meghan Dixon, female    DOB: 1945/10/11, 74 y.o.   MRN: [id number]  HPI Here for intermittent pain in the right side of the neck. This started several years ago. She has now had it for the past 2 days. Xrays of her cervical spine in 2019 revealed diffuse degenerative disease and numerous spurs. The pain will radiate up the right side of her neck and over to the right shoulder. She takes Meloxicam as needed (typically once or twice a week).    Review of Systems  Constitutional: Negative.   Respiratory: Negative.   Cardiovascular: Negative.   Musculoskeletal: Positive for neck pain and neck stiffness.       Objective:   Physical Exam Constitutional:      Appearance: Normal appearance.  Cardiovascular:     Rate and Rhythm: Normal rate and regular rhythm.     Pulses: Normal pulses.     Heart sounds: Normal heart sounds.  Pulmonary:     Effort: Pulmonary effort is normal.     Breath sounds: Normal breath sounds.  Musculoskeletal:     Comments: She is tender along the right side of the neck, along the upper right trapezius muscle, and over the lower cervical spine. ROM is somewhat limited by pain   Neurological:     Mental Status: She is alert.           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 She will start taking Meloxicam every day to reduce inflammation. Then when she gets a flare of this she will add Flexeril and apply moist heat. Recheck prn.  [name], MD

## 2020-02-02 NOTE — Addendum Note (Signed)
Addended by: Matilde Sprang on: 02/02/2020 05:12 PM   Modules accepted: Orders

## 2020-02-15 ENCOUNTER — Ambulatory Visit (INDEPENDENT_AMBULATORY_CARE_PROVIDER_SITE_OTHER): Payer: Medicare HMO | Admitting: Internal Medicine

## 2020-02-15 ENCOUNTER — Other Ambulatory Visit: Payer: Self-pay

## 2020-02-15 ENCOUNTER — Encounter: Payer: Self-pay | Admitting: Internal Medicine

## 2020-02-15 VITALS — BP 148/80 | HR 94 | Temp 98.1°F | Ht 65.0 in | Wt 175.0 lb

## 2020-02-15 DIAGNOSIS — R39198 Other difficulties with micturition: Secondary | ICD-10-CM | POA: Diagnosis not present

## 2020-02-15 DIAGNOSIS — Z0000 Encounter for general adult medical examination without abnormal findings: Secondary | ICD-10-CM

## 2020-02-15 LAB — POCT URINALYSIS DIP (CLINITEK)
Bilirubin, UA: NEGATIVE
Blood, UA: NEGATIVE
Glucose, UA: NEGATIVE mg/dL
Ketones, POC UA: NEGATIVE mg/dL
Leukocytes, UA: NEGATIVE
Nitrite, UA: NEGATIVE
POC PROTEIN,UA: NEGATIVE
Spec Grav, UA: 1.015 (ref 1.010–1.025)
Urobilinogen, UA: 0.2 E.U./dL
pH, UA: 6 (ref 5.0–8.0)

## 2020-02-15 NOTE — Progress Notes (Signed)
Chief Complaint  Patient presents with   Follow-up    having difficulty urinating, started one week ago    HPI: [facility] A Imran 74 y.o. come in for sda PCP appt NA Last seen  11 19 per pcp for neck pain  Given meloxicam  And as needed flexeril she had noticed over the last week changes in her stream or ability to expel urine.  She states it came out slow but no associated serious pain dysuria fever. She stopped her meloxicam and Flexeril in case she felt it was a med side effect for her low back pain.  Her pain is not radiating she has no fever and chills. No hematuria.  After having some caffeine coffee she was able to void normally.  Like being on stopped.   Tried stopping    Tylenol pm and  Muscle relaxant .   Non fever   Taking stool softtener for previous constipation but doing well she got this from her insurance company. For days of sx .   Remote hx of cough med causing similar symptoms but resolved with stopping the medicine. ROS: See pertinent positives and negatives per HPI.  No fever no GYN problems  Past Medical History:  Diagnosis Date   Allergy    allergic rhinitis   Arthritis    foot   COVID-19 virus infection 12/2019   HSV infection    Hypertension    Hyperthyroidism    Osteopenia     Family History  Problem Relation Age of Onset   Heart disease Mother    Cancer Father        esophageal CA   Alcohol abuse Father    Arthritis Sister        RA   Heart disease Brother        CAD   Colon cancer Brother 47   Lung cancer Brother    Breast cancer Cousin    Stomach cancer Neg Hx     Social History   Socioeconomic History   Marital status: Married    Spouse name: Not on file   Number of children: 1   Years of education: 10   Highest education level: Not on file  Occupational History   Occupation: Cytogeneticist: [employer]  Tobacco Use   Smoking status: Never Smoker   Smokeless tobacco: Never Used  IT trainer Use: Never used  Substance and Sexual Activity   Alcohol use: No    Alcohol/week: 0.0 standard drinks   Drug use: No   Sexual activity: Yes    Birth control/protection: Post-menopausal  Other Topics Concern   Not on file  Social History Narrative   Drinks about 4 caffeinated drinks a day   Social Determinants of Radio broadcast assistant Strain: Low Risk    Difficulty of Paying Living Expenses: Not hard at all  Food Insecurity:    Worried About Charity fundraiser in the Last Year: Not on file   [employer] of Food in the Last Year: Not on file  Transportation Needs: No Transportation Needs   Lack of Transportation (Medical): No   Lack of Transportation (Non-Medical): No  Physical Activity:    Days of Exercise per Week: Not on file   Minutes of Exercise per Session: Not on file  Stress:    Feeling of Stress : Not on file  Social Connections:    Frequency of Communication with Friends and Family:  Not on file   Frequency of Social Gatherings with Friends and Family: Not on file   Attends Religious Services: Not on file   Active Member of Clubs or Organizations: Not on file   Attends Archivist Meetings: Not on file   Marital Status: Not on file    Outpatient Medications Prior to Visit  Medication Sig Dispense Refill   acetaminophen (TYLENOL) 650 MG CR tablet Take 650 mg by mouth every 8 (eight) hours as needed for pain.     amLODipine (NORVASC) 5 MG tablet Take 1 tablet (5 mg total) by mouth daily. 90 tablet 3   Cholecalciferol (VITAMIN D) 2000 units tablet Take 2,000 Units by mouth daily. 40,000 units daily (takes 2 pills of 20,000 units)     cyclobenzaprine (FLEXERIL) 10 MG tablet Take 1 tablet (10 mg total) by mouth 3 (three) times daily as needed for muscle spasms. 180 tablet 3   fluticasone (FLONASE) 50 MCG/ACT nasal spray Place 1 spray into both nostrils daily.     Glucosamine-Chondroit-Vit C-Mn (GLUCOSAMINE 1500 COMPLEX  PO) Take 2 tablets by mouth once.      ibuprofen (ADVIL) 200 MG tablet Take 200 mg by mouth as needed.     levothyroxine (SYNTHROID) 112 MCG tablet Take 1 tablet (112 mcg total) by mouth daily. 90 tablet 3   loratadine (CLARITIN) 10 MG tablet Take 10 mg by mouth daily.     meclizine (ANTIVERT) 25 MG tablet Take 1 tablet (25 mg total) by mouth every 4 (four) hours as needed for dizziness. 60 tablet 2   meloxicam (MOBIC) 15 MG tablet Take 1 tablet (15 mg total) by mouth daily. 90 tablet 3   Multiple Vitamin (MULTIVITAMIN) capsule Take 1 capsule by mouth daily.       trolamine salicylate (ASPERCREME) 10 % cream Apply 1 application topically as needed for muscle pain.     TURMERIC PO Take 800 mg by mouth daily.     valACYclovir (VALTREX) 1000 MG tablet Take 1 tablet (1,000 mg total) by mouth daily. Take for 5 days. 30 tablet 3   vitamin E (VITAMIN E) 180 MG (400 UNITS) capsule Take 400 Units by mouth daily.     Zinc 100 MG TABS Take 1 tablet by mouth daily.     No facility-administered medications prior to visit.     EXAM:  BP (!) 148/80 (BP Location: Right Arm, Patient Position: Sitting, Cuff Size: Normal)    Pulse 94    Temp 98.1 F (36.7 C) (Oral)    Ht 5\' 5"  (1.651 m)    Wt 175 lb (79.4 kg)    SpO2 99%    BMI 29.12 kg/m   Body mass index is 29.12 kg/m.  GENERAL: vitals reviewed and listed above, alert, oriented, appears well hydrated and in no acute distress HEENT: atraumatic, conjunctiva  clear, no obvious abnormalities on inspection of external nose and ears OP : masked  NECK: no obvious masses on inspection palpation  LUNGS: clear to auscultation bilaterally, no wheezes, rales or rhonchi, good air movement CV: HRRR, no clubbing cyanosis or  peripheral edema nl cap refill  Abdomen:  Sof,t normal bowel sounds without hepatosplenomegaly, no guarding rebound or masses no CVA tenderness MS: moves all extremities without noticeable focal  abnormality PSYCH: pleasant and  cooperative, no obvious depression or anxiety  BP Readings from Last 3 Encounters:  02/15/20 (!) 148/80  02/02/20 140/80  10/14/19 138/72   Urinalysis    Component Value Date/Time  COLORURINE yellow 11/05/2008 1208   APPEARANCEUR Clear 11/05/2008 1208   LABSPEC <1.005 11/05/2008 1208   PHURINE 6.0 11/05/2008 1208   HGBUR trace-intact 11/05/2008 1208   BILIRUBINUR negative 02/15/2020 1353   BILIRUBINUR n 10/10/2018 1102   KETONESUR negative 02/15/2020 1353   PROTEINUR Negative 10/10/2018 1102   UROBILINOGEN 0.2 02/15/2020 1353   UROBILINOGEN 0.2 11/05/2008 1208   NITRITE Negative 02/15/2020 1353   NITRITE n 10/10/2018 1102   NITRITE negative 11/05/2008 1208   LEUKOCYTESUR Negative 02/15/2020 1353     ASSESSMENT AND PLAN:  Discussed the following assessment and plan:  Difficulty in voiding - Plan: CULTURE, URINE COMPREHENSIVE, CULTURE, URINE COMPREHENSIVE  Preventative health care - Plan: POCT URINALYSIS DIP (CLINITEK) Uncertain cause of symptoms sounds like bladder dysfunction but not acutely neurologic alarming.  Doubt infection but check for this today.  Discussed medicine side effects potential.  She may make an appointment with urologist that her husband sees and she has seen years ago.  If ongoing symptoms that would be the next step. Add MiraLAX to optimize her constipation treatment which she states has not been a problem recently avoid certain medicine she can try Azo just to see if it helps her symptoms but would not cure thing if something is progressive neurologically fever other get back with [place] earlier. -Patient advised to return or notify health care team  if  new concerns arise. 32 minutes counsel  Review and advise and plans  Patient Instructions  Culture urine  And  Avoid bladder irritants and cold  Cough meds and decongestants.   Take miralax every day for 3-5 days and then as needed for good clean out incase bowels are Affecting  Your urine output.    Stay hydrated .  Warm baths if needed . considier voiding diary.      Standley Brooking. Dheeraj Hail M.D.

## 2020-02-15 NOTE — Patient Instructions (Addendum)
Culture urine  And  Avoid bladder irritants and cold  Cough meds and decongestants.   Take miralax every day for 3-5 days and then as needed for good clean out incase bowels are Affecting  Your urine output.   Stay hydrated .  Warm baths if needed . considier voiding diary.

## 2020-02-16 ENCOUNTER — Ambulatory Visit
Admission: RE | Admit: 2020-02-16 | Discharge: 2020-02-16 | Disposition: A | Discharge status: Home / Self Care | Payer: Medicare HMO | Source: Ambulatory Visit | Attending: Family Medicine | Admitting: Family Medicine

## 2020-02-16 DIAGNOSIS — Z1231 Encounter for screening mammogram for malignant neoplasm of breast: Secondary | ICD-10-CM

## 2020-02-17 LAB — CULTURE, URINE COMPREHENSIVE
MICRO NUMBER:: 11143228
RESULT:: NO GROWTH
SPECIMEN QUALITY:: ADEQUATE

## 2020-02-18 NOTE — Progress Notes (Signed)
Urine culture shows no bacteria . No   UTI infection Fu urology and or pcp with  sx

## 2020-04-11 DIAGNOSIS — L820 Inflamed seborrheic keratosis: Secondary | ICD-10-CM | POA: Diagnosis not present

## 2020-04-11 DIAGNOSIS — L308 Other specified dermatitis: Secondary | ICD-10-CM | POA: Diagnosis not present

## 2020-04-11 DIAGNOSIS — D225 Melanocytic nevi of trunk: Secondary | ICD-10-CM | POA: Diagnosis not present

## 2020-04-11 DIAGNOSIS — D181 Lymphangioma, any site: Secondary | ICD-10-CM | POA: Diagnosis not present

## 2020-04-11 DIAGNOSIS — Z1283 Encounter for screening for malignant neoplasm of skin: Secondary | ICD-10-CM | POA: Diagnosis not present

## 2020-04-11 DIAGNOSIS — L650 Telogen effluvium: Secondary | ICD-10-CM | POA: Diagnosis not present

## 2020-05-25 DIAGNOSIS — R3911 Hesitancy of micturition: Secondary | ICD-10-CM | POA: Diagnosis not present

## 2020-06-02 ENCOUNTER — Telehealth: Payer: Self-pay | Admitting: Pharmacist

## 2020-06-02 NOTE — Chronic Care Management (AMB) (Signed)
Chronic Care Management Pharmacy Assistant   Name: Cris Gibby [place]  MRN: [id number] DOB: [birth year]  Reason for Encounter: General Adherence Call  PCP : [name], MD  Allergies:   Allergies  Allergen Reactions  . Augmentin [Amoxicillin-Pot Clavulanate] Other (See Comments)    Extreme heartburn  . Beta Adrenergic Blockers     REACTION: reaction not known  . Cefuroxime Axetil Hives    Throat closes  . Cetirizine Hcl     REACTION: dizzy  . Fexofenadine     REACTION: dizzy  . Prednisone     Mood changer with higher doses     Medications: Outpatient Encounter Medications as of 06/02/2020  Medication Sig  . acetaminophen (TYLENOL) 650 MG CR tablet Take 650 mg by mouth every 8 (eight) hours as needed for pain.  Marland Kitchen amLODipine (NORVASC) 5 MG tablet Take 1 tablet (5 mg total) by mouth daily.  . Cholecalciferol (VITAMIN D) 2000 units tablet Take 2,000 Units by mouth daily. 40,000 units daily (takes 2 pills of 20,000 units)  . cyclobenzaprine (FLEXERIL) 10 MG tablet Take 1 tablet (10 mg total) by mouth 3 (three) times daily as needed for muscle spasms.  . fluticasone (FLONASE) 50 MCG/ACT nasal spray Place 1 spray into both nostrils daily.  . Glucosamine-Chondroit-Vit C-Mn (GLUCOSAMINE 1500 COMPLEX PO) Take 2 tablets by mouth once.   Marland Kitchen ibuprofen (ADVIL) 200 MG tablet Take 200 mg by mouth as needed.  Marland Kitchen levothyroxine (SYNTHROID) 112 MCG tablet Take 1 tablet (112 mcg total) by mouth daily.  Marland Kitchen loratadine (CLARITIN) 10 MG tablet Take 10 mg by mouth daily.  . meclizine (ANTIVERT) 25 MG tablet Take 1 tablet (25 mg total) by mouth every 4 (four) hours as needed for dizziness.  . meloxicam (MOBIC) 15 MG tablet Take 1 tablet (15 mg total) by mouth daily.  . Multiple Vitamin (MULTIVITAMIN) capsule Take 1 capsule by mouth daily.    Marland Kitchen trolamine salicylate (ASPERCREME) 10 % cream Apply 1 application topically as needed for muscle pain.  . TURMERIC PO Take 800 mg by mouth daily.  . valACYclovir  (VALTREX) 1000 MG tablet Take 1 tablet (1,000 mg total) by mouth daily. Take for 5 days.  . vitamin E (VITAMIN E) 180 MG (400 UNITS) capsule Take 400 Units by mouth daily.  . Zinc 100 MG TABS Take 1 tablet by mouth daily.   No facility-administered encounter medications on file as of 06/02/2020.    Current Diagnosis: Patient Active Problem List   Diagnosis Date Noted  . Environmental and seasonal allergies 12/30/2017  . Urinary, incontinence, stress female 01/30/2013  . Urinary frequency 01/30/2013  . Vitamin D deficiency 09/03/2011  . Hypothyroid 08/29/2010  . BACK PAIN, LUMBAR 08/29/2009  . SCIATICA, LEFT 08/29/2009  . HYPERCHOLESTEROLEMIA 08/22/2009  . BENIGN POSITIONAL VERTIGO 03/23/2009  . Essential hypertension 05/27/2007  . 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 TENSION HEADACHE 10/21/2006  . Migraine headache 10/21/2006  . SYNDROME, CARPAL TUNNEL 10/21/2006  . FIBROCYSTIC BREAST DISEASE 10/21/2006  . Osteopenia 10/21/2006    Goals Addressed   None     Follow-Up:  Pharmacist Review  I called the patient and discussed medication adherence with the patient, no issues at this time with current medication. She states she has been doing well. She has continued to take her medication as written.  She has been working on healthy eating habits. She denies ED visits since his last CPP follow-up. She also denies any side effects with his medication and any problems  with his current pharmacy.  Maia Breslow, [place] Assistant [phone]

## 2020-06-27 DIAGNOSIS — R3911 Hesitancy of micturition: Secondary | ICD-10-CM | POA: Diagnosis not present

## 2020-08-02 ENCOUNTER — Telehealth: Payer: Self-pay | Admitting: Pharmacist

## 2020-08-02 NOTE — Chronic Care Management (AMB) (Signed)
I left the patient a message about his upcoming appointment on 04.20.2022 @ 11:00 am with the clinical pharmacist. She was asked to please have all medication on hand to review with the pharmacist.   Neita Goodnight) Mare Ferrari, [place] [phone]

## 2020-08-03 ENCOUNTER — Ambulatory Visit (INDEPENDENT_AMBULATORY_CARE_PROVIDER_SITE_OTHER): Payer: Medicare HMO | Admitting: Pharmacist

## 2020-08-03 DIAGNOSIS — I10 Essential (primary) hypertension: Secondary | ICD-10-CM

## 2020-08-03 DIAGNOSIS — E7800 Pure hypercholesterolemia, unspecified: Secondary | ICD-10-CM

## 2020-08-03 NOTE — Progress Notes (Signed)
Chronic Care Management Pharmacy Note  08/11/2020 Name:  Meghan Dixon MRN:  [id number] DOB:  15-Jun-1945  Subjective: Meghan Dixon is an 75 y.o. year old female who is a primary patient of [name], MD.  The CCM team was consulted for assistance with disease management and care coordination needs.    Engaged with patient by telephone for follow up visit in response to provider referral for pharmacy case management and/or care coordination services.   Consent to Services:  The patient was given information about Chronic Care Management services, agreed to services, and gave verbal consent prior to initiation of services.  Please see initial visit note for detailed documentation.   Patient Care Team: [name], MD as PCP - General (Family Medicine) [name], CNM as Midwife (Certified Nurse Midwife) [name], [facility] as Pharmacist (Pharmacist)  Recent office visits: 02/15/20 [name], MD: Patient presented for urinary frequency. Recommended Miralax for constipation.  02/02/20 [name], MD: Patient presented for neck pain.   Recent consult visits: 05/25/20 [name] (urology): Unable to access notes.  04/11/20 [name] (dermatology): Unable to access notes.   Hospital visits: None in previous 6 months  Objective:  Lab Results  Component Value Date   CREATININE 0.72 10/23/2019   BUN 17 10/23/2019   GFR 85.15 07/10/2019   GFRNONAA 90 (L) 05/06/2011   GFRAA >90 05/06/2011   NA 141 10/23/2019   K 4.5 10/23/2019   CALCIUM 9.2 10/23/2019   CO2 29 10/23/2019   GLUCOSE 98 10/23/2019    Lab Results  Component Value Date/Time   HGBA1C 5.6 06/05/2006 10:14 AM   GFR 85.15 07/10/2019 08:53 AM   GFR 85.34 10/10/2018 09:54 AM    Last diabetic Eye exam: No results found for: HMDIABEYEEXA  Last diabetic Foot exam: No results found for: HMDIABFOOTEX   Lab Results  Component Value Date   CHOL 201 (H) 10/23/2019   HDL 58 10/23/2019    LDLCALC 123 (H) 10/23/2019   LDLDIRECT 118.5 06/05/2006   TRIG 101 10/23/2019   CHOLHDL 3.5 10/23/2019    Hepatic Function Latest Ref Rng & Units 10/23/2019 10/10/2018 10/07/2017  Total Protein 6.1 - 8.1 g/dL 6.8 7.0 7.2  Albumin 3.5 - 5.2 g/dL - 4.4 4.4  AST 10 - 35 U/L '19 20 22  ' ALT 6 - 29 U/L '15 16 22  ' Alk Phosphatase 39 - 117 U/L - 65 63  Total Bilirubin 0.2 - 1.2 mg/dL 0.5 0.5 0.5  Bilirubin, Direct 0.0 - 0.2 mg/dL 0.1 0.1 0.1    Lab Results  Component Value Date/Time   TSH 6.90 (H) 10/23/2019 09:37 AM   TSH 3.97 10/10/2018 09:54 AM   FREET4 1.3 10/23/2019 09:37 AM   FREET4 1.03 10/07/2017 09:17 AM    CBC Latest Ref Rng & Units 10/23/2019 10/10/2018 10/07/2017  WBC 3.8 - 10.8 Thousand/uL 5.7 5.8 5.4  Hemoglobin 11.7 - 15.5 g/dL 13.7 13.3 13.8  Hematocrit 35.0 - 45.0 % 42.2 40.5 40.4  Platelets 140 - 400 Thousand/uL 243 238.0 272.0    Lab Results  Component Value Date/Time   VD25OH 39.69 10/07/2017 09:17 AM   VD25OH 28.75 (L) 02/22/2014 08:28 AM    Clinical ASCVD: No  The 10-year ASCVD risk score [name], et al., 2013) is: 20.1%   Values used to calculate the score:     Age: 67 years     Sex: Female     Is Non-Hispanic African American: No  Diabetic: No     Tobacco smoker: No     Systolic Blood Pressure: 196 mmHg     Is BP treated: Yes     HDL Cholesterol: 58 mg/dL     Total Cholesterol: 201 mg/dL    Depression screen [facility] 2/9 02/15/2020 10/10/2018 10/07/2017  Decreased Interest 0 0 0  Down, Depressed, Hopeless 0 0 0  PHQ - 2 Score 0 0 0      Social History   Tobacco Use  Smoking Status Never Smoker  Smokeless Tobacco Never Used   BP Readings from Last 3 Encounters:  02/15/20 (!) 148/80  02/02/20 140/80  10/14/19 138/72   Pulse Readings from Last 3 Encounters:  02/15/20 94  02/02/20 95  10/14/19 74   Wt Readings from Last 3 Encounters:  02/15/20 175 lb (79.4 kg)  02/02/20 174 lb 3.2 oz (79 kg)  10/14/19 177 lb 3.2 oz (80.4 kg)   BMI  Readings from Last 3 Encounters:  02/15/20 29.12 kg/m  02/02/20 28.99 kg/m  10/14/19 29.49 kg/m    Assessment/Interventions: Review of patient past medical history, allergies, medications, health status, including review of consultants reports, laboratory and other test data, was performed as part of comprehensive evaluation and provision of chronic care management services.   SDOH:  (Social Determinants of Health) assessments and interventions performed: No  SDOH Screenings   Alcohol Screen: Not on file  Depression (PHQ2-9): Low Risk   . PHQ-2 Score: 0  Financial Resource Strain: Low Risk   . Difficulty of Paying Living Expenses: Not hard at all  Food Insecurity: Not on file  Housing: Not on file  Physical Activity: Not on file  Social Connections: Not on file  Stress: Not on file  Tobacco Use: Low Risk   . Smoking Tobacco Use: Never Smoker  . Smokeless Tobacco Use: Never Used  Transportation Needs: No Transportation Needs  . Lack of Transportation (Medical): No  . Lack of Transportation (Non-Medical): No    CCM Care Plan  Allergies  Allergen Reactions  . Augmentin [Amoxicillin-Pot Clavulanate] Other (See Comments)    Extreme heartburn  . Beta Adrenergic Blockers     REACTION: reaction not known  . Cefuroxime Axetil Hives    Throat closes  . Cetirizine Hcl     REACTION: dizzy  . Fexofenadine     REACTION: dizzy  . Prednisone     Mood changer with higher doses     Medications Reviewed Today    Reviewed by Burnis Medin, MD (Physician) on 02/15/20 at [place] List Status: <None>  Medication Order Taking? Sig Documenting Provider Last Dose Status Informant  acetaminophen (TYLENOL) 650 MG CR tablet [id number] Yes Take 650 mg by mouth every 8 (eight) hours as needed for pain. [provider] Taking Active Self  amLODipine (NORVASC) 5 MG tablet [id number] Yes Take 1 tablet (5 mg total) by mouth daily. [name], MD Taking Active   Cholecalciferol  (VITAMIN D) 2000 units tablet [id number] Yes Take 2,000 Units by mouth daily. 40,000 units daily (takes 2 pills of 20,000 units) [provider] Taking Active   cyclobenzaprine (FLEXERIL) 10 MG tablet [id number] Yes Take 1 tablet (10 mg total) by mouth 3 (three) times daily as needed for muscle spasms. [name], MD Taking Active   fluticasone Asencion Islam) 50 MCG/ACT nasal spray [id number] Yes Place 1 spray into both nostrils daily. [provider] Taking Active Self  Glucosamine-Chondroit-Vit C-Mn (GLUCOSAMINE 1500 COMPLEX PO) [id number] Yes Take  2 tablets by mouth once.  [provider] Taking Active Self  ibuprofen (ADVIL) 200 MG tablet [id number] Yes Take 200 mg by mouth as needed. [provider] Taking Active Self  levothyroxine (SYNTHROID) 112 MCG tablet [id number] Yes Take 1 tablet (112 mcg total) by mouth daily. [name], MD Taking Active   loratadine (CLARITIN) 10 MG tablet [id number] Yes Take 10 mg by mouth daily. [provider] Taking Active Self  meclizine (ANTIVERT) 25 MG tablet [id number] Yes Take 1 tablet (25 mg total) by mouth every 4 (four) hours as needed for dizziness. [name], MD Taking Active   meloxicam [facility]) 15 MG tablet [id number] Yes Take 1 tablet (15 mg total) by mouth daily. [name], MD Taking Active   Multiple Vitamin (MULTIVITAMIN) capsule [id number] Yes Take 1 capsule by mouth daily.   [provider] Taking Active   trolamine salicylate (ASPERCREME) 10 % cream [id number] Yes Apply 1 application topically as needed for muscle pain. [provider] Taking Active Self  TURMERIC PO [id number] Yes Take 800 mg by mouth daily. [provider] Taking Active Self  valACYclovir (VALTREX) 1000 MG tablet [id number] Yes Take 1 tablet (1,000 mg total) by mouth daily. Take for 5 days. [name], CNM Taking Active   vitamin E (VITAMIN E) 180 MG (400 UNITS) capsule [id number] Yes Take 400 Units by  mouth daily. [provider] Taking Active Self  Zinc 100 MG TABS [id number] Yes Take 1 tablet by mouth daily. [provider] Taking Active           Patient Active Problem List   Diagnosis Date Noted  . Environmental and seasonal allergies 12/30/2017  . Urinary, incontinence, stress female 01/30/2013  . Urinary frequency 01/30/2013  . Vitamin D deficiency 09/03/2011  . Hypothyroid 08/29/2010  . BACK PAIN, LUMBAR 08/29/2009  . SCIATICA, LEFT 08/29/2009  . HYPERCHOLESTEROLEMIA 08/22/2009  . BENIGN POSITIONAL VERTIGO 03/23/2009  . Essential hypertension 05/27/2007  . 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 TENSION HEADACHE 10/21/2006  . Migraine headache 10/21/2006  . SYNDROME, CARPAL TUNNEL 10/21/2006  . FIBROCYSTIC BREAST DISEASE 10/21/2006  . Osteopenia 10/21/2006    Immunization History  Administered Date(s) Administered  . Fluad Quad(high Dose 65+) 03/24/2019, 02/02/2020  . Influenza Whole 02/19/2005, 01/27/2007, 01/14/2008  . Influenza, High Dose Seasonal PF 04/01/2015, 01/10/2016, 02/06/2017, 02/17/2018  . Influenza,inj,Quad PF,6+ Mos 02/20/2013, 03/01/2014  . Pneumococcal Conjugate-13 06/01/2015  . Pneumococcal Polysaccharide-23 03/01/2014  . Td 02/14/2001  . Tdap 08/29/2010  . Zoster 12/15/2010   Her biggest complaint is that [facility] has her on automatic refills and she often gets more medications than she needs.  Conditions to be addressed/monitored:  Hypertension, Hyperlipidemia, Hypothyroidism, Osteoarthritis and Vitamin D deficiency  Care Plan : CCM Pharmacy Care Plan  Updates made by [name], [place] since 08/11/2020 12:00 AM    Problem: Problem: Hypertension, Hyperlipidemia, Hypothyroidism, Osteoarthritis and Vitamin D deficiency     Long-Range Goal: Patient-Specific Goal   Start Date: 08/03/2020  Expected End Date: 08/03/2021  This Visit's Progress: On track  Priority: High  Note:   Current Barriers:  . Unable to achieve control of cholesterol   . Unable to maintain control of blood pressure  Pharmacist Clinical Goal(s):  Marland Kitchen Patient will achieve adherence to monitoring guidelines and medication adherence to achieve therapeutic efficacy through collaboration with PharmD and provider.   Interventions: . 1:1 collaboration with [name], MD regarding development and update of comprehensive plan of care as  evidenced by provider attestation and co-signature . Inter-disciplinary care team collaboration (see longitudinal plan of care) . Comprehensive medication review performed; medication list updated in electronic medical record  Hypertension (BP goal <140/90) -Controlled -Current treatment: . Amlodipine 69m, 1 tablet once daily -Medications previously tried: none  -Current home readings: 130s/80s or below -Current dietary habits: did not discuss -Current exercise habits: gardening and going on walks. She states she tracks her steps: >6000 steps most day -Denies hypotensive/hypertensive symptoms -Educated on BP goals and benefits of medications for prevention of heart attack, stroke and kidney damage; Daily salt intake goal < 2300 mg; Importance of home blood pressure monitoring; Proper BP monitoring technique; -Counseled to monitor BP at home weekly, document, and provide log at future appointments -Counseled on diet and exercise extensively Recommended to continue current medication Recommended for patient to purchase an arm BP cuff and check weekly  Hyperlipidemia: (LDL goal < 100) -Uncontrolled -Current treatment: . No medications -Medications previously tried: none  -Current dietary patterns: conscious of diet; eats fried chicken once every 2 months and mostly grills food; eats salmon once a week and bacon once a week; eating lots of vegetables and oatmeal -Current exercise habits: doesn't exercise in the winter, tries to garden a lot, good day 4-5,000 steps a day, goes fishing; doesn't go the [employer]-Educated on  Cholesterol goals;  Benefits of statin for ASCVD risk reduction; Importance of limiting foods high in cholesterol; Exercise goal of 150 minutes per week; -Recommended statin therapy pending results of next lipid panel  Hypothyroidism (Goal: TSH 0.35-4.5) -Controlled -Current treatment  . levothyroxine 1163m, 1 tablet once daily  -Medications previously tried: none  -Counseled on taking on an empty stomach with water  Osteoarthritis (Goal: minimize pain) -Controlled -Current treatment   Meloxicam (Mobic) 1548m1 tablet once daily (patient reports using as needed; she can take it a couple of days and then get off it. Never a whole week).   Tylenol arthritis, takes as needed  ibuprofen 200m40matient reported not taking on same days she takes meloxicam)  Aspercreme 10% cream, apply as needed for muscle pain (patient reports using couple of times in a week on joints) -Medications previously tried: none  -Recommended to continue current medication Recommended limiting use of NSAIDs due to kidneys and risk for bleeding  Osteopenia (Goal prevent fractures) -Controlled -Last DEXA Scan: 2017   T-Score femoral neck: -1.8, -1.3  T-Score total hip: n/a  T-Score lumbar spine: -1.4  T-Score forearm radius: n/a  10-year probability of major osteoporotic fracture: 10.2%  10-year probability of hip fracture: 1.5% -Patient is not a candidate for pharmacologic treatment -Current treatment  . Multivitamin (50 mg of calcium) 1 tablet daily . Cholecalciferol (vitamin D) 2000 units, 1 tablet once daily  -Medications previously tried: none  -Recommend [phone] units of vitamin D daily. Recommend 1200 mg of calcium daily from dietary and supplemental sources. Recommend weight-bearing and muscle strengthening exercises for building and maintaining bone density. -Recommended supplementation of at least 600 mg of calcium daily due to current intake  Vitamin D deficiency (Goal:  30-100) -Controlled -Current treatment  . Cholecalciferol (vitamin D) 2000 units, 1 tablet once daily  -Medications previously tried: none  -Recommended to continue current medication Recommended repeat vitamin D level  Health Maintenance -Vaccine gaps: shingrix, COVID -Current therapy:   Glucosamine 1500mg31mplex, 2 tablets once daily   Multivitamin, 1 tablet once daily  Fluticasone (Flonase) nasal spray, 1 spray into both nostrils daily  Turmeric 800mg,65m  tablet once daily  Vitamin E 400 units, 1 capsule once daily  Zinc 50 mg 1 tablet daily -Educated on Cost vs benefit of each product must be carefully weighed by individual consumer Supplements may interfere with prescription drugs -Patient is satisfied with current therapy and denies issues -Recommended to continue current medication  Patient Goals/Self-Care Activities . Patient will:  - take medications as prescribed check blood pressure weekly, document, and provide at future appointments target a minimum of 150 minutes of moderate intensity exercise weekly  Follow Up Plan: Telephone follow up appointment with care management team member scheduled for: 6 months       Medication Assistance: None required.  Patient affirms current coverage meets needs.  Patient's preferred pharmacy is:  [facility], [address] [place] [address] Phone: [phone] Fax: [phone] - [facility], [place] [address] [place] [address] Phone: [phone] Fax: [facility] [place], [place] [address] Phone: [phone] Fax: [phone]  Uses pill box? No - Patient states she turns bottles upside down when she takes them.  Pt endorses 99% compliance  We discussed: Current pharmacy is preferred with insurance plan and patient is satisfied with  pharmacy services Patient decided to: Continue current medication management strategy  Care Plan and Follow Up Patient Decision:  Patient agrees to Care Plan and Follow-up.  Plan: Telephone follow up appointment with care management team member scheduled for:  6 months  Jeni Salles, PharmD [place] Pharmacist [facility] [place] [phone]

## 2020-08-05 DIAGNOSIS — H2513 Age-related nuclear cataract, bilateral: Secondary | ICD-10-CM | POA: Diagnosis not present

## 2020-08-05 DIAGNOSIS — H52223 Regular astigmatism, bilateral: Secondary | ICD-10-CM | POA: Diagnosis not present

## 2020-08-05 DIAGNOSIS — H04123 Dry eye syndrome of bilateral lacrimal glands: Secondary | ICD-10-CM | POA: Diagnosis not present

## 2020-08-05 DIAGNOSIS — H1045 Other chronic allergic conjunctivitis: Secondary | ICD-10-CM | POA: Diagnosis not present

## 2020-08-05 DIAGNOSIS — H5213 Myopia, bilateral: Secondary | ICD-10-CM | POA: Diagnosis not present

## 2020-08-05 DIAGNOSIS — H524 Presbyopia: Secondary | ICD-10-CM | POA: Diagnosis not present

## 2020-08-05 DIAGNOSIS — H43813 Vitreous degeneration, bilateral: Secondary | ICD-10-CM | POA: Diagnosis not present

## 2020-08-11 NOTE — Patient Instructions (Addendum)
Hi Meghan Dixon,  It was great to get to meet you over the telephone! Below is a summary of some of the topics we discussed. As we discussed, make sure to check your blood pressure at home once a week to make sure its in a good range.  Also I think it would be beneficial for you to supplement with calcium (600 mg per day) to make sure you are getting the recommended amounts every day.  Please reach out to me if you have any questions or need anything before our follow up!  Best, Maddie  Jeni Salles, PharmD, [facility]  Visit Information  Goals Addressed   None    Patient Care Plan: CCM Pharmacy Care Plan    Problem Identified: Problem: Hypertension, Hyperlipidemia, Hypothyroidism, Osteoarthritis and Vitamin D deficiency     Long-Range Goal: Patient-Specific Goal   Start Date: 08/03/2020  Expected End Date: 08/03/2021  This Visit's Progress: On track  Priority: High  Note:   Current Barriers:  . Unable to achieve control of cholesterol  . Unable to maintain control of blood pressure  Pharmacist Clinical Goal(s):  Marland Kitchen Patient will achieve adherence to monitoring guidelines and medication adherence to achieve therapeutic efficacy through collaboration with PharmD and provider.   Interventions: . 1:1 collaboration with [name], MD regarding development and update of comprehensive plan of care as evidenced by provider attestation and co-signature . Inter-disciplinary care team collaboration (see longitudinal plan of care) . Comprehensive medication review performed; medication list updated in electronic medical record  Hypertension (BP goal <140/90) -Controlled -Current treatment: . Amlodipine 5mg , 1 tablet once daily -Medications previously tried: none  -Current home readings: 130s/80s or below -Current dietary habits: did not discuss -Current exercise habits: gardening and going on walks. She states she  tracks her steps: >6000 steps most day -Denies hypotensive/hypertensive symptoms -Educated on BP goals and benefits of medications for prevention of heart attack, stroke and kidney damage; Daily salt intake goal < 2300 mg; Importance of home blood pressure monitoring; Proper BP monitoring technique; -Counseled to monitor BP at home weekly, document, and provide log at future appointments -Counseled on diet and exercise extensively Recommended to continue current medication Recommended for patient to purchase an arm BP cuff and check weekly  Hyperlipidemia: (LDL goal < 100) -Uncontrolled -Current treatment: . No medications -Medications previously tried: none  -Current dietary patterns: conscious of diet; eats fried chicken once every 2 months and mostly grills food; eats salmon once a week and bacon once a week; eating lots of vegetables and oatmeal -Current exercise habits: doesn't exercise in the winter, tries to garden a lot, good day 4-5,000 steps a day, goes fishing; doesn't go the [employer] -Educated on Cholesterol goals;  Benefits of statin for ASCVD risk reduction; Importance of limiting foods high in cholesterol; Exercise goal of 150 minutes per week; -Recommended statin therapy pending results of next lipid panel  Hypothyroidism (Goal: TSH 0.35-4.5) -Controlled -Current treatment  . levothyroxine 169mcg, 1 tablet once daily  -Medications previously tried: none  -Counseled on taking on an empty stomach with water  Osteoarthritis (Goal: minimize pain) -Controlled -Current treatment   Meloxicam (Mobic) 15mg , 1 tablet once daily (patient reports using as needed; she can take it a couple of days and then get off it. Never a whole week).   Tylenol arthritis, takes as needed  ibuprofen 200mg  (patient reported not taking on same days she takes meloxicam)  Aspercreme 10% cream, apply as needed  for muscle pain (patient reports using couple of times in a week on  joints) -Medications previously tried: none  -Recommended to continue current medication Recommended limiting use of NSAIDs due to kidneys and risk for bleeding  Osteopenia (Goal prevent fractures) -Controlled -Last DEXA Scan: 2017   T-Score femoral neck: -1.8, -1.3  T-Score total hip: n/a  T-Score lumbar spine: -1.4  T-Score forearm radius: n/a  10-year probability of major osteoporotic fracture: 10.2%  10-year probability of hip fracture: 1.5% -Patient is not a candidate for pharmacologic treatment -Current treatment  . Multivitamin (50 mg of calcium) 1 tablet daily . Cholecalciferol (vitamin D) 2000 units, 1 tablet once daily  -Medications previously tried: none  -Recommend [phone] units of vitamin D daily. Recommend 1200 mg of calcium daily from dietary and supplemental sources. Recommend weight-bearing and muscle strengthening exercises for building and maintaining bone density. -Recommended supplementation of at least 600 mg of calcium daily due to current intake  Vitamin D deficiency (Goal: 30-100) -Controlled -Current treatment  . Cholecalciferol (vitamin D) 2000 units, 1 tablet once daily  -Medications previously tried: none  -Recommended to continue current medication Recommended repeat vitamin D level  Health Maintenance -Vaccine gaps: shingrix, COVID -Current therapy:   Glucosamine 1500mg  complex, 2 tablets once daily   Multivitamin, 1 tablet once daily  Fluticasone (Flonase) nasal spray, 1 spray into both nostrils daily  Turmeric 800mg , 1 tablet once daily  Vitamin E 400 units, 1 capsule once daily  Zinc 50 mg 1 tablet daily -Educated on Cost vs benefit of each product must be carefully weighed by individual consumer Supplements may interfere with prescription drugs -Patient is satisfied with current therapy and denies issues -Recommended to continue current medication  Patient Goals/Self-Care Activities . Patient will:  - take medications as  prescribed check blood pressure weekly, document, and provide at future appointments target a minimum of 150 minutes of moderate intensity exercise weekly  Follow Up Plan: Telephone follow up appointment with care management team member scheduled for: 6 months       Patient verbalizes understanding of instructions provided today and agrees to view in [place].  Telephone follow up appointment with pharmacy team member scheduled for:6 months  [name], [facility]  How to Take Your Blood Pressure Blood pressure measures how strongly your blood is pressing against the walls of your arteries. Arteries are blood vessels that carry blood from your heart throughout your body. You can take your blood pressure at home with a machine. You may need to check your blood pressure at home:  To check if you have high blood pressure (hypertension).  To check your blood pressure over time.  To make sure your blood pressure medicine is working. Supplies needed:  Blood pressure machine, or monitor.  Dining room chair to sit in.  Table or desk.  Small notebook.  Pencil or pen. How to prepare Avoid these things for 30 minutes before checking your blood pressure:  Having drinks with caffeine in them, such as coffee or tea.  Drinking alcohol.  Eating.  Smoking.  Exercising. Do these things five minutes before checking your blood pressure:  Go to the bathroom and pee (urinate).  Sit in a dining chair. Do not sit in a soft couch or an armchair.  Be quiet. Do not talk. How to take your blood pressure Follow the instructions that came with your machine. If you have a digital blood pressure monitor, these may be the instructions: 1. Sit up straight. 2. Place  your feet on the floor. Do not cross your ankles or legs. 3. Rest your left arm at the level of your heart. You may rest it on a table, desk, or chair. 4. Pull up your shirt sleeve. 5. Wrap the blood pressure cuff around the upper  part of your left arm. The cuff should be 1 inch (2.5 cm) above your elbow. It is best to wrap the cuff around bare skin. 6. Fit the cuff snugly around your arm. You should be able to place only one finger between the cuff and your arm. 7. Place the cord so that it rests in the bend of your elbow. 8. Press the power button. 9. Sit quietly while the cuff fills with air and loses air. 10. Write down the numbers on the screen. 11. Wait 2-3 minutes and then repeat steps 1-10.   What do the numbers mean? Two numbers make up your blood pressure. The first number is called systolic pressure. The second is called diastolic pressure. An example of a blood pressure reading is "120 over 80" (or 120/80). If you are an adult and do not have a medical condition, use this guide to find out if your blood pressure is normal: Normal  First number: below 120.  Second number: below 80. Elevated  First number: 120-129.  Second number: below 80. Hypertension stage 1  First number: 130-139.  Second number: 80-89. Hypertension stage 2  First number: 140 or above.  Second number: 50 or above. Your blood pressure is above normal even if only the top or bottom number is above normal. Follow these instructions at home:  Check your blood pressure as often as your doctor tells you to.  Check your blood pressure at the same time every day.  Take your monitor to your next doctor's appointment. Your doctor will: ? Make sure you are using it correctly. ? Make sure it is working right.  Make sure you understand what your blood pressure numbers should be.  Tell your doctor if your medicine is causing side effects.  Keep all follow-up visits as told by your doctor. This is important. General tips:  You will need a blood pressure machine, or monitor. Your doctor can suggest a monitor. You can buy one at a drugstore or online. When choosing one: ? Choose one with an arm cuff. ? Choose one that wraps  around your upper arm. Only one finger should fit between your arm and the cuff. ? Do not choose one that measures your blood pressure from your wrist or finger. Where to find more information American Heart Association: www.heart.org Contact a doctor if:  Your blood pressure keeps being high. Get help right away if:  Your first blood pressure number is higher than 180.  Your second blood pressure number is higher than 120. Summary  Check your blood pressure at the same time every day.  Avoid caffeine, alcohol, smoking, and exercise for 30 minutes before checking your blood pressure.  Make sure you understand what your blood pressure numbers should be. This information is not intended to replace advice given to you by your health care provider. Make sure you discuss any questions you have with your health care provider. Document Revised: 03/27/2019 Document Reviewed: 03/27/2019 Elsevier Patient Education  2021 [employer].

## 2020-10-14 ENCOUNTER — Encounter: Payer: Medicare HMO | Admitting: Family Medicine

## 2020-10-18 ENCOUNTER — Encounter: Payer: Medicare HMO | Admitting: Family Medicine

## 2020-10-24 ENCOUNTER — Encounter: Payer: Self-pay | Admitting: Family Medicine

## 2020-10-24 ENCOUNTER — Other Ambulatory Visit: Payer: Self-pay

## 2020-10-24 ENCOUNTER — Ambulatory Visit (INDEPENDENT_AMBULATORY_CARE_PROVIDER_SITE_OTHER): Payer: Medicare HMO | Admitting: Family Medicine

## 2020-10-24 VITALS — BP 130/86 | HR 78 | Temp 98.3°F | Ht 63.5 in | Wt 173.0 lb

## 2020-10-24 DIAGNOSIS — B009 Herpesviral infection, unspecified: Secondary | ICD-10-CM | POA: Diagnosis not present

## 2020-10-24 DIAGNOSIS — Z0000 Encounter for general adult medical examination without abnormal findings: Secondary | ICD-10-CM

## 2020-10-24 LAB — TSH: TSH: 2.63 u[IU]/mL (ref 0.35–5.50)

## 2020-10-24 LAB — BASIC METABOLIC PANEL
BUN: 15 mg/dL (ref 6–23)
CO2: 29 mEq/L (ref 19–32)
Calcium: 9.5 mg/dL (ref 8.4–10.5)
Chloride: 104 mEq/L (ref 96–112)
Creatinine, Ser: 0.73 mg/dL (ref 0.40–1.20)
GFR: 81.9 mL/min (ref >60.00)
Glucose, Bld: 95 mg/dL (ref 70–99)
Potassium: 4 mEq/L (ref 3.5–5.1)
Sodium: 142 mEq/L (ref 135–145)

## 2020-10-24 LAB — CBC WITH DIFFERENTIAL/PLATELET
Basophils Absolute: 0.1 10*3/uL (ref 0.0–0.1)
Basophils Relative: 0.8 % (ref 0.0–3.0)
Eosinophils Absolute: 0.1 10*3/uL (ref 0.0–0.7)
Eosinophils Relative: 1.7 % (ref 0.0–5.0)
HCT: 41.6 % (ref 36.0–46.0)
Hemoglobin: 13.8 g/dL (ref 12.0–15.0)
Lymphocytes Relative: 26 % (ref 12.0–46.0)
Lymphs Abs: 1.6 10*3/uL (ref 0.7–4.0)
MCHC: 33.3 g/dL (ref 30.0–36.0)
MCV: 91.8 fl (ref 78.0–100.0)
Monocytes Absolute: 0.6 10*3/uL (ref 0.1–1.0)
Monocytes Relative: 8.8 % (ref 3.0–12.0)
Neutro Abs: 4 10*3/uL (ref 1.4–7.7)
Neutrophils Relative %: 62.7 % (ref 43.0–77.0)
Platelets: 242 10*3/uL (ref 150.0–400.0)
RBC: 4.53 Mil/uL (ref 3.87–5.11)
RDW: 13.2 % (ref 11.5–15.5)
WBC: 6.3 10*3/uL (ref 4.0–10.5)

## 2020-10-24 LAB — LIPID PANEL
Cholesterol: 203 mg/dL — ABNORMAL HIGH (ref 0–200)
HDL: 60.7 mg/dL (ref >39.00)
LDL Cholesterol: 127 mg/dL — ABNORMAL HIGH (ref 0–99)
NonHDL: 142.25
Total CHOL/HDL Ratio: 3
Triglycerides: 75 mg/dL (ref 0.0–149.0)
VLDL: 15 mg/dL (ref 0.0–40.0)

## 2020-10-24 LAB — HEPATIC FUNCTION PANEL
ALT: 19 U/L (ref 0–35)
AST: 20 U/L (ref 0–37)
Albumin: 4.5 g/dL (ref 3.5–5.2)
Alkaline Phosphatase: 64 U/L (ref 39–117)
Bilirubin, Direct: 0.1 mg/dL (ref 0.0–0.3)
Total Bilirubin: 0.4 mg/dL (ref 0.2–1.2)
Total Protein: 7.1 g/dL (ref 6.0–8.3)

## 2020-10-24 LAB — T3, FREE: T3, Free: 3.9 pg/mL (ref 2.3–4.2)

## 2020-10-24 LAB — HEMOGLOBIN A1C: Hgb A1c MFr Bld: 5.8 % (ref 4.6–6.5)

## 2020-10-24 LAB — VITAMIN D 25 HYDROXY (VIT D DEFICIENCY, FRACTURES): VITD: 40.91 ng/mL (ref 30.00–100.00)

## 2020-10-24 LAB — T4, FREE: Free T4: 1.11 ng/dL (ref 0.60–1.60)

## 2020-10-24 MED ORDER — LEVOTHYROXINE SODIUM 112 MCG PO TABS
112.0000 ug | ORAL_TABLET | Freq: Every day | ORAL | 3 refills | Status: DC
Start: 2020-10-24 — End: 2021-10-27

## 2020-10-24 MED ORDER — VALACYCLOVIR HCL 1 G PO TABS: 1000.0000 mg | ORAL_TABLET | Freq: Every day | ORAL | 3 refills | Status: DC

## 2020-10-24 MED ORDER — AMLODIPINE BESYLATE 5 MG PO TABS
5.0000 mg | ORAL_TABLET | Freq: Every day | ORAL | 3 refills | Status: DC
Start: 2020-10-24 — End: 2021-10-27

## 2020-10-24 NOTE — Progress Notes (Signed)
   Subjective:    Patient ID: Meghan Dixon, female    DOB: 07-24-1945, 75 y.o.   MRN: [id number]  HPI Here for a well exam. She feels fine except for some pain in the right knee. She had arthroscopic surgery in this knee per Dr. Veverly Fells in 2-16. She is taking Meloxicam for it.    Review of Systems  Constitutional: Negative.   HENT: Negative.    Eyes: Negative.   Respiratory: Negative.    Cardiovascular: Negative.   Gastrointestinal: Negative.   Genitourinary:  Negative for decreased urine volume, difficulty urinating, dyspareunia, dysuria, enuresis, flank pain, frequency, hematuria, pelvic pain and urgency.  Musculoskeletal:  Positive for arthralgias.  Skin: Negative.   Neurological: Negative.  Negative for headaches.  Psychiatric/Behavioral: Negative.        Objective:   Physical Exam Constitutional:      General: She is not in acute distress.    Appearance: Normal appearance. She is well-developed.  HENT:     Head: Normocephalic and atraumatic.     Right Ear: External ear normal.     Left Ear: External ear normal.     Nose: Nose normal.     Mouth/Throat:     Pharynx: No oropharyngeal exudate.  Eyes:     General: No scleral icterus.    Conjunctiva/sclera: Conjunctivae normal.     Pupils: Pupils are equal, round, and reactive to light.  Neck:     Thyroid: No thyromegaly.     Vascular: No JVD.  Cardiovascular:     Rate and Rhythm: Normal rate and regular rhythm.     Heart sounds: Normal heart sounds. No murmur heard.   No friction rub. No gallop.  Pulmonary:     Effort: Pulmonary effort is normal. No respiratory distress.     Breath sounds: Normal breath sounds. No wheezing or rales.  Chest:     Chest wall: No tenderness.  Abdominal:     General: Bowel sounds are normal. There is no distension.     Palpations: Abdomen is soft. There is no mass.     Tenderness: There is no abdominal tenderness. There is no guarding or rebound.  Musculoskeletal:        General: No  tenderness. Normal range of motion.     Cervical back: Normal range of motion and neck supple.  Lymphadenopathy:     Cervical: No cervical adenopathy.  Skin:    General: Skin is warm and dry.     Findings: No erythema or rash.  Neurological:     Mental Status: She is alert and oriented to person, place, and time.     Cranial Nerves: No cranial nerve deficit.     Motor: No abnormal muscle tone.     Coordination: Coordination normal.     Deep Tendon Reflexes: Reflexes are normal and symmetric. Reflexes normal.  Psychiatric:        Behavior: Behavior normal.        Thought Content: Thought content normal.        Judgment: Judgment normal.          Assessment & Plan:  Well exam. We discussed diet and exercise. Get fasting labs. For the knee pain, I suggested she wear an elastic support sleeve.  [name], MD

## 2020-11-14 ENCOUNTER — Telehealth: Payer: Self-pay | Admitting: Pharmacist

## 2020-11-14 NOTE — Chronic Care Management (AMB) (Signed)
Chronic Care Management Pharmacy Assistant   Name: Meghan Dixon  MRN: [id number] DOB: [birth year]  Reason for Encounter: Disease State   Conditions to be addressed/monitored: HTN  Recent office visits:  10-24-2020 Meghan Morale, MD ( PCP) - Patient presented for Preventative health care and other concerns. No medication changes.   Recent consult visits:  08-05-2020 [name] [facility]) - Patient presented for  Eye exam and computerized imaging of retina. No note details available.  Hospital visits:  None in previous 6 months  Medications: Outpatient Encounter Medications as of 11/14/2020  Medication Sig   acetaminophen (TYLENOL) 650 MG CR tablet Take 650 mg by mouth every 8 (eight) hours as needed for pain.   amLODipine (NORVASC) 5 MG tablet Take 1 tablet (5 mg total) by mouth daily.   Cholecalciferol (VITAMIN D) 2000 units tablet Take 2,000 Units by mouth daily. 40,000 units daily (takes 2 pills of 20,000 units)   cyclobenzaprine (FLEXERIL) 10 MG tablet Take 1 tablet (10 mg total) by mouth 3 (three) times daily as needed for muscle spasms.   fluticasone (FLONASE) 50 MCG/ACT nasal spray Place 1 spray into both nostrils daily.   Glucosamine-Chondroit-Vit C-Mn (GLUCOSAMINE 1500 COMPLEX PO) Take 2 tablets by mouth once.    ibuprofen (ADVIL) 200 MG tablet Take 200 mg by mouth as needed.   levothyroxine (SYNTHROID) 112 MCG tablet Take 1 tablet (112 mcg total) by mouth daily.   loratadine (CLARITIN) 10 MG tablet Take 10 mg by mouth daily.   meclizine (ANTIVERT) 25 MG tablet Take 1 tablet (25 mg total) by mouth every 4 (four) hours as needed for dizziness.   meloxicam (MOBIC) 15 MG tablet Take 1 tablet (15 mg total) by mouth daily.   Multiple Vitamin (MULTIVITAMIN) capsule Take 1 capsule by mouth daily.     Probiotic Product (ALIGN PO) Take by mouth daily.   trolamine salicylate (ASPERCREME) 10 % cream Apply 1 application topically as needed for muscle pain.   TURMERIC PO Take 800  mg by mouth daily.   valACYclovir (VALTREX) 1000 MG tablet Take 1 tablet (1,000 mg total) by mouth daily. Take for 5 days.   vitamin E 180 MG (400 UNITS) capsule Take 400 Units by mouth daily.   Zinc 100 MG TABS Take 1 tablet by mouth daily.   No facility-administered encounter medications on file as of 11/14/2020.   Reviewed chart prior to disease state call. Spoke with patient regarding BP  Recent Office Vitals: BP Readings from Last 3 Encounters:  10/24/20 130/86  02/15/20 (!) 148/80  02/02/20 140/80   Pulse Readings from Last 3 Encounters:  10/24/20 78  02/15/20 94  02/02/20 95    Wt Readings from Last 3 Encounters:  10/24/20 173 lb (78.5 kg)  02/15/20 175 lb (79.4 kg)  02/02/20 174 lb 3.2 oz (79 kg)     Kidney Function Lab Results  Component Value Date/Time   CREATININE 0.73 10/24/2020 11:09 AM   CREATININE 0.72 10/23/2019 09:37 AM   CREATININE 0.68 07/10/2019 08:53 AM   GFR 81.90 10/24/2020 11:09 AM   GFRNONAA 90 (L) 05/06/2011 07:57 AM   GFRAA >90 05/06/2011 07:57 AM    BMP Latest Ref Rng & Units 10/24/2020 10/23/2019 07/10/2019  Glucose 70 - 99 mg/dL 95 98 100(H)  BUN 6 - 23 mg/dL '15 17 12  '$ Creatinine 0.40 - 1.20 mg/dL 0.73 0.72 0.68  BUN/Creat Ratio 6 - 22 (calc) - NOT APPLICABLE -  Sodium A999333 - 145 mEq/L  142 141 141  Potassium 3.5 - 5.1 mEq/L 4.0 4.5 4.2  Chloride 96 - 112 mEq/L 104 104 106  CO2 19 - 32 mEq/L '29 29 29  '$ Calcium 8.4 - 10.5 mg/dL 9.5 9.2 9.3    Current antihypertensive regimen:  Amlodipine '5mg'$ , 1 tablet once daily How often are you checking your Blood Pressure? weekly Current home BP readings: Patient reports she is checking weekly was 120/70 on last week What recent interventions/DTPs have been made by any provider to improve Blood Pressure control since last CPP Visit: Patient reports none Any recent hospitalizations or ED visits since last visit with CPP? No What diet changes have been made to improve Blood Pressure Control?  Patient  reports she does not use any salt in her meals when cooking What exercise is being done to improve your Blood Pressure Control?  Patient reports she gardens and is very busy canning and in the yard for her physical activity. Adherence Review: Is the patient currently on ACE/ARB medication? No Does the patient have >5 day gap between last estimated fill dates? No Notes: Patient reports she is doing well with her pressures and has no other concerns to report at this time.  Care Gaps:  CCM F/U call - Scheduled 02-01-2021 8:30am AWV-MSG sent to Ramond Craver CMA to schedule. COVID vaccines - Overdue Zoster vaccine - Overdue TDAP - Overdue Flu vaccine - Overdue  Star Rating Drugs:  None  Ned Clines [place] Clinical Pharmacist Assistant [phone]

## 2020-11-22 DIAGNOSIS — M25561 Pain in right knee: Secondary | ICD-10-CM | POA: Diagnosis not present

## 2020-11-22 DIAGNOSIS — M25511 Pain in right shoulder: Secondary | ICD-10-CM | POA: Diagnosis not present

## 2020-11-22 DIAGNOSIS — M542 Cervicalgia: Secondary | ICD-10-CM | POA: Diagnosis not present

## 2021-01-02 ENCOUNTER — Other Ambulatory Visit: Payer: Self-pay | Admitting: Family Medicine

## 2021-01-02 DIAGNOSIS — Z1231 Encounter for screening mammogram for malignant neoplasm of breast: Secondary | ICD-10-CM

## 2021-01-30 ENCOUNTER — Telehealth: Payer: Medicare HMO

## 2021-01-31 ENCOUNTER — Telehealth: Payer: Self-pay | Admitting: Pharmacist

## 2021-01-31 NOTE — Chronic Care Management (AMB) (Signed)
    Chronic Care Management Pharmacy Assistant   Name: Meghan Dixon  MRN: [id number] DOB: 12/12/1945  01/31/21 APPOINTMENT [place] was reminded to have all medications, supplements and any blood glucose and blood pressure readings available for review with Jeni Salles, Pharm. D, at her telephone visit on 02/01/21 at 8:30.   Questions: Have you had any recent office visit or specialist visit outside of [place]? Yes Orthopedic Surgeon  Are there any concerns you would like to discuss during your office visit? Yes CCM visits   Care Gaps: CCM F/U call - Scheduled 02-01-2021 8:30am AWV-office aware to schedule COVID vaccines - Overdue Zoster vaccine - Overdue TDAP - Overdue Flu vaccine - Overdue BP- 130/86  Star Rating Drug: None  Any gaps in medications fill history? None   Medications: Outpatient Encounter Medications as of 01/31/2021  Medication Sig   acetaminophen (TYLENOL) 650 MG CR tablet Take 650 mg by mouth every 8 (eight) hours as needed for pain.   amLODipine (NORVASC) 5 MG tablet Take 1 tablet (5 mg total) by mouth daily.   Cholecalciferol (VITAMIN D) 2000 units tablet Take 2,000 Units by mouth daily. 40,000 units daily (takes 2 pills of 20,000 units)   cyclobenzaprine (FLEXERIL) 10 MG tablet Take 1 tablet (10 mg total) by mouth 3 (three) times daily as needed for muscle spasms.   fluticasone (FLONASE) 50 MCG/ACT nasal spray Place 1 spray into both nostrils daily.   Glucosamine-Chondroit-Vit C-Mn (GLUCOSAMINE 1500 COMPLEX PO) Take 2 tablets by mouth once.    ibuprofen (ADVIL) 200 MG tablet Take 200 mg by mouth as needed.   levothyroxine (SYNTHROID) 112 MCG tablet Take 1 tablet (112 mcg total) by mouth daily.   loratadine (CLARITIN) 10 MG tablet Take 10 mg by mouth daily.   meclizine (ANTIVERT) 25 MG tablet Take 1 tablet (25 mg total) by mouth every 4 (four) hours as needed for dizziness.   meloxicam (MOBIC) 15 MG tablet Take 1  tablet (15 mg total) by mouth daily.   Multiple Vitamin (MULTIVITAMIN) capsule Take 1 capsule by mouth daily.     Probiotic Product (ALIGN PO) Take by mouth daily.   trolamine salicylate (ASPERCREME) 10 % cream Apply 1 application topically as needed for muscle pain.   TURMERIC PO Take 800 mg by mouth daily.   valACYclovir (VALTREX) 1000 MG tablet Take 1 tablet (1,000 mg total) by mouth daily. Take for 5 days.   vitamin E 180 MG (400 UNITS) capsule Take 400 Units by mouth daily.   Zinc 100 MG TABS Take 1 tablet by mouth daily.   No facility-administered encounter medications on file as of 01/31/2021.    [place] Clinical Pharmacist Assistant [phone]

## 2021-02-01 ENCOUNTER — Ambulatory Visit (INDEPENDENT_AMBULATORY_CARE_PROVIDER_SITE_OTHER): Payer: Medicare HMO | Admitting: Pharmacist

## 2021-02-01 DIAGNOSIS — I10 Essential (primary) hypertension: Secondary | ICD-10-CM

## 2021-02-01 DIAGNOSIS — E7800 Pure hypercholesterolemia, unspecified: Secondary | ICD-10-CM

## 2021-02-01 NOTE — Patient Instructions (Signed)
Hi Meghan Dixon,  It was great to catch up with you again! I attached some more information about your cholesterol and ways to lower it with your diet, hope this helps!  Please reach out to me if you have any questions or need anything before our follow up!  Best, Maddie  Jeni Salles, PharmD, [place] [facility] [place]   Visit Information   Goals Addressed   None    Patient Care Plan: CCM Pharmacy Care Plan     Problem Identified: Problem: Hypertension, Hyperlipidemia, Hypothyroidism, Osteoarthritis and Vitamin D deficiency      Long-Range Goal: Patient-Specific Goal   Start Date: 08/03/2020  Expected End Date: 08/03/2021  Recent Progress: On track  Priority: High  Note:   Current Barriers:  Unable to achieve control of cholesterol  Unable to maintain control of blood pressure  Pharmacist Clinical Goal(s):  Patient will achieve adherence to monitoring guidelines and medication adherence to achieve therapeutic efficacy through collaboration with PharmD and provider.   Interventions: 1:1 collaboration with [name], MD regarding development and update of comprehensive plan of care as evidenced by provider attestation and co-signature Inter-disciplinary care team collaboration (see longitudinal plan of care) Comprehensive medication review performed; medication list updated in electronic medical record  Hypertension (BP goal <140/90) -Controlled -Current treatment: Amlodipine 5mg , 1 tablet once daily -Medications previously tried: none  -Current home readings: 134/80, 136/72 (135-140/<80) - maybe once a month -Current dietary habits: did not discuss -Current exercise habits: gardening and going on walks; she states she tracks her steps: 3000-4000 steps most day - not walking as much because the road became gravel -Reports hypotensive/hypertensive symptoms -Educated on BP goals and benefits of medications for prevention  of heart attack, stroke and kidney damage; Daily salt intake goal < 2300 mg; Importance of home blood pressure monitoring; Proper BP monitoring technique; -Counseled to monitor BP at home weekly, document, and provide log at future appointments -Counseled on diet and exercise extensively Recommended to continue current medication Recommended for patient to purchase an arm BP cuff and check weekly  Hyperlipidemia: (LDL goal < 100) -Uncontrolled -Current treatment: No medications -Medications previously tried: none  -Current dietary patterns: conscious of diet; eats fried chicken once every 2 months and mostly grills food; eats salmon once a week and bacon once a week; eating lots of vegetables and oatmeal; uses olive oil; uses butter and doesn't like margarine -Current exercise habits: doesn't exercise in the winter, tries to garden a lot, good day 4-5,000 steps a day, goes fishing; doesn't go the [employer] -Educated on Cholesterol goals;  Benefits of statin for ASCVD risk reduction; Importance of limiting foods high in cholesterol; Exercise goal of 150 minutes per week; -Recommended statin therapy pending results of next lipid panel  Hypothyroidism (Goal: TSH 0.35-4.5) -Controlled -Current treatment  levothyroxine 126mcg, 1 tablet once daily  -Medications previously tried: none  -Counseled on taking on an empty stomach with water  Osteoarthritis (Goal: minimize pain) -Controlled -Current treatment  Meloxicam (Mobic) 15mg , 1 tablet once daily (patient reports using as needed; she can take it a couple of days and then get off it. Never a whole week) Tylenol arthritis, takes as needed Ibuprofen 200mg  (patient reported not taking on same days she takes meloxicam) Aspercreme 10% cream, apply as needed for muscle pain (patient reports using couple of times in a week on joints) -Medications previously tried: none  -Recommended to continue current medication Recommended limiting use of  NSAIDs due to kidneys  and risk for bleeding  Osteopenia (Goal prevent fractures) -Controlled -Last DEXA Scan: 2017   T-Score femoral neck: -1.8, -1.3  T-Score total hip: n/a  T-Score lumbar spine: -1.4  T-Score forearm radius: n/a  10-year probability of major osteoporotic fracture: 10.2%  10-year probability of hip fracture: 1.5% -Patient is not a candidate for pharmacologic treatment -Current treatment  Multivitamin (50 mg of calcium) 1 tablet daily Cholecalciferol (vitamin D) 2000 units, 1 tablet once daily  Calcium carbonate 600 mg daily -Medications previously tried: none  -Recommend [id number] units of vitamin D daily. Recommend 1200 mg of calcium daily from dietary and supplemental sources. Recommend weight-bearing and muscle strengthening exercises for building and maintaining bone density. -Recommended to continue current medication  Vitamin D deficiency (Goal: 30-100) -Controlled -Current treatment  Cholecalciferol (vitamin D) 2000 units, 1 tablet once daily  -Medications previously tried: none  -Recommended to continue current medication  Health Maintenance -Vaccine gaps: shingrix, COVID -Current therapy:  Glucosamine 1500mg  complex, 2 tablets once daily  Multivitamin, 1 tablet once daily Fluticasone (Flonase) nasal spray, 1 spray into both nostrils daily Turmeric 800mg , 1 tablet once daily Vitamin E 400 units, 1 capsule once daily (not taking every day) Zinc 50 mg 1 tablet daily -Educated on Cost vs benefit of each product must be carefully weighed by individual consumer Supplements may interfere with prescription drugs -Patient is satisfied with current therapy and denies issues -Recommended to continue current medication  Patient Goals/Self-Care Activities Patient will:  - take medications as prescribed check blood pressure weekly, document, and provide at future appointments target a minimum of 150 minutes of moderate intensity exercise weekly  Follow Up  Plan: Telephone follow up appointment with care management team member scheduled for: 9 months       Patient verbalizes understanding of instructions provided today and agrees to view in [place].  Telephone follow up appointment with pharmacy team member scheduled for: July 2023  [name], [facility]

## 2021-02-01 NOTE — Progress Notes (Signed)
Chronic Care Management Pharmacy Note  02/01/2021 Name:  Meghan Dixon MRN:  [id number] DOB:  1946-03-29  Summary: LDL not at goal < 100 BP is at goal < 140/90  Recommendations/Changes made from today's visit: -Recommended separating calcium from multivitamin to absorb the most from calcium -Recommend CAC scoring based on patient preference for statin therapy recommendation  Plan: Follow up prior to CPE with PCP  Subjective: Meghan Dixon is an 75 y.o. year old female who is a primary patient of [name], MD.  The CCM team was consulted for assistance with disease management and care coordination needs.    Engaged with patient by telephone for follow up visit in response to provider referral for pharmacy case management and/or care coordination services.   Consent to Services:  The patient was given information about Chronic Care Management services, agreed to services, and gave verbal consent prior to initiation of services.  Please see initial visit note for detailed documentation.   Patient Care Team: [name], MD as PCP - General (Family Medicine) [name], CNM as Midwife (Certified Nurse Midwife) [name], [facility] as Pharmacist (Pharmacist)  Recent office visits: 10/24/20 [name], MD ( PCP) - Patient presented for Preventative health care and other concerns. No medication changes.   Recent consult visits: 11/22/20 Esmond Plants (ortho): Unable to access notes.  08/05/20 [name] [facility]) - Patient presented for  Eye exam and computerized imaging of retina. No note details available.  Hospital visits: None in previous 6 months  Objective:  Lab Results  Component Value Date   CREATININE 0.73 10/24/2020   BUN 15 10/24/2020   GFR 81.90 10/24/2020   GFRNONAA 90 (L) 05/06/2011   GFRAA >90 05/06/2011   NA 142 10/24/2020   K 4.0 10/24/2020   CALCIUM 9.5 10/24/2020   CO2 29 10/24/2020   GLUCOSE 95 10/24/2020    Lab Results   Component Value Date/Time   HGBA1C 5.8 10/24/2020 11:09 AM   HGBA1C 5.6 06/05/2006 10:14 AM   GFR 81.90 10/24/2020 11:09 AM   GFR 85.15 07/10/2019 08:53 AM    Last diabetic Eye exam: No results found for: HMDIABEYEEXA  Last diabetic Foot exam: No results found for: HMDIABFOOTEX   Lab Results  Component Value Date   CHOL 203 (H) 10/24/2020   HDL 60.70 10/24/2020   LDLCALC 127 (H) 10/24/2020   LDLDIRECT 118.5 06/05/2006   TRIG 75.0 10/24/2020   CHOLHDL 3 10/24/2020    Hepatic Function Latest Ref Rng & Units 10/24/2020 10/23/2019 10/10/2018  Total Protein 6.0 - 8.3 g/dL 7.1 6.8 7.0  Albumin 3.5 - 5.2 g/dL 4.5 - 4.4  AST 0 - 37 U/L '20 19 20  ' ALT 0 - 35 U/L '19 15 16  ' Alk Phosphatase 39 - 117 U/L 64 - 65  Total Bilirubin 0.2 - 1.2 mg/dL 0.4 0.5 0.5  Bilirubin, Direct 0.0 - 0.3 mg/dL 0.1 0.1 0.1    Lab Results  Component Value Date/Time   TSH 2.63 10/24/2020 11:09 AM   TSH 6.90 (H) 10/23/2019 09:37 AM   FREET4 1.11 10/24/2020 11:09 AM   FREET4 1.3 10/23/2019 09:37 AM    CBC Latest Ref Rng & Units 10/24/2020 10/23/2019 10/10/2018  WBC 4.0 - 10.5 K/uL 6.3 5.7 5.8  Hemoglobin 12.0 - 15.0 g/dL 13.8 13.7 13.3  Hematocrit 36.0 - 46.0 % 41.6 42.2 40.5  Platelets 150.0 - 400.0 K/uL 242.0 243 238.0    Lab Results  Component  Value Date/Time   VD25OH 40.91 10/24/2020 11:09 AM   VD25OH 39.69 10/07/2017 09:17 AM    Clinical ASCVD: No  The 10-year ASCVD risk score (Arnett DK, et al., 2019) is: 17.6%   Values used to calculate the score:     Age: 75 years     Sex: Female     Is Non-Hispanic African American: No     Diabetic: No     Tobacco smoker: No     Systolic Blood Pressure: 258 mmHg     Is BP treated: Yes     HDL Cholesterol: 60.7 mg/dL     Total Cholesterol: 203 mg/dL     Depression screen [facility] 2/9 10/24/2020 02/15/2020 10/10/2018  Decreased Interest 0 0 0  Down, Depressed, Hopeless 0 0 0  PHQ - 2 Score 0 0 0  Altered sleeping 0 - -  Tired, decreased energy 0 - -  Change  in appetite 0 - -  Feeling bad or failure about yourself  0 - -  Trouble concentrating 0 - -  Moving slowly or fidgety/restless 0 - -  Suicidal thoughts 0 - -  PHQ-9 Score 0 - -  Difficult doing work/chores Not difficult at all - -      Social History   Tobacco Use  Smoking Status Never  Smokeless Tobacco Never   BP Readings from Last 3 Encounters:  10/24/20 130/86  02/15/20 (!) 148/80  02/02/20 140/80   Pulse Readings from Last 3 Encounters:  10/24/20 78  02/15/20 94  02/02/20 95   Wt Readings from Last 3 Encounters:  10/24/20 173 lb (78.5 kg)  02/15/20 175 lb (79.4 kg)  02/02/20 174 lb 3.2 oz (79 kg)   BMI Readings from Last 3 Encounters:  10/24/20 30.16 kg/m  02/15/20 29.12 kg/m  02/02/20 28.99 kg/m    Assessment/Interventions: Review of patient past medical history, allergies, medications, health status, including review of consultants reports, laboratory and other test data, was performed as part of comprehensive evaluation and provision of chronic care management services.   SDOH:  (Social Determinants of Health) assessments and interventions performed: No  SDOH Screenings   Alcohol Screen: Not on file  Depression (PHQ2-9): Low Risk    PHQ-2 Score: 0  Financial Resource Strain: Not on file  Food Insecurity: Not on file  Housing: Not on file  Physical Activity: Not on file  Social Connections: Not on file  Stress: Not on file  Tobacco Use: Low Risk    Smoking Tobacco Use: Never   Smokeless Tobacco Use: Never  Transportation Needs: Not on file    CCM Care Plan  Allergies  Allergen Reactions   Augmentin [Amoxicillin-Pot Clavulanate] Other (See Comments)    Extreme heartburn   Beta Adrenergic Blockers     REACTION: reaction not known   Cefuroxime Axetil Hives    Throat closes   Cetirizine Hcl     REACTION: dizzy   Fexofenadine     REACTION: dizzy   Prednisone     Mood changer with higher doses     Medications Reviewed Today      Reviewed by [name], MD (Physician) on 10/24/20 at [place] List Status: <None>   Medication Order Taking? Sig Documenting Provider Last Dose Status Informant  acetaminophen (TYLENOL) 650 MG CR tablet [id number] Yes Take 650 mg by mouth every 8 (eight) hours as needed for pain. [provider] Taking Active Self  amLODipine (NORVASC) 5 MG tablet [id number] Yes Take 1  tablet (5 mg total) by mouth daily. [name], MD Taking Active   Cholecalciferol (VITAMIN D) 2000 units tablet [id number] Yes Take 2,000 Units by mouth daily. 40,000 units daily (takes 2 pills of 20,000 units) [provider] Taking Active   cyclobenzaprine (FLEXERIL) 10 MG tablet [id number] Yes Take 1 tablet (10 mg total) by mouth 3 (three) times daily as needed for muscle spasms. [name], MD Taking Active   fluticasone Asencion Islam) 50 MCG/ACT nasal spray [id number] Yes Place 1 spray into both nostrils daily. [provider] Taking Active Self  Glucosamine-Chondroit-Vit C-Mn (GLUCOSAMINE 1500 COMPLEX PO) [id number] Yes Take 2 tablets by mouth once.  [provider] Taking Active Self  ibuprofen (ADVIL) 200 MG tablet [id number] Yes Take 200 mg by mouth as needed. [provider] Taking Active Self  levothyroxine (SYNTHROID) 112 MCG tablet [id number] Yes Take 1 tablet (112 mcg total) by mouth daily. [name], MD Taking Active   loratadine (CLARITIN) 10 MG tablet [id number] Yes Take 10 mg by mouth daily. [provider] Taking Active Self  meclizine (ANTIVERT) 25 MG tablet [id number] Yes Take 1 tablet (25 mg total) by mouth every 4 (four) hours as needed for dizziness. [name], MD Taking Active   meloxicam [facility]) 15 MG tablet [id number] Yes Take 1 tablet (15 mg total) by mouth daily. [name], MD Taking Active   Multiple Vitamin (MULTIVITAMIN) capsule [id number] Yes Take 1 capsule by mouth daily.   [provider] Taking Active   Probiotic Product (ALIGN  PO) [id number] Yes Take by mouth daily. [provider] Taking Active   trolamine salicylate (ASPERCREME) 10 % cream [id number] Yes Apply 1 application topically as needed for muscle pain. [provider] Taking Active Self  TURMERIC PO [id number] Yes Take 800 mg by mouth daily. [provider] Taking Active Self  valACYclovir (VALTREX) 1000 MG tablet [id number] Yes Take 1 tablet (1,000 mg total) by mouth daily. Take for 5 days. [name], CNM Taking Active   vitamin E 180 MG (400 UNITS) capsule [id number] Yes Take 400 Units by mouth daily. [provider] Taking Active Self  Zinc 100 MG TABS [id number] Yes Take 1 tablet by mouth daily. 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   TENSION HEADACHE 10/21/2006   Migraine headache 10/21/2006   SYNDROME, CARPAL TUNNEL 10/21/2006   FIBROCYSTIC BREAST DISEASE 10/21/2006   Osteopenia 10/21/2006    Immunization History  Administered Date(s) Administered   Fluad Quad(high Dose 65+) 03/24/2019, 02/02/2020   Influenza Whole 02/19/2005, 01/27/2007, 01/14/2008   Influenza, High Dose Seasonal PF 04/01/2015, 01/10/2016, 02/06/2017, 02/17/2018   Influenza,inj,Quad PF,6+ Mos 02/20/2013, 03/01/2014   Pneumococcal Conjugate-13 06/01/2015   Pneumococcal Polysaccharide-23 03/01/2014   Td 02/14/2001   Tdap 08/29/2010   Zoster, Live 12/15/2010    Conditions to be addressed/monitored:  Hypertension, Hyperlipidemia, Hypothyroidism, Osteoarthritis and Vitamin D  deficiency  Conditions addressed this visit: Hypertension, hyperlipidemia   Care Plan : [place]  Updates made by [name], [facility]  since 02/01/2021 12:00 AM     Problem: Problem: Hypertension, Hyperlipidemia, Hypothyroidism, Osteoarthritis and Vitamin D deficiency      Long-Range Goal: Patient-Specific Goal   Start Date: 08/03/2020  Expected End Date: 08/03/2021  Recent Progress: On track  Priority: High  Note:   Current Barriers:  Unable to achieve control of cholesterol  Unable to maintain control of blood pressure  Pharmacist Clinical Goal(s):  Patient will achieve adherence to monitoring guidelines and medication adherence to achieve therapeutic efficacy through collaboration with PharmD and provider.   Interventions: 1:1 collaboration with [name], MD regarding development and update of comprehensive plan of care as evidenced by provider attestation and co-signature Inter-disciplinary care team collaboration (see longitudinal plan of care) Comprehensive medication review performed; medication list updated in electronic medical record  Hypertension (BP goal <140/90) -Controlled -Current treatment: Amlodipine 39m, 1 tablet once daily -Medications previously tried: none  -Current home readings: 134/80, 136/72 (135-140/<80) - maybe once a month -Current dietary habits: did not discuss -Current exercise habits: gardening and going on walks; she states she tracks her steps: 3000-4000 steps most day - not walking as much because the road became gravel -Reports hypotensive/hypertensive symptoms -Educated on BP goals and benefits of medications for prevention of heart attack, stroke and kidney damage; Daily salt intake goal < 2300 mg; Importance of home blood pressure monitoring; Proper BP monitoring technique; -Counseled to monitor BP at home weekly, document, and provide log at future appointments -Counseled on diet and exercise  extensively Recommended to continue current medication Recommended for patient to purchase an arm BP cuff and check weekly  Hyperlipidemia: (LDL goal < 100) -Uncontrolled -Current treatment: No medications -Medications previously tried: none  -Current dietary patterns: conscious of diet; eats fried chicken once every 2 months and mostly grills food; eats salmon once a week and bacon once a week; eating lots of vegetables and oatmeal; uses olive oil; uses butter and doesn't like margarine -Current exercise habits: doesn't exercise in the winter, tries to garden a lot, good day 4-5,000 steps a day, goes fishing; doesn't go the [employer]-Educated on Cholesterol goals;  Benefits of statin for ASCVD risk reduction; Importance of limiting foods high in cholesterol; Exercise goal of 150 minutes per week; -Recommended statin therapy pending results of next lipid panel  Hypothyroidism (Goal: TSH 0.35-4.5) -Controlled -Current treatment  levothyroxine 1171m, 1 tablet once daily  -Medications previously tried: none  -Counseled on taking on an empty stomach with water  Osteoarthritis (Goal: minimize pain) -Controlled -Current treatment  Meloxicam (Mobic) 1567m1 tablet once daily (patient reports using as needed; she can take it a couple of days and then get off it. Never a whole week) Tylenol arthritis, takes as needed Ibuprofen 200m27matient reported not taking on same days she takes meloxicam) Aspercreme 10% cream, apply as needed for muscle pain (patient reports using couple of times in a week on joints) -Medications previously tried: none  -Recommended to continue current medication Recommended limiting use of NSAIDs due to kidneys and risk for bleeding  Osteopenia (Goal prevent fractures) -Controlled -Last DEXA Scan: 2017   T-Score femoral neck: -1.8, -1.3  T-Score total hip: n/a  T-Score lumbar spine: -1.4  T-Score forearm radius: n/a  10-year probability of major osteoporotic  fracture: 10.2%  10-year probability of hip fracture: 1.5% -Patient is not a candidate for pharmacologic treatment -Current treatment  Multivitamin (50 mg of calcium) 1 tablet daily Cholecalciferol (vitamin D) 2000 units, 1 tablet once daily  Calcium carbonate  600 mg daily -Medications previously tried: none  -Recommend [phone] units of vitamin D daily. Recommend 1200 mg of calcium daily from dietary and supplemental sources. Recommend weight-bearing and muscle strengthening exercises for building and maintaining bone density. -Recommended to continue current medication  Vitamin D deficiency (Goal: 30-100) -Controlled -Current treatment  Cholecalciferol (vitamin D) 2000 units, 1 tablet once daily  -Medications previously tried: none  -Recommended to continue current medication  Health Maintenance -Vaccine gaps: shingrix, COVID -Current therapy:  Glucosamine 1540m complex, 2 tablets once daily  Multivitamin, 1 tablet once daily Fluticasone (Flonase) nasal spray, 1 spray into both nostrils daily Turmeric 8078m 1 tablet once daily Vitamin E 400 units, 1 capsule once daily (not taking every day) Zinc 50 mg 1 tablet daily -Educated on Cost vs benefit of each product must be carefully weighed by individual consumer Supplements may interfere with prescription drugs -Patient is satisfied with current therapy and denies issues -Recommended to continue current medication  Patient Goals/Self-Care Activities Patient will:  - take medications as prescribed check blood pressure weekly, document, and provide at future appointments target a minimum of 150 minutes of moderate intensity exercise weekly  Follow Up Plan: Telephone follow up appointment with care management team member scheduled for: 9 months        Medication Assistance: None required.  Patient affirms current coverage meets needs.  Compliance/Adherence/Medication fill history: Care Gaps: AWV - office aware to  schedule COVID vaccines - Overdue Zoster vaccine - Overdue TDAP - Overdue Flu vaccine - Overdue BP- 130/86  Star-Rating Drugs: None  Patient's preferred pharmacy is:  [place]: [phone]: [phone] [address]: [phone]: [address]: [phone]: [phone] pill box? No - Patient states she turns bottles upside down when she takes them.  Pt endorses 99% compliance  We discussed: Current pharmacy is preferred with insurance plan and patient is satisfied with pharmacy services Patient decided to: Continue current medication management strategy  Care Plan and Follow Up Patient Decision:  Patient agrees to Care Plan and Follow-up.  Plan: Telephone follow up appointment with care management team member scheduled for:  9 months  MaJeni SallesPharmD BCGarden Cityharmacist [phone]

## 2021-02-06 ENCOUNTER — Ambulatory Visit: Payer: Medicare HMO | Admitting: Family Medicine

## 2021-02-13 DIAGNOSIS — E7800 Pure hypercholesterolemia, unspecified: Secondary | ICD-10-CM | POA: Diagnosis not present

## 2021-02-13 DIAGNOSIS — I10 Essential (primary) hypertension: Secondary | ICD-10-CM

## 2021-02-17 ENCOUNTER — Ambulatory Visit
Admission: RE | Admit: 2021-02-17 | Discharge: 2021-02-17 | Disposition: A | Discharge status: Home / Self Care | Payer: Medicare HMO | Source: Ambulatory Visit | Attending: Family Medicine | Admitting: Family Medicine

## 2021-02-17 ENCOUNTER — Other Ambulatory Visit: Payer: Self-pay

## 2021-02-17 DIAGNOSIS — Z1231 Encounter for screening mammogram for malignant neoplasm of breast: Secondary | ICD-10-CM

## 2021-02-22 ENCOUNTER — Other Ambulatory Visit: Payer: Self-pay | Admitting: Family Medicine

## 2021-02-22 DIAGNOSIS — R928 Other abnormal and inconclusive findings on diagnostic imaging of breast: Secondary | ICD-10-CM

## 2021-02-23 DIAGNOSIS — M5451 Vertebrogenic low back pain: Secondary | ICD-10-CM | POA: Diagnosis not present

## 2021-02-23 DIAGNOSIS — M5416 Radiculopathy, lumbar region: Secondary | ICD-10-CM | POA: Diagnosis not present

## 2021-03-14 DIAGNOSIS — M5416 Radiculopathy, lumbar region: Secondary | ICD-10-CM | POA: Diagnosis not present

## 2021-03-21 ENCOUNTER — Ambulatory Visit: Payer: Medicare HMO

## 2021-03-21 ENCOUNTER — Other Ambulatory Visit: Payer: Self-pay

## 2021-03-21 ENCOUNTER — Ambulatory Visit
Admission: RE | Admit: 2021-03-21 | Discharge: 2021-03-21 | Disposition: A | Discharge status: Home / Self Care | Payer: Medicare HMO | Source: Ambulatory Visit | Attending: Family Medicine | Admitting: Family Medicine

## 2021-03-21 DIAGNOSIS — R928 Other abnormal and inconclusive findings on diagnostic imaging of breast: Secondary | ICD-10-CM

## 2021-03-21 DIAGNOSIS — R922 Inconclusive mammogram: Secondary | ICD-10-CM | POA: Diagnosis not present

## 2021-03-23 ENCOUNTER — Other Ambulatory Visit: Payer: Self-pay

## 2021-03-23 ENCOUNTER — Ambulatory Visit (INDEPENDENT_AMBULATORY_CARE_PROVIDER_SITE_OTHER): Payer: Medicare HMO

## 2021-03-23 DIAGNOSIS — Z23 Encounter for immunization: Secondary | ICD-10-CM | POA: Diagnosis not present

## 2021-03-30 DIAGNOSIS — M5459 Other low back pain: Secondary | ICD-10-CM | POA: Diagnosis not present

## 2021-04-14 DIAGNOSIS — M545 Low back pain, unspecified: Secondary | ICD-10-CM | POA: Diagnosis not present

## 2021-04-23 ENCOUNTER — Encounter: Payer: Self-pay | Admitting: Gastroenterology

## 2021-04-28 DIAGNOSIS — H5213 Myopia, bilateral: Secondary | ICD-10-CM | POA: Diagnosis not present

## 2021-04-28 DIAGNOSIS — H04123 Dry eye syndrome of bilateral lacrimal glands: Secondary | ICD-10-CM | POA: Diagnosis not present

## 2021-04-28 DIAGNOSIS — H52223 Regular astigmatism, bilateral: Secondary | ICD-10-CM | POA: Diagnosis not present

## 2021-04-28 DIAGNOSIS — H2513 Age-related nuclear cataract, bilateral: Secondary | ICD-10-CM | POA: Diagnosis not present

## 2021-04-28 DIAGNOSIS — H524 Presbyopia: Secondary | ICD-10-CM | POA: Diagnosis not present

## 2021-04-28 DIAGNOSIS — H1045 Other chronic allergic conjunctivitis: Secondary | ICD-10-CM | POA: Diagnosis not present

## 2021-04-28 DIAGNOSIS — H43813 Vitreous degeneration, bilateral: Secondary | ICD-10-CM | POA: Diagnosis not present

## 2021-05-04 DIAGNOSIS — M5416 Radiculopathy, lumbar region: Secondary | ICD-10-CM | POA: Diagnosis not present

## 2021-05-15 DIAGNOSIS — M5416 Radiculopathy, lumbar region: Secondary | ICD-10-CM | POA: Diagnosis not present

## 2021-05-18 ENCOUNTER — Encounter: Payer: Self-pay | Admitting: Gastroenterology

## 2021-05-29 DIAGNOSIS — L821 Other seborrheic keratosis: Secondary | ICD-10-CM | POA: Diagnosis not present

## 2021-06-02 DIAGNOSIS — M5416 Radiculopathy, lumbar region: Secondary | ICD-10-CM | POA: Diagnosis not present

## 2021-06-07 ENCOUNTER — Telehealth: Payer: Self-pay | Admitting: Pharmacist

## 2021-06-07 DIAGNOSIS — M5136 Other intervertebral disc degeneration, lumbar region: Secondary | ICD-10-CM | POA: Diagnosis not present

## 2021-06-07 DIAGNOSIS — M5451 Vertebrogenic low back pain: Secondary | ICD-10-CM | POA: Diagnosis not present

## 2021-06-07 NOTE — Chronic Care Management (AMB) (Signed)
Chronic Care Management Pharmacy Assistant   Name: Meghan Dixon  MRN: [id number] DOB: [birth year]  Reason for Encounter: Disease State / Hypertension Assessment Call   Conditions to be addressed/monitored: HTN  Recent office visits:  None  Recent consult visits:  None  Hospital visits:  None  Medications: Outpatient Encounter Medications as of 06/07/2021  Medication Sig   acetaminophen (TYLENOL) 650 MG CR tablet Take 650 mg by mouth every 8 (eight) hours as needed for pain.   amLODipine (NORVASC) 5 MG tablet Take 1 tablet (5 mg total) by mouth daily.   calcium carbonate (OSCAL) 1500 (600 Ca) MG TABS tablet Take by mouth daily with breakfast.   Cholecalciferol (VITAMIN D) 2000 units tablet Take 2,000 Units by mouth daily. 40,000 units daily (takes 2 pills of 20,000 units)   cyclobenzaprine (FLEXERIL) 10 MG tablet Take 1 tablet (10 mg total) by mouth 3 (three) times daily as needed for muscle spasms.   fluticasone (FLONASE) 50 MCG/ACT nasal spray Place 1 spray into both nostrils daily.   Glucosamine-Chondroit-Vit C-Mn (GLUCOSAMINE 1500 COMPLEX PO) Take 2 tablets by mouth once.    ibuprofen (ADVIL) 200 MG tablet Take 200 mg by mouth as needed.   levothyroxine (SYNTHROID) 112 MCG tablet Take 1 tablet (112 mcg total) by mouth daily.   loratadine (CLARITIN) 10 MG tablet Take 10 mg by mouth daily.   meclizine (ANTIVERT) 25 MG tablet Take 1 tablet (25 mg total) by mouth every 4 (four) hours as needed for dizziness.   meloxicam (MOBIC) 15 MG tablet Take 1 tablet (15 mg total) by mouth daily.   Multiple Vitamin (MULTIVITAMIN) capsule Take 1 capsule by mouth daily.     Probiotic Product (ALIGN PO) Take by mouth daily.   trolamine salicylate (ASPERCREME) 10 % cream Apply 1 application topically as needed for muscle pain.   TURMERIC PO Take 800 mg by mouth daily.   valACYclovir (VALTREX) 1000 MG tablet Take 1 tablet (1,000 mg total) by mouth daily. Take for 5 days.   vitamin E 180 MG  (400 UNITS) capsule Take 400 Units by mouth daily as needed.   Zinc 100 MG TABS Take 1 tablet by mouth daily.   No facility-administered encounter medications on file as of 06/07/2021.  Fill History: levothyroxine 112 mcg tablet 05/30/2021 90   meloxicam 15 mg tablet 12/21/2020 90   amlodipine 5 mg tablet 05/30/2021 90   valacyclovir 1 gram tablet 03/15/2021 30   Reviewed chart prior to disease state call. Spoke with patient regarding BP  Recent Office Vitals: BP Readings from Last 3 Encounters:  10/24/20 130/86  02/15/20 (!) 148/80  02/02/20 140/80   Pulse Readings from Last 3 Encounters:  10/24/20 78  02/15/20 94  02/02/20 95    Wt Readings from Last 3 Encounters:  10/24/20 173 lb (78.5 kg)  02/15/20 175 lb (79.4 kg)  02/02/20 174 lb 3.2 oz (79 kg)     Kidney Function Lab Results  Component Value Date/Time   CREATININE 0.73 10/24/2020 11:09 AM   CREATININE 0.72 10/23/2019 09:37 AM   CREATININE 0.68 07/10/2019 08:53 AM   GFR 81.90 10/24/2020 11:09 AM   GFRNONAA 90 (L) 05/06/2011 07:57 AM   GFRAA >90 05/06/2011 07:57 AM    BMP Latest Ref Rng & Units 10/24/2020 10/23/2019 07/10/2019  Glucose 70 - 99 mg/dL 95 98 100(H)  BUN 6 - 23 mg/dL 15 17 12   Creatinine 0.40 - 1.20 mg/dL 0.73 0.72 0.68  BUN/Creat Ratio 6 -  22 (calc) - NOT APPLICABLE -  Sodium 962 - 145 mEq/L 142 141 141  Potassium 3.5 - 5.1 mEq/L 4.0 4.5 4.2  Chloride 96 - 112 mEq/L 104 104 106  CO2 19 - 32 mEq/L 29 29 29   Calcium 8.4 - 10.5 mg/dL 9.5 9.2 9.3    Current antihypertensive regimen:  Amlodipine 5 mg daily  How often are you checking your Blood Pressure?   Current home BP readings:   What recent interventions/DTPs have been made by any provider to improve Blood Pressure control since last CPP Visit: No recent interventions  Any recent hospitalizations or ED visits since last visit with CPP? No recent hospital visits  What diet changes have been made to improve Blood Pressure Control?   What  exercise is being done to improve your Blood Pressure Control?    Adherence Review: Is the patient currently on ACE/ARB medication? No Does the patient have >5 day gap between last estimated fill dates? No  Note: Patient states she does not want to be bothered with phone calls between her appointments with Jeni Salles, she states she is just fine and doesn't have time for this.   Care Gaps: AWV - message sent to Ramond Craver Last BP - 130/86 on 10/24/2020 Covid - never done Shingrix - never done TDAP - overdue Colonoscopy - overdue  Star Rating Drugs: None  [facility] Pharmacist Assistant [phone]

## 2021-06-14 DIAGNOSIS — M79604 Pain in right leg: Secondary | ICD-10-CM | POA: Diagnosis not present

## 2021-06-15 DIAGNOSIS — M2560 Stiffness of unspecified joint, not elsewhere classified: Secondary | ICD-10-CM | POA: Diagnosis not present

## 2021-06-15 DIAGNOSIS — M545 Low back pain, unspecified: Secondary | ICD-10-CM | POA: Diagnosis not present

## 2021-06-23 DIAGNOSIS — H25043 Posterior subcapsular polar age-related cataract, bilateral: Secondary | ICD-10-CM | POA: Diagnosis not present

## 2021-06-23 DIAGNOSIS — H25013 Cortical age-related cataract, bilateral: Secondary | ICD-10-CM | POA: Diagnosis not present

## 2021-06-23 DIAGNOSIS — H2513 Age-related nuclear cataract, bilateral: Secondary | ICD-10-CM | POA: Diagnosis not present

## 2021-06-23 DIAGNOSIS — H18413 Arcus senilis, bilateral: Secondary | ICD-10-CM | POA: Diagnosis not present

## 2021-06-23 DIAGNOSIS — H2512 Age-related nuclear cataract, left eye: Secondary | ICD-10-CM | POA: Diagnosis not present

## 2021-06-26 DIAGNOSIS — M5136 Other intervertebral disc degeneration, lumbar region: Secondary | ICD-10-CM | POA: Diagnosis not present

## 2021-06-26 DIAGNOSIS — M5451 Vertebrogenic low back pain: Secondary | ICD-10-CM | POA: Diagnosis not present

## 2021-07-04 ENCOUNTER — Encounter: Payer: Medicare HMO | Admitting: Gastroenterology

## 2021-07-05 DIAGNOSIS — M545 Low back pain, unspecified: Secondary | ICD-10-CM | POA: Diagnosis not present

## 2021-07-05 DIAGNOSIS — M2560 Stiffness of unspecified joint, not elsewhere classified: Secondary | ICD-10-CM | POA: Diagnosis not present

## 2021-07-10 DIAGNOSIS — M2560 Stiffness of unspecified joint, not elsewhere classified: Secondary | ICD-10-CM | POA: Diagnosis not present

## 2021-07-10 DIAGNOSIS — M545 Low back pain, unspecified: Secondary | ICD-10-CM | POA: Diagnosis not present

## 2021-07-12 DIAGNOSIS — M2560 Stiffness of unspecified joint, not elsewhere classified: Secondary | ICD-10-CM | POA: Diagnosis not present

## 2021-07-12 DIAGNOSIS — M545 Low back pain, unspecified: Secondary | ICD-10-CM | POA: Diagnosis not present

## 2021-07-21 DIAGNOSIS — H2511 Age-related nuclear cataract, right eye: Secondary | ICD-10-CM | POA: Diagnosis not present

## 2021-07-21 DIAGNOSIS — H25012 Cortical age-related cataract, left eye: Secondary | ICD-10-CM | POA: Diagnosis not present

## 2021-07-21 DIAGNOSIS — H2512 Age-related nuclear cataract, left eye: Secondary | ICD-10-CM | POA: Diagnosis not present

## 2021-08-07 DIAGNOSIS — H2511 Age-related nuclear cataract, right eye: Secondary | ICD-10-CM | POA: Diagnosis not present

## 2021-08-07 DIAGNOSIS — H25011 Cortical age-related cataract, right eye: Secondary | ICD-10-CM | POA: Diagnosis not present

## 2021-08-08 ENCOUNTER — Encounter: Payer: Medicare HMO | Admitting: Gastroenterology

## 2021-09-14 DIAGNOSIS — Z0100 Encounter for examination of eyes and vision without abnormal findings: Secondary | ICD-10-CM | POA: Diagnosis not present

## 2021-09-14 DIAGNOSIS — Z961 Presence of intraocular lens: Secondary | ICD-10-CM | POA: Diagnosis not present

## 2021-10-03 DIAGNOSIS — H43813 Vitreous degeneration, bilateral: Secondary | ICD-10-CM | POA: Diagnosis not present

## 2021-10-23 ENCOUNTER — Telehealth: Payer: Medicare HMO

## 2021-10-27 ENCOUNTER — Ambulatory Visit (INDEPENDENT_AMBULATORY_CARE_PROVIDER_SITE_OTHER): Payer: Medicare HMO | Admitting: Family Medicine

## 2021-10-27 ENCOUNTER — Encounter: Payer: Self-pay | Admitting: Gastroenterology

## 2021-10-27 ENCOUNTER — Encounter: Payer: Self-pay | Admitting: Family Medicine

## 2021-10-27 ENCOUNTER — Telehealth: Payer: Self-pay | Admitting: Family Medicine

## 2021-10-27 VITALS — BP 122/78 | HR 70 | Temp 98.0°F | Ht 63.5 in | Wt 175.4 lb

## 2021-10-27 DIAGNOSIS — E039 Hypothyroidism, unspecified: Secondary | ICD-10-CM

## 2021-10-27 DIAGNOSIS — E559 Vitamin D deficiency, unspecified: Secondary | ICD-10-CM | POA: Diagnosis not present

## 2021-10-27 DIAGNOSIS — G43909 Migraine, unspecified, not intractable, without status migrainosus: Secondary | ICD-10-CM | POA: Diagnosis not present

## 2021-10-27 DIAGNOSIS — H8110 Benign paroxysmal vertigo, unspecified ear: Secondary | ICD-10-CM

## 2021-10-27 DIAGNOSIS — E7800 Pure hypercholesterolemia, unspecified: Secondary | ICD-10-CM | POA: Diagnosis not present

## 2021-10-27 DIAGNOSIS — R739 Hyperglycemia, unspecified: Secondary | ICD-10-CM | POA: Diagnosis not present

## 2021-10-27 DIAGNOSIS — M8580 Other specified disorders of bone density and structure, unspecified site: Secondary | ICD-10-CM | POA: Diagnosis not present

## 2021-10-27 DIAGNOSIS — M545 Low back pain, unspecified: Secondary | ICD-10-CM | POA: Diagnosis not present

## 2021-10-27 DIAGNOSIS — G8929 Other chronic pain: Secondary | ICD-10-CM

## 2021-10-27 DIAGNOSIS — I10 Essential (primary) hypertension: Secondary | ICD-10-CM | POA: Diagnosis not present

## 2021-10-27 LAB — T4, FREE: Free T4: 1.07 ng/dL (ref 0.60–1.60)

## 2021-10-27 LAB — CBC WITH DIFFERENTIAL/PLATELET
Basophils Absolute: 0 10*3/uL (ref 0.0–0.1)
Basophils Relative: 0.7 % (ref 0.0–3.0)
Eosinophils Absolute: 0.2 10*3/uL (ref 0.0–0.7)
Eosinophils Relative: 2.7 % (ref 0.0–5.0)
HCT: 41.2 % (ref 36.0–46.0)
Hemoglobin: 13.4 g/dL (ref 12.0–15.0)
Lymphocytes Relative: 23.9 % (ref 12.0–46.0)
Lymphs Abs: 1.5 10*3/uL (ref 0.7–4.0)
MCHC: 32.6 g/dL (ref 30.0–36.0)
MCV: 93.1 fl (ref 78.0–100.0)
Monocytes Absolute: 0.7 10*3/uL (ref 0.1–1.0)
Monocytes Relative: 10.7 % (ref 3.0–12.0)
Neutro Abs: 3.9 10*3/uL (ref 1.4–7.7)
Neutrophils Relative %: 62 % (ref 43.0–77.0)
Platelets: 240 10*3/uL (ref 150.0–400.0)
RBC: 4.43 Mil/uL (ref 3.87–5.11)
RDW: 13.5 % (ref 11.5–15.5)
WBC: 6.3 10*3/uL (ref 4.0–10.5)

## 2021-10-27 LAB — HEPATIC FUNCTION PANEL
ALT: 17 U/L (ref 0–35)
AST: 21 U/L (ref 0–37)
Albumin: 4.5 g/dL (ref 3.5–5.2)
Alkaline Phosphatase: 60 U/L (ref 39–117)
Bilirubin, Direct: 0.1 mg/dL (ref 0.0–0.3)
Total Bilirubin: 0.5 mg/dL (ref 0.2–1.2)
Total Protein: 7.4 g/dL (ref 6.0–8.3)

## 2021-10-27 LAB — LIPID PANEL
Cholesterol: 195 mg/dL (ref 0–200)
HDL: 60.9 mg/dL (ref >39.00)
LDL Cholesterol: 119 mg/dL — ABNORMAL HIGH (ref 0–99)
NonHDL: 134.3
Total CHOL/HDL Ratio: 3
Triglycerides: 78 mg/dL (ref 0.0–149.0)
VLDL: 15.6 mg/dL (ref 0.0–40.0)

## 2021-10-27 LAB — TSH: TSH: 4.49 u[IU]/mL (ref 0.35–5.50)

## 2021-10-27 LAB — BASIC METABOLIC PANEL
BUN: 14 mg/dL (ref 6–23)
CO2: 29 mEq/L (ref 19–32)
Calcium: 9.7 mg/dL (ref 8.4–10.5)
Chloride: 105 mEq/L (ref 96–112)
Creatinine, Ser: 0.76 mg/dL (ref 0.40–1.20)
GFR: 77.49 mL/min (ref >60.00)
Glucose, Bld: 102 mg/dL — ABNORMAL HIGH (ref 70–99)
Potassium: 4.9 mEq/L (ref 3.5–5.1)
Sodium: 142 mEq/L (ref 135–145)

## 2021-10-27 LAB — T3, FREE: T3, Free: 3 pg/mL (ref 2.3–4.2)

## 2021-10-27 LAB — VITAMIN D 25 HYDROXY (VIT D DEFICIENCY, FRACTURES): VITD: 44.4 ng/mL (ref 30.00–100.00)

## 2021-10-27 LAB — HEMOGLOBIN A1C: Hgb A1c MFr Bld: 5.8 % (ref 4.6–6.5)

## 2021-10-27 MED ORDER — MELOXICAM 15 MG PO TABS: 15.0000 mg | ORAL_TABLET | Freq: Every day | ORAL | 3 refills | Status: AC

## 2021-10-27 MED ORDER — AMLODIPINE BESYLATE 5 MG PO TABS: 5.0000 mg | ORAL_TABLET | Freq: Every day | ORAL | 3 refills | Status: DC

## 2021-10-27 MED ORDER — LEVOTHYROXINE SODIUM 112 MCG PO TABS
112.0000 ug | ORAL_TABLET | Freq: Every day | ORAL | 3 refills | Status: DC
Start: 2021-10-27 — End: 2022-10-22

## 2021-10-27 NOTE — Progress Notes (Signed)
Subjective:    Patient ID: Meghan Dixon, female    DOB: 02/26/1946, 76 y.o.   MRN: [id number]  HPI Here to follow up on issues. She feels well in general. She had bilateral cataract surgeries a few months ago, and because of this she missed her colonoscopy. Her last DEXA was in 2017. Her BP at home has been well controlled. She continues to get PT for her low back pain per Dr. Veverly Fells. Her vertigo and her migraines have been stable.    Review of Systems  Constitutional: Negative.   HENT: Negative.    Eyes: Negative.   Respiratory: Negative.    Cardiovascular: Negative.   Gastrointestinal: Negative.   Genitourinary:  Negative for decreased urine volume, difficulty urinating, dyspareunia, dysuria, enuresis, flank pain, frequency, hematuria, pelvic pain and urgency.  Musculoskeletal:  Positive for back pain.  Skin: Negative.   Neurological:  Positive for dizziness and headaches.  Psychiatric/Behavioral: Negative.         Objective:   Physical Exam Constitutional:      General: She is not in acute distress.    Appearance: Normal appearance. She is well-developed.  HENT:     Head: Normocephalic and atraumatic.     Right Ear: External ear normal.     Left Ear: External ear normal.     Nose: Nose normal.     Mouth/Throat:     Pharynx: No oropharyngeal exudate.  Eyes:     General: No scleral icterus.    Conjunctiva/sclera: Conjunctivae normal.     Pupils: Pupils are equal, round, and reactive to light.  Neck:     Thyroid: No thyromegaly.     Vascular: No JVD.  Cardiovascular:     Rate and Rhythm: Normal rate and regular rhythm.     Heart sounds: Normal heart sounds. No murmur heard.    No friction rub. No gallop.  Pulmonary:     Effort: Pulmonary effort is normal. No respiratory distress.     Breath sounds: Normal breath sounds. No wheezing or rales.  Chest:     Chest wall: No tenderness.  Abdominal:     General: Bowel sounds are normal. There is no distension.      Palpations: Abdomen is soft. There is no mass.     Tenderness: There is no abdominal tenderness. There is no guarding or rebound.  Musculoskeletal:        General: No tenderness. Normal range of motion.     Cervical back: Normal range of motion and neck supple.  Lymphadenopathy:     Cervical: No cervical adenopathy.  Skin:    General: Skin is warm and dry.     Findings: No erythema or rash.  Neurological:     Mental Status: She is alert and oriented to person, place, and time.     Cranial Nerves: No cranial nerve deficit.     Motor: No abnormal muscle tone.     Coordination: Coordination normal.     Deep Tendon Reflexes: Reflexes are normal and symmetric. Reflexes normal.  Psychiatric:        Behavior: Behavior normal.        Thought Content: Thought content normal.        Judgment: Judgment normal.           Assessment & Plan:  Her HTN is stable. Her migraines and vertigo are stable. She will follow up with Dr. Veverly Fells for the back pain. She will contact Dr. Ardis Hughs' office to reschedule her  colonoscopy. We will set up another DEXA for the osteopenia. Get fasting labs to check e thyroid panel, lipids, etc. We spent a total of (32   ) minutes reviewing records and discussing these issues.  [name], MD

## 2021-10-27 NOTE — Telephone Encounter (Signed)
Patient would like two copies of her physical appointment and two copies of the lab results for insurance purposes. She would like a call to pick them up next week.      Good callback number is [phone]   Please advise

## 2021-11-01 NOTE — Telephone Encounter (Signed)
Spoke with the patient and informed her copies were left at the front desk for pick up.  Also advised the patient in the future, she can come to the front desk to sign a medical records release to request her records.

## 2021-11-01 NOTE — Telephone Encounter (Signed)
Pt called to FU on copies of visit for 10/27/2021.  Please call when copies are ready for pickup.  [phone]

## 2021-11-08 ENCOUNTER — Ambulatory Visit (INDEPENDENT_AMBULATORY_CARE_PROVIDER_SITE_OTHER)
Admission: RE | Admit: 2021-11-08 | Discharge: 2021-11-08 | Disposition: A | Discharge status: Home / Self Care | Payer: Medicare HMO | Source: Ambulatory Visit | Attending: Family Medicine | Admitting: Family Medicine

## 2021-11-08 DIAGNOSIS — M8580 Other specified disorders of bone density and structure, unspecified site: Secondary | ICD-10-CM | POA: Diagnosis not present

## 2021-11-09 DIAGNOSIS — H04123 Dry eye syndrome of bilateral lacrimal glands: Secondary | ICD-10-CM | POA: Diagnosis not present

## 2021-11-09 DIAGNOSIS — H43813 Vitreous degeneration, bilateral: Secondary | ICD-10-CM | POA: Diagnosis not present

## 2021-11-09 DIAGNOSIS — H1045 Other chronic allergic conjunctivitis: Secondary | ICD-10-CM | POA: Diagnosis not present

## 2021-11-21 ENCOUNTER — Ambulatory Visit (AMBULATORY_SURGERY_CENTER): Payer: Self-pay

## 2021-11-21 VITALS — Ht 63.5 in | Wt 178.0 lb

## 2021-11-21 DIAGNOSIS — M5451 Vertebrogenic low back pain: Secondary | ICD-10-CM | POA: Diagnosis not present

## 2021-11-21 DIAGNOSIS — Z1211 Encounter for screening for malignant neoplasm of colon: Secondary | ICD-10-CM

## 2021-11-21 MED ORDER — NA SULFATE-K SULFATE-MG SULF 17.5-3.13-1.6 GM/177ML PO SOLN: 1.0000 | ORAL | 0 refills | Status: DC

## 2021-11-21 NOTE — Progress Notes (Signed)
No egg or soy allergy known to patient  No issues known to pt with past sedation with any surgeries or procedures Patient denies ever being told they had issues or difficulty with intubation  No FH of Malignant Hyperthermia Pt is not on diet pills Pt is not on  home 02  Pt is not on blood thinners  Pt denies issues with constipation  No A fib or A flutter Have any cardiac testing pending--denied Pt instructed to use [employer] or GoodRx for a price reduction on prep   

## 2021-11-30 DIAGNOSIS — M5451 Vertebrogenic low back pain: Secondary | ICD-10-CM | POA: Diagnosis not present

## 2021-12-07 DIAGNOSIS — M5451 Vertebrogenic low back pain: Secondary | ICD-10-CM | POA: Diagnosis not present

## 2021-12-11 ENCOUNTER — Encounter: Payer: Medicare HMO | Admitting: Gastroenterology

## 2021-12-11 ENCOUNTER — Encounter: Payer: Self-pay | Admitting: Gastroenterology

## 2021-12-15 DIAGNOSIS — M5451 Vertebrogenic low back pain: Secondary | ICD-10-CM | POA: Diagnosis not present

## 2021-12-19 DIAGNOSIS — M5451 Vertebrogenic low back pain: Secondary | ICD-10-CM | POA: Diagnosis not present

## 2021-12-20 ENCOUNTER — Telehealth: Payer: Self-pay | Admitting: Gastroenterology

## 2021-12-20 NOTE — Telephone Encounter (Signed)
Inbound call from patient inquiring about her diet prior to her upcoming procedure. Patient is also inquiring if she is able to continue her probiotics. Please give a call to further advise.  Thank you

## 2021-12-20 NOTE — Telephone Encounter (Signed)
Spoke with the patient answered all of her prep questions at this time. She will call back if needed.

## 2021-12-26 ENCOUNTER — Ambulatory Visit (AMBULATORY_SURGERY_CENTER): Payer: Medicare HMO | Admitting: Gastroenterology

## 2021-12-26 ENCOUNTER — Encounter: Payer: Self-pay | Admitting: Gastroenterology

## 2021-12-26 VITALS — BP 141/76 | HR 68 | Temp 96.8°F | Resp 16 | Ht 63.5 in | Wt 178.2 lb

## 2021-12-26 DIAGNOSIS — D123 Benign neoplasm of transverse colon: Secondary | ICD-10-CM | POA: Diagnosis not present

## 2021-12-26 DIAGNOSIS — D122 Benign neoplasm of ascending colon: Secondary | ICD-10-CM | POA: Diagnosis not present

## 2021-12-26 DIAGNOSIS — I10 Essential (primary) hypertension: Secondary | ICD-10-CM | POA: Diagnosis not present

## 2021-12-26 DIAGNOSIS — D120 Benign neoplasm of cecum: Secondary | ICD-10-CM

## 2021-12-26 DIAGNOSIS — Z1211 Encounter for screening for malignant neoplasm of colon: Secondary | ICD-10-CM | POA: Diagnosis not present

## 2021-12-26 HISTORY — PX: COLONOSCOPY: SHX174

## 2021-12-26 MED ORDER — SODIUM CHLORIDE 0.9 % IV SOLN: 500.0000 mL | Freq: Once | INTRAVENOUS | Status: DC

## 2021-12-26 NOTE — Progress Notes (Signed)
Referring Provider: [name], MD Primary Care Physician:  [name], MD  Indication for Procedure:  Colon cancer Surveillance   IMPRESSION:  Need for colon cancer surveillance Appropriate candidate for monitored anesthesia care  PLAN: Colonoscopy in the [facility] today   HPI: Meghan Dixon is a 76 y.o. female presents for surveillance colonoscopy.  Prior endoscopic history: - Colonoscopy 2003 reportedly normal except for hemorrhoids - Colonoscopy 2013 with Dr. Ardis Hughs: Left sided diverticulosis   Brother with colon cancer at age 21. No other known family history of colon cancer or polyps. No family history of uterine/endometrial cancer, pancreatic cancer or gastric/stomach cancer.   Past Medical History:  Diagnosis Date   Allergy    allergic rhinitis   Arthritis    foot   Cataract    COVID-19 virus infection 12/2019   HSV infection    Hypertension    Hyperthyroidism    Osteopenia     Past Surgical History:  Procedure Laterality Date   ABDOMINAL HYSTERECTOMY     for bleeding and ovarian cyst   APPENDECTOMY     CARPAL TUNNEL RELEASE     left   COLONOSCOPY  04/30/2011   per Dr. Ardis Hughs, no polyps, repeat in 10 yrs    ESOPHAGOGASTRODUODENOSCOPY  04/30/2011   per Dr. Ardis Hughs, gastritis, H pylori neg    FOOT SURGERY     bilateral   KNEE ARTHROSCOPY W/ MENISCAL REPAIR Right 2014   TONSILLECTOMY      Current Outpatient Medications  Medication Sig Dispense Refill   amLODipine (NORVASC) 5 MG tablet Take 1 tablet (5 mg total) by mouth daily. 90 tablet 3   calcium carbonate (OSCAL) 1500 (600 Ca) MG TABS tablet Take by mouth daily with breakfast.     Cholecalciferol (VITAMIN D) 2000 units tablet Take 2,000 Units by mouth daily. 40,000 units daily (takes 2 pills of 20,000 units)     Glucosamine-Chondroit-Vit C-Mn (GLUCOSAMINE 1500 COMPLEX PO) Take 2 tablets by mouth once.      levothyroxine (SYNTHROID) 112 MCG tablet Take 1 tablet (112 mcg total) by mouth daily.  90 tablet 3   Multiple Vitamin (MULTIVITAMIN) capsule Take 1 capsule by mouth daily.       Probiotic Product (ALIGN PO) Take by mouth daily.     TURMERIC PO Take 800 mg by mouth daily.     acetaminophen (TYLENOL) 650 MG CR tablet Take 650 mg by mouth every 8 (eight) hours as needed for pain.     ibuprofen (ADVIL) 200 MG tablet Take 200 mg by mouth as needed.     loratadine (CLARITIN) 10 MG tablet Take 10 mg by mouth daily.     meclizine (ANTIVERT) 25 MG tablet Take 1 tablet (25 mg total) by mouth every 4 (four) hours as needed for dizziness. 60 tablet 2   meloxicam (MOBIC) 15 MG tablet Take 1 tablet (15 mg total) by mouth daily. 90 tablet 3   trolamine salicylate (ASPERCREME) 10 % cream Apply 1 application topically as needed for muscle pain.     valACYclovir (VALTREX) 1000 MG tablet Take 1 tablet (1,000 mg total) by mouth daily. Take for 5 days. 30 tablet 3   vitamin E 180 MG (400 UNITS) capsule Take 400 Units by mouth daily as needed.     Zinc 100 MG TABS Take 1 tablet by mouth daily.     Current Facility-Administered Medications  Medication Dose Route Frequency Provider Last Rate Last Admin   0.9 %  sodium  chloride infusion  500 mL Intravenous Once Thornton Park, MD        Allergies as of 12/26/2021 - Review Complete 12/26/2021  Allergen Reaction Noted   Augmentin [amoxicillin-pot clavulanate] Other (See Comments) 10/15/2013   Beta adrenergic blockers  10/21/2006   Cefuroxime axetil Hives 10/21/2006   Cetirizine hcl  10/21/2006   Fexofenadine  10/21/2006   Maxi-tuss cd [phenyleph-chlorphen-codeine] Other (See Comments) 12/26/2021   Prednisone  10/21/2006   Topiramate Other (See Comments) 12/26/2021    Family History  Problem Relation Age of Onset   Heart disease Mother    Esophageal cancer Father    Cancer Father        esophageal CA caused death at 61   Alcohol abuse Father    Arthritis Sister        RA   Heart disease Brother        CAD   Colon cancer Brother 42    Lung cancer Brother    Breast cancer Cousin    Stomach cancer Neg Hx    Colon polyps Neg Hx    Rectal cancer Neg Hx      Physical Exam: General:   Alert,  well-nourished, pleasant and cooperative in NAD Head:  Normocephalic and atraumatic. Eyes:  Sclera clear, no icterus.   Conjunctiva pink. Mouth:  No deformity or lesions.   Neck:  Supple; no masses or thyromegaly. Lungs:  Clear throughout to auscultation.   No wheezes. Heart:  Regular rate and rhythm; no murmurs. Abdomen:  Soft, non-tender, nondistended, normal bowel sounds, no rebound or guarding.  Msk:  Symmetrical. No boney deformities LAD: No inguinal or umbilical LAD Extremities:  No clubbing or edema. Neurologic:  Alert and  oriented x4;  grossly nonfocal Skin:  No obvious rash or bruise. Psych:  Alert and cooperative. Normal mood and affect.     Studies/Results: No results found.    Aimar Borghi L. Tarri Glenn, MD, MPH 12/26/2021, 9:34 AM

## 2021-12-26 NOTE — Patient Instructions (Addendum)
Handouts provided about hemorrhoids, diverticulosis, high fiber diet and polyps.   Continue present medications.  Resume previous diet, high fiber diet recommended.  Await pathology results.  Repeat colonoscopy not recommended for surveillance due to age.    YOU HAD AN ENDOSCOPIC PROCEDURE TODAY AT [facility]:   Refer to the procedure report that was given to you for any specific questions about what was found during the examination.  If the procedure report does not answer your questions, please call your gastroenterologist to clarify.  If you requested that your care partner not be given the details of your procedure findings, then the procedure report has been included in a sealed envelope for you to review at your convenience later.  YOU SHOULD EXPECT: Some feelings of bloating in the abdomen. Passage of more gas than usual.  Walking can help get rid of the air that was put into your GI tract during the procedure and reduce the bloating. If you had a lower endoscopy (such as a colonoscopy or flexible sigmoidoscopy) you may notice spotting of blood in your stool or on the toilet paper. If you underwent a bowel prep for your procedure, you may not have a normal bowel movement for a few days.  Please Note:  You might notice some irritation and congestion in your nose or some drainage.  This is from the oxygen used during your procedure.  There is no need for concern and it should clear up in a day or so.  SYMPTOMS TO REPORT IMMEDIATELY:  Following lower endoscopy (colonoscopy or flexible sigmoidoscopy):  Excessive amounts of blood in the stool  Significant tenderness or worsening of abdominal pains  Swelling of the abdomen that is new, acute  Fever of 100F or higher   For urgent or emergent issues, a gastroenterologist can be reached at any hour by calling [phone]. Do not use MyChart messaging for urgent concerns.    DIET:  We do recommend a small meal at first, but  then you may proceed to your regular diet.  Drink plenty of fluids but you should avoid alcoholic beverages for 24 hours.  ACTIVITY:  You should plan to take it easy for the rest of today and you should NOT DRIVE or use heavy machinery until tomorrow (because of the sedation medicines used during the test).    FOLLOW UP: Our staff will call the number listed on your records the next business day following your procedure.  We will call around 7:15- 8:00 am to check on you and address any questions or concerns that you may have regarding the information given to you following your procedure. If we do not reach you, we will leave a message.     If any biopsies were taken you will be contacted by phone or by letter within the next 1-3 weeks.  Please call us at [phone] if you have not heard about the biopsies in 3 weeks.    SIGNATURES/CONFIDENTIALITY: You and/or your care partner have signed paperwork which will be entered into your electronic medical record.  These signatures attest to the fact that that the information above on your After Visit Summary has been reviewed and is understood.  Full responsibility of the confidentiality of this discharge information lies with you and/or your care-partner.

## 2021-12-26 NOTE — Op Note (Signed)
[facility] Patient Name: Meghan Dixon Procedure Date: 12/26/2021 9:34 AM MRN: [id number] Endoscopist: Thornton Park MD, MD Age: 76 Referring MD:  Date of Birth: Sep 25, 1945 Gender: Female Account #: [id number] Procedure:                Colonoscopy Indications:              Screening for colorectal malignant neoplasm                           No polyps on prior colonoscopies in 2003 and 2013 Medicines:                Monitored Anesthesia Care Procedure:                Pre-Anesthesia Assessment:                           - Prior to the procedure, a History and Physical                            was performed, and patient medications and                            allergies were reviewed. The patient's tolerance of                            previous anesthesia was also reviewed. The risks                            and benefits of the procedure and the sedation                            options and risks were discussed with the patient.                            All questions were answered, and informed consent                            was obtained. Prior Anticoagulants: The patient has                            taken no previous anticoagulant or antiplatelet                            agents. ASA Grade Assessment: II - A patient with                            mild systemic disease. After reviewing the risks                            and benefits, the patient was deemed in                            satisfactory condition to undergo the procedure.  After obtaining informed consent, the colonoscope                            was passed under direct vision. Throughout the                            procedure, the patient's blood pressure, pulse, and                            oxygen saturations were monitored continuously. The                            CF [id number] #[id number] was introduced through the anus                            and advanced  to the 3 cm into the ileum. A second                            forward view of the right colon was performed. The                            colonoscopy was performed without difficulty. The                            patient tolerated the procedure well. The quality                            of the bowel preparation was good. The terminal                            ileum, ileocecal valve, appendiceal orifice, and                            rectum were photographed. Scope In: 9:42:39 AM Scope Out: 10:02:31 AM Scope Withdrawal Time: 0 hours 15 minutes 25 seconds  Total Procedure Duration: 0 hours 19 minutes 52 seconds  Findings:                 The perianal and digital rectal examinations were                            normal.                           Non-bleeding internal hemorrhoids were found.                           Multiple small and large-mouthed diverticula were                            found in the sigmoid colon, descending colon and                            ascending colon.  Five sessile polyps were found in the transverse                            colon, ascending colon and cecum. The polyps were 2                            to 3 mm in size. These polyps were removed with a                            cold snare. Resection and retrieval were complete.                            Estimated blood loss was minimal.                           The exam was otherwise without abnormality on                            direct and retroflexion views except for internal                            hemorrhoids. Complications:            No immediate complications. Estimated Blood Loss:     Estimated blood loss was minimal. Impression:               - Non-bleeding internal hemorrhoids.                           - Diverticulosis in the sigmoid colon, in the                            descending colon and in the ascending colon.                           - Five  2 to 3 mm polyps in the transverse colon, in                            the ascending colon and in the cecum, removed with                            a cold snare. Resected and retrieved.                           - The examination was otherwise normal on direct                            and retroflexion views. Recommendation:           - Patient has a contact number available for                            emergencies. The signs and symptoms of potential  delayed complications were discussed with the                            patient. Return to normal activities tomorrow.                            Written discharge instructions were provided to the                            patient.                           - Resume previous diet. High fiber diet recommended.                           - Continue present medications.                           - Await pathology results.                           - Repeat colonoscopy is not recommended for                            surveillance due to age.                           - Emerging evidence supports eating a diet of                            fruits, vegetables, grains, calcium, and yogurt                            while reducing red meat and alcohol may reduce the                            risk of colon cancer.                           - Thank you for allowing me to be involved in your                            colon cancer prevention. Thornton Park MD, MD 12/26/2021 10:08:05 AM This report has been signed electronically.

## 2021-12-26 NOTE — Progress Notes (Signed)
Report to PACU, RN, vss, BBS= Clear.  

## 2021-12-26 NOTE — Progress Notes (Signed)
Pt's states no medical or surgical changes since previsit or office visit. 

## 2021-12-26 NOTE — Progress Notes (Signed)
Called to room to assist during endoscopic procedure.  Patient ID and intended procedure confirmed with present staff. Received instructions for my participation in the procedure from the performing physician.  

## 2021-12-27 ENCOUNTER — Telehealth: Payer: Self-pay

## 2021-12-27 NOTE — Telephone Encounter (Signed)
Left message on follow up call. 

## 2021-12-31 ENCOUNTER — Encounter: Payer: Self-pay | Admitting: Gastroenterology

## 2022-01-01 DIAGNOSIS — M5451 Vertebrogenic low back pain: Secondary | ICD-10-CM | POA: Diagnosis not present

## 2022-01-08 DIAGNOSIS — M5451 Vertebrogenic low back pain: Secondary | ICD-10-CM | POA: Diagnosis not present

## 2022-01-17 DIAGNOSIS — M5451 Vertebrogenic low back pain: Secondary | ICD-10-CM | POA: Diagnosis not present

## 2022-01-19 DIAGNOSIS — M5451 Vertebrogenic low back pain: Secondary | ICD-10-CM | POA: Diagnosis not present

## 2022-01-23 DIAGNOSIS — M5451 Vertebrogenic low back pain: Secondary | ICD-10-CM | POA: Diagnosis not present

## 2022-02-13 DIAGNOSIS — R87810 Cervical high risk human papillomavirus (HPV) DNA test positive: Secondary | ICD-10-CM | POA: Diagnosis not present

## 2022-02-13 DIAGNOSIS — Z6831 Body mass index (BMI) 31.0-31.9, adult: Secondary | ICD-10-CM | POA: Diagnosis not present

## 2022-02-13 DIAGNOSIS — Z1272 Encounter for screening for malignant neoplasm of vagina: Secondary | ICD-10-CM | POA: Diagnosis not present

## 2022-02-13 DIAGNOSIS — Z124 Encounter for screening for malignant neoplasm of cervix: Secondary | ICD-10-CM | POA: Diagnosis not present

## 2022-02-21 ENCOUNTER — Other Ambulatory Visit: Payer: Self-pay | Admitting: Family Medicine

## 2022-02-21 DIAGNOSIS — Z1231 Encounter for screening mammogram for malignant neoplasm of breast: Secondary | ICD-10-CM

## 2022-03-12 DIAGNOSIS — R87610 Atypical squamous cells of undetermined significance on cytologic smear of cervix (ASC-US): Secondary | ICD-10-CM | POA: Diagnosis not present

## 2022-03-12 DIAGNOSIS — N870 Mild cervical dysplasia: Secondary | ICD-10-CM | POA: Diagnosis not present

## 2022-04-20 ENCOUNTER — Ambulatory Visit (INDEPENDENT_AMBULATORY_CARE_PROVIDER_SITE_OTHER): Payer: Medicare HMO | Admitting: Family

## 2022-04-20 ENCOUNTER — Encounter: Payer: Self-pay | Admitting: Family

## 2022-04-20 ENCOUNTER — Other Ambulatory Visit: Payer: Self-pay

## 2022-04-20 ENCOUNTER — Telehealth: Payer: Self-pay | Admitting: Family Medicine

## 2022-04-20 VITALS — BP 137/85 | HR 72 | Temp 98.6°F | Ht 63.5 in | Wt 179.4 lb

## 2022-04-20 DIAGNOSIS — U071 COVID-19: Secondary | ICD-10-CM

## 2022-04-20 LAB — POCT INFLUENZA A/B
Influenza A, POC: NEGATIVE
Influenza B, POC: NEGATIVE

## 2022-04-20 LAB — POC COVID19 BINAXNOW: SARS Coronavirus 2 Ag: POSITIVE — AB

## 2022-04-20 MED ORDER — NIRMATRELVIR/RITONAVIR (PAXLOVID)TABLET: 3.0000 | ORAL_TABLET | Freq: Two times a day (BID) | ORAL | 0 refills | Status: AC

## 2022-04-20 MED ORDER — BENZONATATE 100 MG PO CAPS: 100.0000 mg | ORAL_CAPSULE | Freq: Three times a day (TID) | ORAL | 0 refills | Status: AC | PRN

## 2022-04-20 MED ORDER — NIRMATRELVIR/RITONAVIR (PAXLOVID)TABLET: 3.0000 | ORAL_TABLET | Freq: Two times a day (BID) | ORAL | 0 refills | Status: DC

## 2022-04-20 NOTE — Progress Notes (Signed)
Patient ID: Meghan Dixon, female    DOB: Dec 10, 1945, 77 y.o.   MRN: [id number]  Chief Complaint  Patient presents with   Sinus Problem    Pt co Dry cough, stiff neck/ migraine and headache since Monday. Pt states when she coughs she get dizzy. Has tried mucinex and Advil sinus which did not help symptoms.     HPI:     URI sx:  Pt co Dry cough, stiff neck/ migraine and headache since Monday. Pt states when she coughs she get dizzy. Reports fever on first day, has not had covid vaccines Has tried mucinex and Advil sinus which did not help symptoms.       Assessment & Plan:  1. COVID-19 - rapid flu neg. pt has had sx for 4d, advised antiviral may not provide sx relief, pt would like to try. Also sending Tessalon pearles, advised on use & SE of both meds. Advised on isolation for 5d of sx and then 5d wearing a mask in public unless testing negative at home. OK to continue OTC sinus meds, recommend 1-2 Aleve (has at home) bid for headache, sinus pressure, saline nasal spray and humidifier overnight.  - POC COVID-19 - POCT Influenza A/B - nirmatrelvir/ritonavir (PAXLOVID) 20 x 150 MG & 10 x '100MG'$  TABS; Take 3 tablets by mouth 2 (two) times daily for 5 days. (Take nirmatrelvir 150 mg two tablets twice daily for 5 days and ritonavir 100 mg one tablet twice daily for 5 days)  Dispense: 30 tablet; Refill: 0 - benzonatate (TESSALON) 100 MG capsule; Take 1-2 capsules (100-200 mg total) by mouth 3 (three) times daily as needed for up to 10 days for cough.  Dispense: 30 capsule; Refill: 0   Subjective:    Outpatient Medications Prior to Visit  Medication Sig Dispense Refill   acetaminophen (TYLENOL) 650 MG CR tablet Take 650 mg by mouth every 8 (eight) hours as needed for pain.     amLODipine (NORVASC) 5 MG tablet Take 1 tablet (5 mg total) by mouth daily. 90 tablet 3   calcium carbonate (OSCAL) 1500 (600 Ca) MG TABS tablet Take by mouth daily with breakfast.     Cholecalciferol (VITAMIN D) 2000  units tablet Take 2,000 Units by mouth daily. 40,000 units daily (takes 2 pills of 20,000 units)     Glucosamine-Chondroit-Vit C-Mn (GLUCOSAMINE 1500 COMPLEX PO) Take 2 tablets by mouth once.      ibuprofen (ADVIL) 200 MG tablet Take 200 mg by mouth as needed.     levothyroxine (SYNTHROID) 112 MCG tablet Take 1 tablet (112 mcg total) by mouth daily. 90 tablet 3   loratadine (CLARITIN) 10 MG tablet Take 10 mg by mouth daily.     meclizine (ANTIVERT) 25 MG tablet Take 1 tablet (25 mg total) by mouth every 4 (four) hours as needed for dizziness. 60 tablet 2   meloxicam (MOBIC) 15 MG tablet Take 1 tablet (15 mg total) by mouth daily. 90 tablet 3   Multiple Vitamin (MULTIVITAMIN) capsule Take 1 capsule by mouth daily.       Probiotic Product (ALIGN PO) Take by mouth daily.     trolamine salicylate (ASPERCREME) 10 % cream Apply 1 application topically as needed for muscle pain.     TURMERIC PO Take 800 mg by mouth daily.     valACYclovir (VALTREX) 1000 MG tablet Take 1 tablet (1,000 mg total) by mouth daily. Take for 5 days. 30 tablet 3   vitamin E 180 MG (400  UNITS) capsule Take 400 Units by mouth daily as needed.     Zinc 100 MG TABS Take 1 tablet by mouth daily.     No facility-administered medications prior to visit.   Past Medical History:  Diagnosis Date   Allergy    allergic rhinitis   Arthritis    foot   Cataract    COVID-19 virus infection 12/2019   HSV infection    Hypertension    Hyperthyroidism    Osteopenia    Past Surgical History:  Procedure Laterality Date   ABDOMINAL HYSTERECTOMY     for bleeding and ovarian cyst   APPENDECTOMY     CARPAL TUNNEL RELEASE     left   COLONOSCOPY  04/30/2011   per Dr. Ardis Hughs, no polyps, repeat in 10 yrs    ESOPHAGOGASTRODUODENOSCOPY  04/30/2011   per Dr. Ardis Hughs, gastritis, H pylori neg    FOOT SURGERY     bilateral   KNEE ARTHROSCOPY W/ MENISCAL REPAIR Right 2014   TONSILLECTOMY     Allergies  Allergen Reactions   Augmentin  [Amoxicillin-Pot Clavulanate] Other (See Comments)    Extreme heartburn   Beta Adrenergic Blockers     REACTION: reaction not known   Cefuroxime Axetil Hives    Throat closes   Cetirizine Hcl     REACTION: dizzy   Fexofenadine     REACTION: dizzy   Maxi-Tuss Cd [Phenyleph-Chlorphen-Codeine] Other (See Comments)    "Felt like I was unable to wake up" med-chlorpheniamine/hydrocodone   Prednisone     Mood changer with higher doses    Topiramate Other (See Comments)    "Felt like I couldn't wake up"      Objective:    Physical Exam Vitals and nursing note reviewed.  Constitutional:      Appearance: Normal appearance. She is ill-appearing.     Interventions: Face mask in place.  HENT:     Right Ear: Tympanic membrane and ear canal normal.     Left Ear: Tympanic membrane and ear canal normal.     Nose:     Right Sinus: Frontal sinus tenderness present.     Left Sinus: Frontal sinus tenderness present.     Mouth/Throat:     Mouth: Mucous membranes are moist.     Pharynx: Posterior oropharyngeal erythema present. No pharyngeal swelling, oropharyngeal exudate or uvula swelling.     Tonsils: No tonsillar exudate or tonsillar abscesses.  Cardiovascular:     Rate and Rhythm: Normal rate and regular rhythm.  Pulmonary:     Effort: Pulmonary effort is normal.     Breath sounds: Normal breath sounds.  Musculoskeletal:        General: Normal range of motion.  Lymphadenopathy:     Head:     Right side of head: No preauricular or posterior auricular adenopathy.     Left side of head: No preauricular or posterior auricular adenopathy.     Cervical: No cervical adenopathy.  Skin:    General: Skin is warm and dry.  Neurological:     Mental Status: She is alert.  Psychiatric:        Mood and Affect: Mood normal.        Behavior: Behavior normal.    BP 137/85 (BP Location: Left Arm, Patient Position: Sitting, Cuff Size: Large)   Pulse 72   Temp 98.6 F (37 C) (Temporal)   Ht 5'  3.5" (1.613 m)   Wt 179 lb 6 oz (81.4 kg)  SpO2 98%   BMI 31.28 kg/m  Wt Readings from Last 3 Encounters:  04/20/22 179 lb 6 oz (81.4 kg)  12/26/21 178 lb 3.2 oz (80.8 kg)  11/21/21 178 lb (80.7 kg)       Jeanie Sewer, NP

## 2022-04-20 NOTE — Telephone Encounter (Signed)
I called pt ,Pt stated she would like RX sent to [facility] instead. I contacted [facility] to make sure They have Paxlovid In stock and they do. RX resent.

## 2022-04-20 NOTE — Telephone Encounter (Signed)
Pharmacist states: -Paxlovid is unavailable at their pharmacy; they aren't able to get it in [place] sending to a bigger pharmacy [facility] or [facility]  - They can fill the tessalon  I informed pt of pharmacist's message. Stated she would like medication sent to [facility] at [place], [place] [address]

## 2022-04-25 ENCOUNTER — Ambulatory Visit
Admission: RE | Admit: 2022-04-25 | Discharge: 2022-04-25 | Disposition: A | Discharge status: Home / Self Care | Payer: Medicare HMO | Source: Ambulatory Visit | Attending: Family Medicine | Admitting: Family Medicine

## 2022-04-25 DIAGNOSIS — Z1231 Encounter for screening mammogram for malignant neoplasm of breast: Secondary | ICD-10-CM

## 2022-04-26 ENCOUNTER — Other Ambulatory Visit: Payer: Self-pay | Admitting: Family Medicine

## 2022-04-26 DIAGNOSIS — R928 Other abnormal and inconclusive findings on diagnostic imaging of breast: Secondary | ICD-10-CM

## 2022-05-04 ENCOUNTER — Ambulatory Visit
Admission: RE | Admit: 2022-05-04 | Discharge: 2022-05-04 | Disposition: A | Discharge status: Home / Self Care | Payer: Medicare HMO | Source: Ambulatory Visit | Attending: Family Medicine | Admitting: Family Medicine

## 2022-05-04 DIAGNOSIS — R928 Other abnormal and inconclusive findings on diagnostic imaging of breast: Secondary | ICD-10-CM | POA: Diagnosis not present

## 2022-05-04 DIAGNOSIS — N6489 Other specified disorders of breast: Secondary | ICD-10-CM | POA: Diagnosis not present

## 2022-05-31 ENCOUNTER — Telehealth: Payer: Self-pay

## 2022-05-31 NOTE — Telephone Encounter (Signed)
Pt/spouse Summary visit notes was mailed as requested

## 2022-07-16 ENCOUNTER — Encounter: Payer: Self-pay | Admitting: Family Medicine

## 2022-07-16 ENCOUNTER — Ambulatory Visit (INDEPENDENT_AMBULATORY_CARE_PROVIDER_SITE_OTHER): Payer: Medicare HMO | Admitting: Family Medicine

## 2022-07-16 VITALS — BP 132/78 | HR 88 | Temp 98.1°F | Wt 177.6 lb

## 2022-07-16 DIAGNOSIS — H538 Other visual disturbances: Secondary | ICD-10-CM | POA: Diagnosis not present

## 2022-07-16 DIAGNOSIS — B351 Tinea unguium: Secondary | ICD-10-CM | POA: Insufficient documentation

## 2022-07-16 MED ORDER — TERBINAFINE HCL 250 MG PO TABS: 250.0000 mg | ORAL_TABLET | Freq: Every day | ORAL | 1 refills | Status: DC

## 2022-07-16 NOTE — Progress Notes (Signed)
   Subjective:    Patient ID: Meghan Dixon, female    DOB: 26-Aug-1945, 77 y.o.   MRN: [id number]  HPI Here for several issues. First she asks for a referral to see Dr. Tama High at [facility] for blurred vision in both eyes. She saw her optometrist but he says she needs to see a specialist. 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    Review of Systems  Constitutional: Negative.   HENT: Negative.    Eyes:  Positive for visual disturbance.  Respiratory: Negative.    Cardiovascular: Negative.        Objective:   Physical Exam Constitutional:      Appearance: Normal appearance.  Eyes:     Extraocular Movements: Extraocular movements intact.     Conjunctiva/sclera: Conjunctivae normal.     Pupils: Pupils are equal, round, and reactive to light.  Cardiovascular:     Rate and Rhythm: Normal rate.     Pulses: Normal pulses.     Heart sounds: Normal heart sounds.  Pulmonary:     Effort: Pulmonary effort is normal.     Breath sounds: Normal breath sounds.  Skin:    Comments: All ten toenails show fungal involvement.   Neurological:     Mental Status: She is alert.           Assessment & Plan:  For the blurred vision, we did the referral to Dr. Manuella Ghazi as above. For the toenail fungus, she will try Terbinafine 250 mg daily.  [name], MD

## 2022-07-25 DIAGNOSIS — H43813 Vitreous degeneration, bilateral: Secondary | ICD-10-CM | POA: Diagnosis not present

## 2022-07-25 DIAGNOSIS — H26493 Other secondary cataract, bilateral: Secondary | ICD-10-CM | POA: Diagnosis not present

## 2022-08-29 DIAGNOSIS — H52223 Regular astigmatism, bilateral: Secondary | ICD-10-CM | POA: Diagnosis not present

## 2022-08-29 DIAGNOSIS — H04123 Dry eye syndrome of bilateral lacrimal glands: Secondary | ICD-10-CM | POA: Diagnosis not present

## 2022-08-29 DIAGNOSIS — Z961 Presence of intraocular lens: Secondary | ICD-10-CM | POA: Diagnosis not present

## 2022-08-29 DIAGNOSIS — H5203 Hypermetropia, bilateral: Secondary | ICD-10-CM | POA: Diagnosis not present

## 2022-08-29 DIAGNOSIS — H43813 Vitreous degeneration, bilateral: Secondary | ICD-10-CM | POA: Diagnosis not present

## 2022-08-29 DIAGNOSIS — H524 Presbyopia: Secondary | ICD-10-CM | POA: Diagnosis not present

## 2022-09-11 DIAGNOSIS — R87612 Low grade squamous intraepithelial lesion on cytologic smear of cervix (LGSIL): Secondary | ICD-10-CM | POA: Diagnosis not present

## 2022-09-11 DIAGNOSIS — R87620 Atypical squamous cells of undetermined significance on cytologic smear of vagina (ASC-US): Secondary | ICD-10-CM | POA: Diagnosis not present

## 2022-09-11 DIAGNOSIS — R87811 Vaginal high risk human papillomavirus (HPV) DNA test positive: Secondary | ICD-10-CM | POA: Diagnosis not present

## 2022-10-15 ENCOUNTER — Ambulatory Visit (INDEPENDENT_AMBULATORY_CARE_PROVIDER_SITE_OTHER): Payer: Medicare HMO | Admitting: Family Medicine

## 2022-10-15 ENCOUNTER — Encounter: Payer: Self-pay | Admitting: Family Medicine

## 2022-10-15 VITALS — BP 124/64 | HR 75 | Temp 98.1°F | Wt 178.0 lb

## 2022-10-15 DIAGNOSIS — E039 Hypothyroidism, unspecified: Secondary | ICD-10-CM

## 2022-10-15 DIAGNOSIS — E538 Deficiency of other specified B group vitamins: Secondary | ICD-10-CM

## 2022-10-15 DIAGNOSIS — R739 Hyperglycemia, unspecified: Secondary | ICD-10-CM

## 2022-10-15 DIAGNOSIS — R5383 Other fatigue: Secondary | ICD-10-CM | POA: Diagnosis not present

## 2022-10-15 DIAGNOSIS — E559 Vitamin D deficiency, unspecified: Secondary | ICD-10-CM | POA: Diagnosis not present

## 2022-10-15 DIAGNOSIS — I10 Essential (primary) hypertension: Secondary | ICD-10-CM | POA: Diagnosis not present

## 2022-10-15 LAB — CBC WITH DIFFERENTIAL/PLATELET
Basophils Absolute: 0 10*3/uL (ref 0.0–0.1)
Basophils Relative: 0.5 % (ref 0.0–3.0)
Eosinophils Absolute: 0.2 10*3/uL (ref 0.0–0.7)
Eosinophils Relative: 2.1 % (ref 0.0–5.0)
HCT: 42.6 % (ref 36.0–46.0)
Hemoglobin: 13.7 g/dL (ref 12.0–15.0)
Lymphocytes Relative: 22.1 % (ref 12.0–46.0)
Lymphs Abs: 1.8 10*3/uL (ref 0.7–4.0)
MCHC: 32.1 g/dL (ref 30.0–36.0)
MCV: 93.1 fl (ref 78.0–100.0)
Monocytes Absolute: 0.8 10*3/uL (ref 0.1–1.0)
Monocytes Relative: 9.2 % (ref 3.0–12.0)
Neutro Abs: 5.4 10*3/uL (ref 1.4–7.7)
Neutrophils Relative %: 66.1 % (ref 43.0–77.0)
Platelets: 257 10*3/uL (ref 150.0–400.0)
RBC: 4.57 Mil/uL (ref 3.87–5.11)
RDW: 13.5 % (ref 11.5–15.5)
WBC: 8.2 10*3/uL (ref 4.0–10.5)

## 2022-10-15 LAB — VITAMIN D 25 HYDROXY (VIT D DEFICIENCY, FRACTURES): VITD: 54.75 ng/mL (ref 30.00–100.00)

## 2022-10-15 LAB — BASIC METABOLIC PANEL
BUN: 15 mg/dL (ref 6–23)
CO2: 27 mEq/L (ref 19–32)
Calcium: 9.6 mg/dL (ref 8.4–10.5)
Chloride: 105 mEq/L (ref 96–112)
Creatinine, Ser: 0.74 mg/dL (ref 0.40–1.20)
GFR: 79.47 mL/min (ref >60.00)
Glucose, Bld: 89 mg/dL (ref 70–99)
Potassium: 3.7 mEq/L (ref 3.5–5.1)
Sodium: 142 mEq/L (ref 135–145)

## 2022-10-15 LAB — HEMOGLOBIN A1C: Hgb A1c MFr Bld: 5.7 % (ref 4.6–6.5)

## 2022-10-15 LAB — HEPATIC FUNCTION PANEL
ALT: 19 U/L (ref 0–35)
AST: 21 U/L (ref 0–37)
Albumin: 4.3 g/dL (ref 3.5–5.2)
Alkaline Phosphatase: 63 U/L (ref 39–117)
Bilirubin, Direct: 0.1 mg/dL (ref 0.0–0.3)
Total Bilirubin: 0.5 mg/dL (ref 0.2–1.2)
Total Protein: 7.5 g/dL (ref 6.0–8.3)

## 2022-10-15 LAB — VITAMIN B12: Vitamin B-12: 129 pg/mL — ABNORMAL LOW (ref 211–911)

## 2022-10-15 LAB — T3, FREE: T3, Free: 2.8 pg/mL (ref 2.3–4.2)

## 2022-10-15 LAB — T4, FREE: Free T4: 0.98 ng/dL (ref 0.60–1.60)

## 2022-10-15 LAB — TSH: TSH: 4.17 u[IU]/mL (ref 0.35–5.50)

## 2022-10-15 NOTE — Progress Notes (Signed)
   Subjective:    Patient ID: Meghan Dixon, female    DOB: [birth year], 77 y.o.   MRN: [id number]  HPI Here for a felling of fatigue for the past 4 weeks. No specific symptoms but she becomes very tired quickly when she tries to do things like clean house. No SOB or chest pain. Her weight has gone up a few pounds in the past few months.    Review of Systems  Constitutional:  Positive for fatigue.  Respiratory: Negative.    Cardiovascular: Negative.   Gastrointestinal: Negative.   Genitourinary: Negative.   Neurological: Negative.        Objective:   Physical Exam Constitutional:      Appearance: Normal appearance.  Cardiovascular:     Rate and Rhythm: Normal rate and regular rhythm.     Pulses: Normal pulses.     Heart sounds: Normal heart sounds.  Pulmonary:     Effort: Pulmonary effort is normal.     Breath sounds: Normal breath sounds.  Neurological:     General: No focal deficit present.     Mental Status: She is alert and oriented to person, place, and time.           Assessment & Plan:  Fatigue, we will check labs today to look for an etiology. I suspect we may need to adjust her Synthroid.  Gershon Crane, MD

## 2022-10-22 ENCOUNTER — Other Ambulatory Visit: Payer: Self-pay

## 2022-10-22 MED ORDER — LEVOTHYROXINE SODIUM 125 MCG PO TABS: 125.0000 ug | ORAL_TABLET | Freq: Every day | ORAL | 3 refills | Status: DC

## 2022-11-07 ENCOUNTER — Telehealth: Payer: Self-pay | Admitting: Family Medicine

## 2022-11-07 ENCOUNTER — Encounter: Payer: Self-pay | Admitting: Family Medicine

## 2022-11-07 ENCOUNTER — Other Ambulatory Visit: Payer: Self-pay

## 2022-11-07 ENCOUNTER — Ambulatory Visit (INDEPENDENT_AMBULATORY_CARE_PROVIDER_SITE_OTHER): Payer: Medicare HMO | Admitting: Family Medicine

## 2022-11-07 VITALS — BP 132/80 | HR 73 | Temp 98.3°F | Ht 64.5 in | Wt 178.8 lb

## 2022-11-07 DIAGNOSIS — E039 Hypothyroidism, unspecified: Secondary | ICD-10-CM | POA: Diagnosis not present

## 2022-11-07 DIAGNOSIS — E539 Vitamin B deficiency, unspecified: Secondary | ICD-10-CM | POA: Diagnosis not present

## 2022-11-07 DIAGNOSIS — M545 Low back pain, unspecified: Secondary | ICD-10-CM

## 2022-11-07 DIAGNOSIS — J3089 Other allergic rhinitis: Secondary | ICD-10-CM

## 2022-11-07 DIAGNOSIS — M8580 Other specified disorders of bone density and structure, unspecified site: Secondary | ICD-10-CM

## 2022-11-07 DIAGNOSIS — E7800 Pure hypercholesterolemia, unspecified: Secondary | ICD-10-CM

## 2022-11-07 DIAGNOSIS — N393 Stress incontinence (female) (male): Secondary | ICD-10-CM

## 2022-11-07 DIAGNOSIS — B009 Herpesviral infection, unspecified: Secondary | ICD-10-CM

## 2022-11-07 DIAGNOSIS — I10 Essential (primary) hypertension: Secondary | ICD-10-CM | POA: Diagnosis not present

## 2022-11-07 DIAGNOSIS — G8929 Other chronic pain: Secondary | ICD-10-CM

## 2022-11-07 DIAGNOSIS — E559 Vitamin D deficiency, unspecified: Secondary | ICD-10-CM

## 2022-11-07 LAB — LIPID PANEL
Cholesterol: 195 mg/dL (ref 0–200)
HDL: 56.4 mg/dL (ref >39.00)
LDL Cholesterol: 120 mg/dL — ABNORMAL HIGH (ref 0–99)
NonHDL: 138.79
Total CHOL/HDL Ratio: 3
Triglycerides: 96 mg/dL (ref 0.0–149.0)
VLDL: 19.2 mg/dL (ref 0.0–40.0)

## 2022-11-07 LAB — MAGNESIUM: Magnesium: 2.1 mg/dL (ref 1.5–2.5)

## 2022-11-07 MED ORDER — CYANOCOBALAMIN 1000 MCG/ML IJ SOLN
1000.0000 ug | Freq: Once | INTRAMUSCULAR | Status: AC
  Administered 2022-11-07: 1000 ug via INTRAMUSCULAR

## 2022-11-07 MED ORDER — NEEDLES & SYRINGES MISC: 1.0000 | 0 refills | Status: DC

## 2022-11-07 MED ORDER — CYANOCOBALAMIN 1000 MCG/ML IJ SOLN: 1000.0000 ug | INTRAMUSCULAR | 3 refills | Status: DC

## 2022-11-07 MED ORDER — CYANOCOBALAMIN 1000 MCG/ML IJ SOLN: 1000.0000 ug | INTRAMUSCULAR | 0 refills | Status: DC

## 2022-11-07 MED ORDER — MECLIZINE HCL 25 MG PO TABS: 25.0000 mg | ORAL_TABLET | ORAL | 2 refills | Status: DC | PRN

## 2022-11-07 MED ORDER — AMLODIPINE BESYLATE 5 MG PO TABS: 5.0000 mg | ORAL_TABLET | Freq: Every day | ORAL | 3 refills | Status: DC

## 2022-11-07 MED ORDER — PROMETHAZINE HCL 25 MG PO TABS: 25.0000 mg | ORAL_TABLET | Freq: Four times a day (QID) | ORAL | 3 refills | Status: AC | PRN

## 2022-11-07 MED ORDER — VALACYCLOVIR HCL 1 G PO TABS: 1000.0000 mg | ORAL_TABLET | Freq: Every day | ORAL | 3 refills | Status: DC

## 2022-11-07 NOTE — Telephone Encounter (Signed)
Meghan Dixon from [facility] call and want a call back need clarification on RX  that was sent in for B-12  for pt .

## 2022-11-07 NOTE — Addendum Note (Signed)
Addended by: Carola Rhine on: 11/07/2022 01:22 PM   Modules accepted: Orders

## 2022-11-07 NOTE — Progress Notes (Signed)
Subjective:    Patient ID: Meghan Dixon, female    DOB: June 20, 1945, 77 y.o.   MRN: [id number]  HPI Here to follow up on issues. We saw her on 10-15-22 with complaints of fatigue. Labs that day revealed a low B12 and a borderline low Tr, so we increased her Levothyroxine to 125 mcg daily and we started her on B12 shots. Her BP has been well controlled. She had some urinary incontinence in the past, but this resolved.    Review of Systems  Constitutional:  Positive for fatigue.  HENT: Negative.    Eyes: Negative.   Respiratory: Negative.    Cardiovascular: Negative.   Gastrointestinal: Negative.   Genitourinary:  Negative for decreased urine volume, difficulty urinating, dyspareunia, dysuria, enuresis, flank pain, frequency, hematuria, pelvic pain and urgency.  Musculoskeletal: Negative.   Skin: Negative.   Neurological: Negative.  Negative for headaches.  Psychiatric/Behavioral: Negative.         Objective:   Physical Exam Constitutional:      General: She is not in acute distress.    Appearance: Normal appearance. She is well-developed.  HENT:     Head: Normocephalic and atraumatic.     Right Ear: External ear normal.     Left Ear: External ear normal.     Nose: Nose normal.     Mouth/Throat:     Pharynx: No oropharyngeal exudate.  Eyes:     General: No scleral icterus.    Conjunctiva/sclera: Conjunctivae normal.     Pupils: Pupils are equal, round, and reactive to light.  Neck:     Thyroid: No thyromegaly.     Vascular: No JVD.  Cardiovascular:     Rate and Rhythm: Normal rate and regular rhythm.     Pulses: Normal pulses.     Heart sounds: Normal heart sounds. No murmur heard.    No friction rub. No gallop.  Pulmonary:     Effort: Pulmonary effort is normal. No respiratory distress.     Breath sounds: Normal breath sounds. No wheezing or rales.  Chest:     Chest wall: No tenderness.  Abdominal:     General: Bowel sounds are normal. There is no distension.      Palpations: Abdomen is soft. There is no mass.     Tenderness: There is no abdominal tenderness. There is no guarding or rebound.  Musculoskeletal:        General: No tenderness. Normal range of motion.     Cervical back: Normal range of motion and neck supple.  Lymphadenopathy:     Cervical: No cervical adenopathy.  Skin:    General: Skin is warm and dry.     Findings: No erythema or rash.  Neurological:     General: No focal deficit present.     Mental Status: She is alert and oriented to person, place, and time.     Cranial Nerves: No cranial nerve deficit.     Motor: No abnormal muscle tone.     Coordination: Coordination normal.     Deep Tendon Reflexes: Reflexes are normal and symmetric. Reflexes normal.  Psychiatric:        Mood and Affect: Mood normal.        Behavior: Behavior normal.        Thought Content: Thought content normal.        Judgment: Judgment normal.           Assessment & Plan:  Her HTN is well controlled. Her incontinence  has resolved. We recently increased her Levothyroxine dose, so we will recheck levels in a few months. We also recently started her on B12 shots, so we will check another level in 12 weeks. Get fasting labs for lipids. We spent a total of (35   ) minutes reviewing records and discussing these issues.  Gershon Crane, MD

## 2022-11-07 NOTE — Addendum Note (Signed)
Addended by: Carola Rhine on: 11/07/2022 11:36 AM   Modules accepted: Orders

## 2022-11-07 NOTE — Telephone Encounter (Signed)
This encounter has been resolved 

## 2022-11-07 NOTE — Telephone Encounter (Signed)
Rx was corrected and pt notified

## 2022-11-07 NOTE — Telephone Encounter (Signed)
Patient went to pharmacy to pick up vitamin D and needles that she says was discussed at her cpe today, says the pharmacy has no record.

## 2022-11-14 DIAGNOSIS — M170 Bilateral primary osteoarthritis of knee: Secondary | ICD-10-CM | POA: Diagnosis not present

## 2022-11-14 DIAGNOSIS — M545 Low back pain, unspecified: Secondary | ICD-10-CM | POA: Diagnosis not present

## 2022-11-16 ENCOUNTER — Telehealth: Payer: Self-pay | Admitting: Family Medicine

## 2022-11-16 NOTE — Telephone Encounter (Signed)
Pharmacy seeking clarification says prescription for meclizine (ANTIVERT) 25 MG tablet  exceeds daily recommended dose of 100mg 

## 2022-11-19 NOTE — Telephone Encounter (Signed)
Change the directions to one every 6 hours as needed

## 2022-11-20 MED ORDER — MECLIZINE HCL 25 MG PO TABS: 25.0000 mg | ORAL_TABLET | Freq: Four times a day (QID) | ORAL | 2 refills | Status: DC | PRN

## 2022-11-20 NOTE — Addendum Note (Signed)
Addended by: Kern Reap B on: 11/20/2022 09:09 AM   Modules accepted: Orders

## 2022-11-20 NOTE — Telephone Encounter (Signed)
Refill updated and sent

## 2022-11-27 ENCOUNTER — Telehealth: Payer: Self-pay | Admitting: Family Medicine

## 2022-11-27 NOTE — Telephone Encounter (Signed)
Pt states she received a letter from [facility] pharmacy stating that Dr. Clent Ridges needs to resubmit Meclizine and he needs to state a clarification on why the patient needs this medication so the pharmacy can fill it for her.    Pharmacy - [facility]

## 2022-11-29 MED ORDER — MECLIZINE HCL 25 MG PO TABS: 25.0000 mg | ORAL_TABLET | Freq: Four times a day (QID) | ORAL | 3 refills | Status: AC | PRN

## 2022-11-29 NOTE — Telephone Encounter (Signed)
Done (with a note saying this is to treat vertigo)

## 2022-11-29 NOTE — Telephone Encounter (Signed)
Spoke with pt aware that Dr Clent Ridges sent the reason for why pt is taking the RX.

## 2022-12-05 DIAGNOSIS — M5459 Other low back pain: Secondary | ICD-10-CM | POA: Diagnosis not present

## 2022-12-05 DIAGNOSIS — M170 Bilateral primary osteoarthritis of knee: Secondary | ICD-10-CM | POA: Diagnosis not present

## 2022-12-13 DIAGNOSIS — M170 Bilateral primary osteoarthritis of knee: Secondary | ICD-10-CM | POA: Diagnosis not present

## 2022-12-13 DIAGNOSIS — M5459 Other low back pain: Secondary | ICD-10-CM | POA: Diagnosis not present

## 2022-12-20 DIAGNOSIS — M5459 Other low back pain: Secondary | ICD-10-CM | POA: Diagnosis not present

## 2022-12-20 DIAGNOSIS — M170 Bilateral primary osteoarthritis of knee: Secondary | ICD-10-CM | POA: Diagnosis not present

## 2022-12-24 DIAGNOSIS — E039 Hypothyroidism, unspecified: Secondary | ICD-10-CM

## 2022-12-24 DIAGNOSIS — E538 Deficiency of other specified B group vitamins: Secondary | ICD-10-CM

## 2023-01-14 DIAGNOSIS — E538 Deficiency of other specified B group vitamins: Secondary | ICD-10-CM | POA: Insufficient documentation

## 2023-01-14 NOTE — Telephone Encounter (Signed)
Done

## 2023-01-16 DIAGNOSIS — M170 Bilateral primary osteoarthritis of knee: Secondary | ICD-10-CM | POA: Diagnosis not present

## 2023-01-16 DIAGNOSIS — M545 Low back pain, unspecified: Secondary | ICD-10-CM | POA: Diagnosis not present

## 2023-01-23 ENCOUNTER — Other Ambulatory Visit (INDEPENDENT_AMBULATORY_CARE_PROVIDER_SITE_OTHER): Payer: Medicare HMO

## 2023-01-23 ENCOUNTER — Telehealth: Payer: Self-pay | Admitting: Family Medicine

## 2023-01-23 DIAGNOSIS — E538 Deficiency of other specified B group vitamins: Secondary | ICD-10-CM

## 2023-01-23 DIAGNOSIS — E039 Hypothyroidism, unspecified: Secondary | ICD-10-CM | POA: Diagnosis not present

## 2023-01-23 LAB — T4, FREE: Free T4: 1.23 ng/dL (ref 0.60–1.60)

## 2023-01-23 LAB — VITAMIN B12: Vitamin B-12: 447 pg/mL (ref 211–911)

## 2023-01-23 LAB — TSH: TSH: 1.5 u[IU]/mL (ref 0.35–5.50)

## 2023-01-23 LAB — T3, FREE: T3, Free: 3.2 pg/mL (ref 2.3–4.2)

## 2023-01-23 NOTE — Telephone Encounter (Signed)
Patient would like a lab report mailed to her once her lab results come back.

## 2023-01-23 NOTE — Telephone Encounter (Signed)
Noted  

## 2023-01-25 ENCOUNTER — Other Ambulatory Visit: Payer: Self-pay

## 2023-01-25 DIAGNOSIS — E539 Vitamin B deficiency, unspecified: Secondary | ICD-10-CM

## 2023-01-25 MED ORDER — CYANOCOBALAMIN 1000 MCG/ML IJ SOLN: 1000.0000 ug | INTRAMUSCULAR | 0 refills | Status: DC

## 2023-01-25 NOTE — Telephone Encounter (Signed)
Pt copy of lab results have been mailed to her address. Pt is aware

## 2023-01-29 ENCOUNTER — Other Ambulatory Visit: Payer: Self-pay

## 2023-01-29 ENCOUNTER — Telehealth: Payer: Self-pay | Admitting: Family Medicine

## 2023-01-29 DIAGNOSIS — E539 Vitamin B deficiency, unspecified: Secondary | ICD-10-CM

## 2023-01-29 MED ORDER — CYANOCOBALAMIN 1000 MCG/ML IJ SOLN: 1000.0000 ug | INTRAMUSCULAR | 0 refills | Status: DC

## 2023-01-29 NOTE — Telephone Encounter (Signed)
Meghan Dixon with [facility] Stating there is a discrepancy regarding the B12  Please call her back at your earliest convenience to discuss.

## 2023-01-29 NOTE — Telephone Encounter (Signed)
Spoke with pt pharmacy, Rx changed to Once monthly as per Dr Clent Ridges

## 2023-01-30 DIAGNOSIS — M25562 Pain in left knee: Secondary | ICD-10-CM | POA: Diagnosis not present

## 2023-01-30 DIAGNOSIS — M1712 Unilateral primary osteoarthritis, left knee: Secondary | ICD-10-CM | POA: Diagnosis not present

## 2023-02-14 DIAGNOSIS — M170 Bilateral primary osteoarthritis of knee: Secondary | ICD-10-CM | POA: Diagnosis not present

## 2023-02-14 DIAGNOSIS — M1711 Unilateral primary osteoarthritis, right knee: Secondary | ICD-10-CM | POA: Diagnosis not present

## 2023-02-14 DIAGNOSIS — G8918 Other acute postprocedural pain: Secondary | ICD-10-CM | POA: Diagnosis not present

## 2023-02-14 DIAGNOSIS — M1712 Unilateral primary osteoarthritis, left knee: Secondary | ICD-10-CM | POA: Diagnosis not present

## 2023-02-18 DIAGNOSIS — M25562 Pain in left knee: Secondary | ICD-10-CM | POA: Diagnosis not present

## 2023-02-18 DIAGNOSIS — M25662 Stiffness of left knee, not elsewhere classified: Secondary | ICD-10-CM | POA: Diagnosis not present

## 2023-02-22 DIAGNOSIS — M25562 Pain in left knee: Secondary | ICD-10-CM | POA: Diagnosis not present

## 2023-02-22 DIAGNOSIS — M25662 Stiffness of left knee, not elsewhere classified: Secondary | ICD-10-CM | POA: Diagnosis not present

## 2023-02-25 IMAGING — MG MM DIGITAL DIAGNOSTIC UNILAT*R* W/ TOMO W/ CAD
8 series · 9 of 24 positions shown · non-contrast
Comparison: Previous exam(s).

CLINICAL DATA: Patient recalled from screening for right breast
asymmetry.

EXAM:
DIGITAL DIAGNOSTIC UNILATERAL RIGHT MAMMOGRAM WITH TOMOSYNTHESIS AND
CAD
TECHNIQUE: Right digital diagnostic mammography and breast tomosynthesis was
performed. The images were evaluated with computer-aided detection.

[R CC synth-2D (1 of 2)]
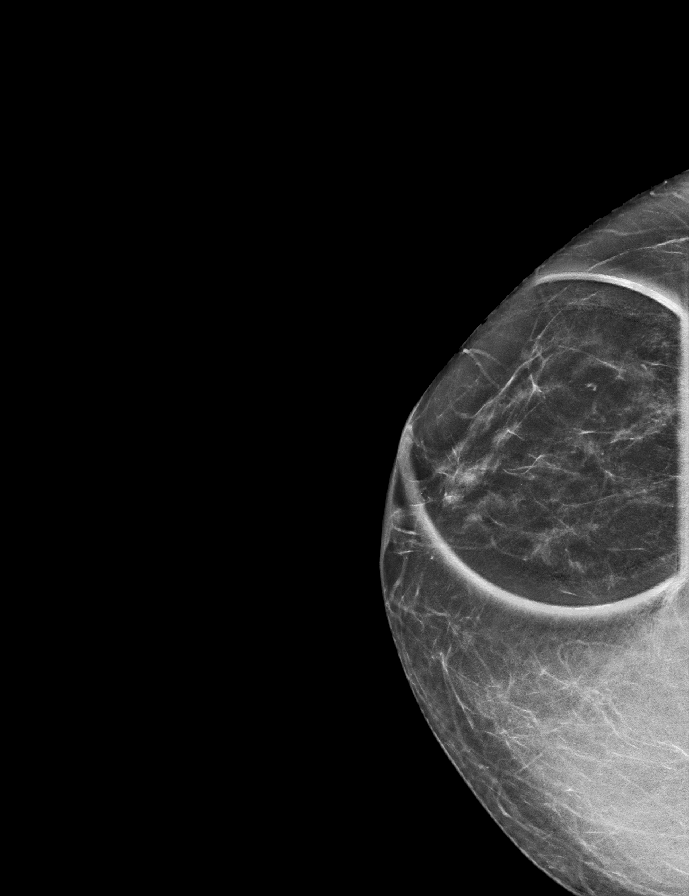

[R ML synth-2D (1 of 2)]
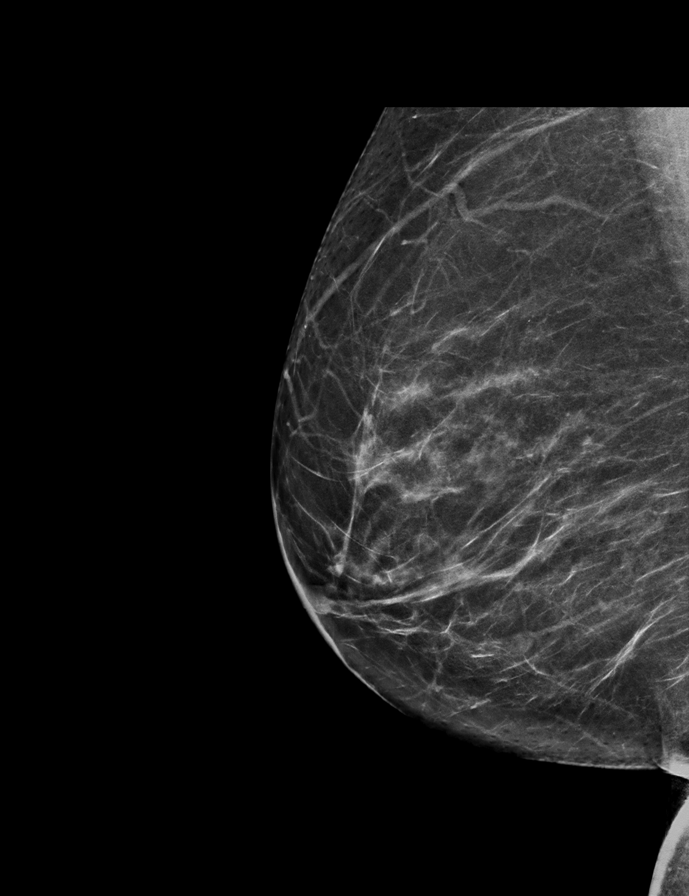

[R CC synth-2D (2 of 2)]
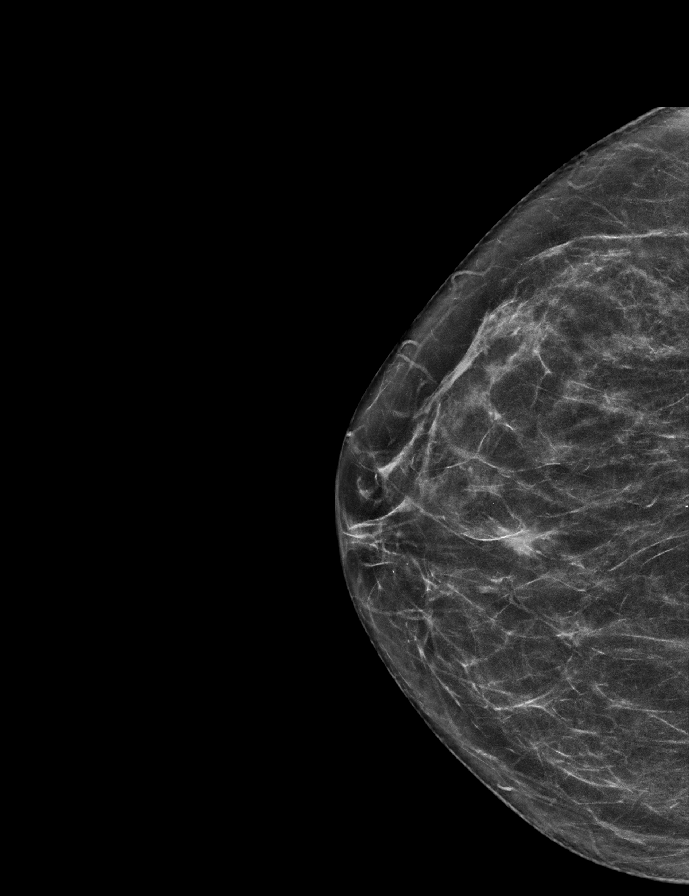

[R ML synth-2D (2 of 2)]
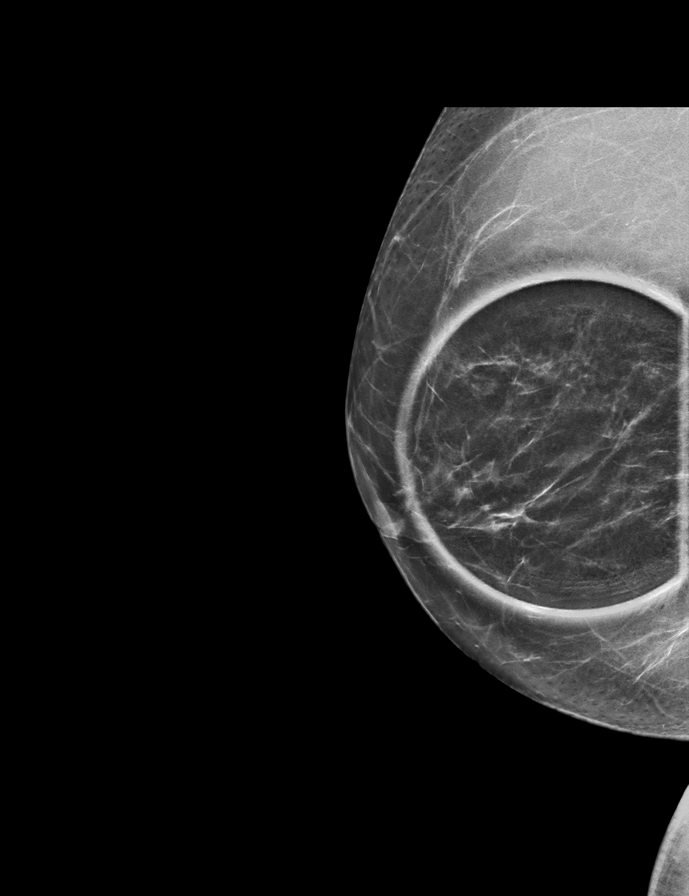

[R CC tomo · 2 of 53 frames shown (1 of 2)]
[frame 18/53]
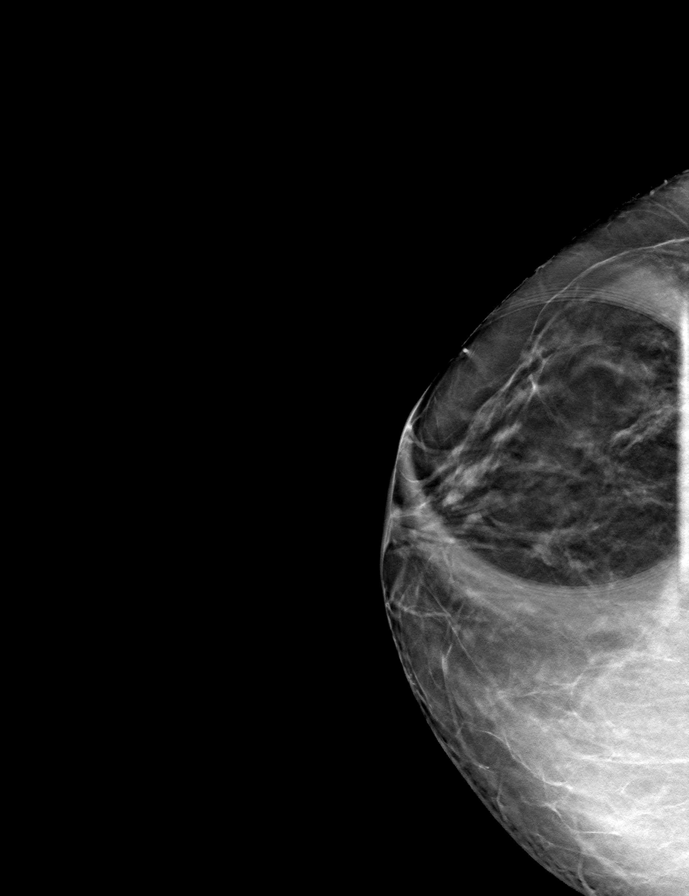
[frame 27/53]
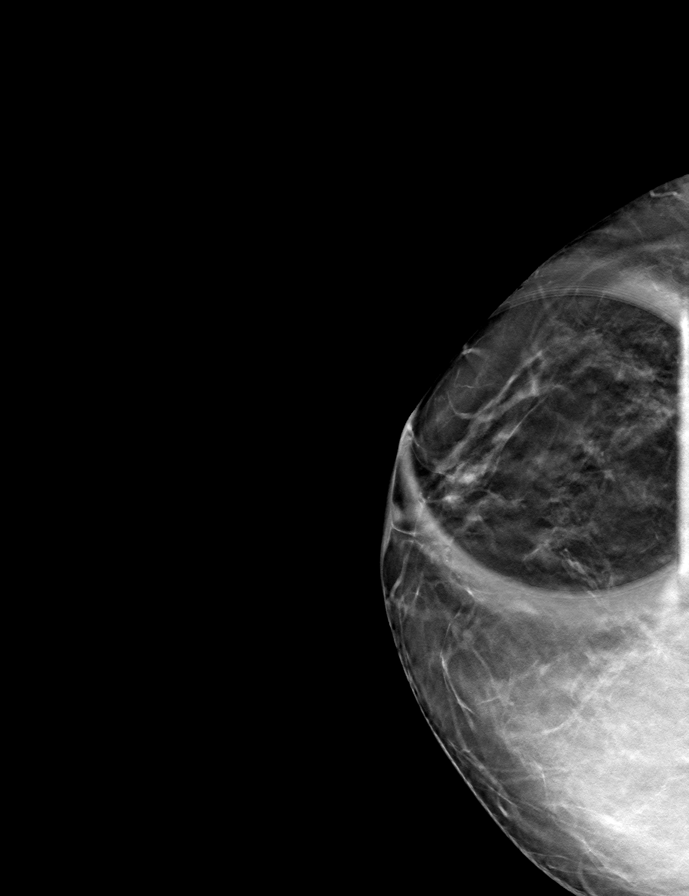

[R CC tomo (2 of 2) · tomo slice 31/62.0]
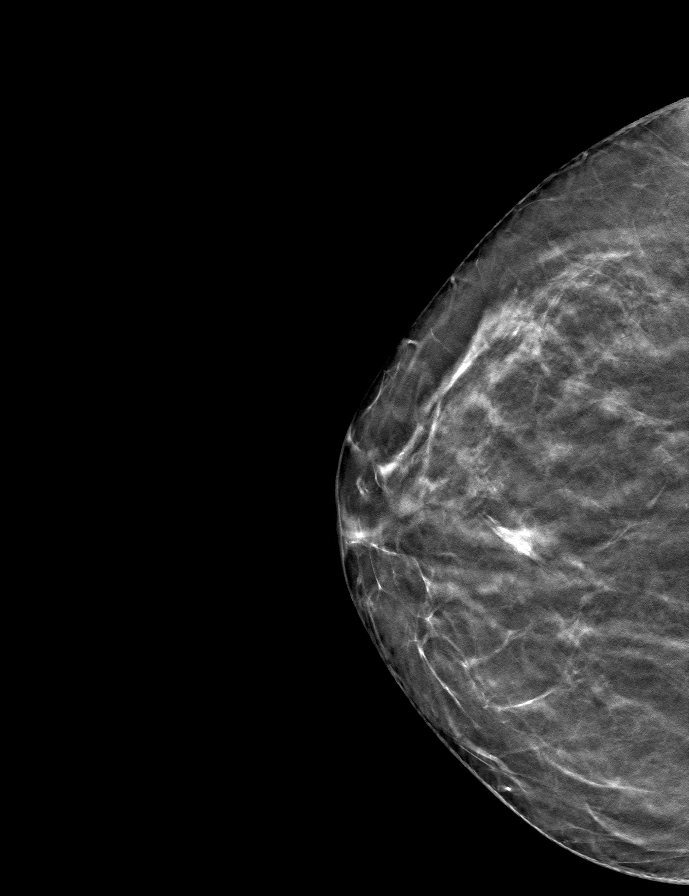

[R ML tomo (1 of 2) · tomo slice 29/58.0]
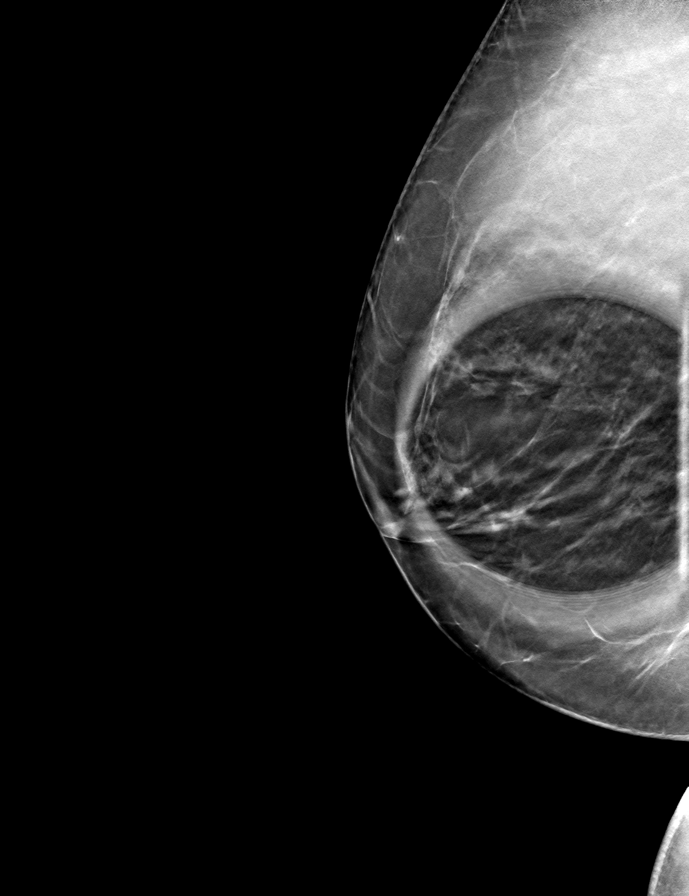

[R ML tomo (2 of 2) · tomo slice 35/70.0]
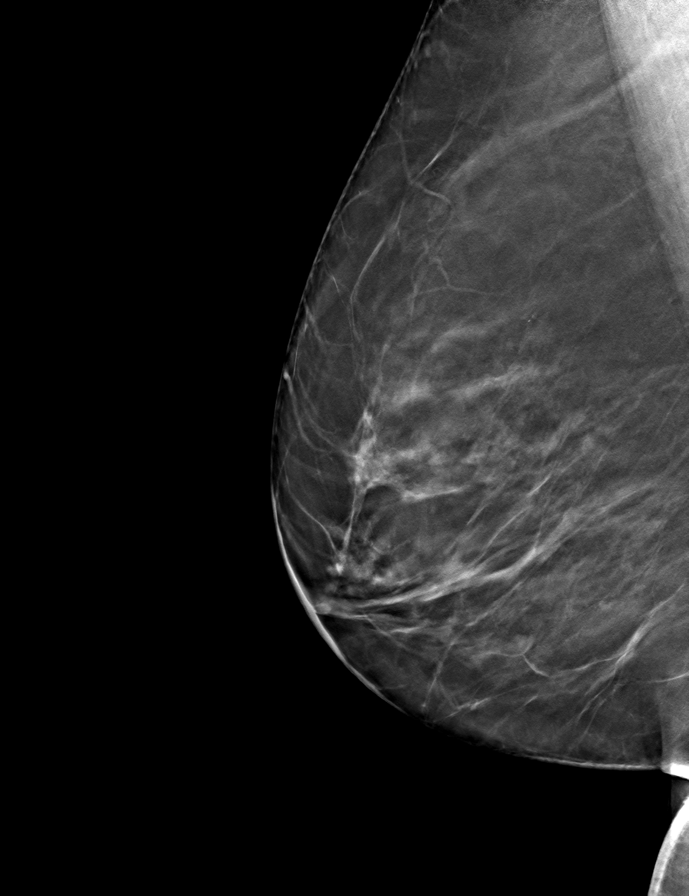

[9 of 24 positions shown; findings below may reference images not displayed]

ACR Breast Density Category c: The breast tissue is heterogeneously
dense, which may obscure small masses.
FINDINGS: Questioned asymmetry within the outer anterior right breast resolved
with additional imaging compatible with dense overlapping
fibroglandular tissue. No suspicious findings.
IMPRESSION: No mammographic evidence for malignancy.

RECOMMENDATION:
Screening mammogram in one year.(Code:MT-D-93E)

I have discussed the findings and recommendations with the patient.
If applicable, a reminder letter will be sent to the patient
regarding the next appointment.

BI-RADS CATEGORY  1: Negative.

## 2023-02-26 DIAGNOSIS — M25662 Stiffness of left knee, not elsewhere classified: Secondary | ICD-10-CM | POA: Diagnosis not present

## 2023-02-26 DIAGNOSIS — M25562 Pain in left knee: Secondary | ICD-10-CM | POA: Diagnosis not present

## 2023-02-27 DIAGNOSIS — M1712 Unilateral primary osteoarthritis, left knee: Secondary | ICD-10-CM | POA: Diagnosis not present

## 2023-02-28 DIAGNOSIS — M25662 Stiffness of left knee, not elsewhere classified: Secondary | ICD-10-CM | POA: Diagnosis not present

## 2023-02-28 DIAGNOSIS — M25562 Pain in left knee: Secondary | ICD-10-CM | POA: Diagnosis not present

## 2023-03-04 DIAGNOSIS — M25562 Pain in left knee: Secondary | ICD-10-CM | POA: Diagnosis not present

## 2023-03-04 DIAGNOSIS — R600 Localized edema: Secondary | ICD-10-CM | POA: Diagnosis not present

## 2023-03-04 DIAGNOSIS — M25662 Stiffness of left knee, not elsewhere classified: Secondary | ICD-10-CM | POA: Diagnosis not present

## 2023-03-07 DIAGNOSIS — M25662 Stiffness of left knee, not elsewhere classified: Secondary | ICD-10-CM | POA: Diagnosis not present

## 2023-03-07 DIAGNOSIS — M25562 Pain in left knee: Secondary | ICD-10-CM | POA: Diagnosis not present

## 2023-03-12 DIAGNOSIS — M25662 Stiffness of left knee, not elsewhere classified: Secondary | ICD-10-CM | POA: Diagnosis not present

## 2023-03-12 DIAGNOSIS — M25562 Pain in left knee: Secondary | ICD-10-CM | POA: Diagnosis not present

## 2023-03-18 DIAGNOSIS — M25562 Pain in left knee: Secondary | ICD-10-CM | POA: Diagnosis not present

## 2023-03-18 DIAGNOSIS — M25662 Stiffness of left knee, not elsewhere classified: Secondary | ICD-10-CM | POA: Diagnosis not present

## 2023-03-20 DIAGNOSIS — M25662 Stiffness of left knee, not elsewhere classified: Secondary | ICD-10-CM | POA: Diagnosis not present

## 2023-03-20 DIAGNOSIS — M25562 Pain in left knee: Secondary | ICD-10-CM | POA: Diagnosis not present

## 2023-03-25 DIAGNOSIS — M25562 Pain in left knee: Secondary | ICD-10-CM | POA: Diagnosis not present

## 2023-03-25 DIAGNOSIS — M25662 Stiffness of left knee, not elsewhere classified: Secondary | ICD-10-CM | POA: Diagnosis not present

## 2023-03-28 DIAGNOSIS — D225 Melanocytic nevi of trunk: Secondary | ICD-10-CM | POA: Diagnosis not present

## 2023-03-28 DIAGNOSIS — R208 Other disturbances of skin sensation: Secondary | ICD-10-CM | POA: Diagnosis not present

## 2023-03-28 DIAGNOSIS — Z1283 Encounter for screening for malignant neoplasm of skin: Secondary | ICD-10-CM | POA: Diagnosis not present

## 2023-03-28 DIAGNOSIS — L258 Unspecified contact dermatitis due to other agents: Secondary | ICD-10-CM | POA: Diagnosis not present

## 2023-03-29 DIAGNOSIS — M1712 Unilateral primary osteoarthritis, left knee: Secondary | ICD-10-CM | POA: Diagnosis not present

## 2023-04-02 DIAGNOSIS — R87622 Low grade squamous intraepithelial lesion on cytologic smear of vagina (LGSIL): Secondary | ICD-10-CM | POA: Diagnosis not present

## 2023-04-02 DIAGNOSIS — Z6831 Body mass index (BMI) 31.0-31.9, adult: Secondary | ICD-10-CM | POA: Diagnosis not present

## 2023-04-02 DIAGNOSIS — Z1272 Encounter for screening for malignant neoplasm of vagina: Secondary | ICD-10-CM | POA: Diagnosis not present

## 2023-04-02 DIAGNOSIS — N890 Mild vaginal dysplasia: Secondary | ICD-10-CM | POA: Diagnosis not present

## 2023-04-02 DIAGNOSIS — Z124 Encounter for screening for malignant neoplasm of cervix: Secondary | ICD-10-CM | POA: Diagnosis not present

## 2023-04-03 DIAGNOSIS — H43813 Vitreous degeneration, bilateral: Secondary | ICD-10-CM | POA: Diagnosis not present

## 2023-04-03 DIAGNOSIS — H5203 Hypermetropia, bilateral: Secondary | ICD-10-CM | POA: Diagnosis not present

## 2023-04-03 DIAGNOSIS — H524 Presbyopia: Secondary | ICD-10-CM | POA: Diagnosis not present

## 2023-04-03 DIAGNOSIS — Z961 Presence of intraocular lens: Secondary | ICD-10-CM | POA: Diagnosis not present

## 2023-04-03 DIAGNOSIS — H52223 Regular astigmatism, bilateral: Secondary | ICD-10-CM | POA: Diagnosis not present

## 2023-04-03 DIAGNOSIS — H04123 Dry eye syndrome of bilateral lacrimal glands: Secondary | ICD-10-CM | POA: Diagnosis not present

## 2023-04-24 ENCOUNTER — Ambulatory Visit: Payer: Medicare HMO | Admitting: Family Medicine

## 2023-05-14 DIAGNOSIS — E538 Deficiency of other specified B group vitamins: Secondary | ICD-10-CM

## 2023-05-14 DIAGNOSIS — Z01818 Encounter for other preprocedural examination: Secondary | ICD-10-CM

## 2023-05-27 ENCOUNTER — Ambulatory Visit (INDEPENDENT_AMBULATORY_CARE_PROVIDER_SITE_OTHER): Payer: PPO | Admitting: Family Medicine

## 2023-05-27 ENCOUNTER — Encounter: Payer: Self-pay | Admitting: Family Medicine

## 2023-05-27 VITALS — BP 174/84 | HR 94 | Wt 178.2 lb

## 2023-05-27 DIAGNOSIS — H6692 Otitis media, unspecified, left ear: Secondary | ICD-10-CM

## 2023-05-27 DIAGNOSIS — J0100 Acute maxillary sinusitis, unspecified: Secondary | ICD-10-CM

## 2023-05-27 MED ORDER — AZITHROMYCIN 250 MG PO TABS: ORAL_TABLET | ORAL | 0 refills | Status: AC

## 2023-05-27 MED ORDER — FLUTICASONE PROPIONATE 50 MCG/ACT NA SUSP: 2.0000 | Freq: Every day | NASAL | 1 refills | Status: AC

## 2023-05-27 NOTE — Patient Instructions (Addendum)
-  Prescribed Azithromycin  250mg . Take 2 tablets today and 1 tablet, 2nd through 5th day.  -Prescribed Flonase  2 sprays in each nostril for 3 days.  -Follow up in not improved after taking full course of medication.

## 2023-05-27 NOTE — Progress Notes (Addendum)
 Acute Office Visit   Subjective:  Patient ID: Meghan Dixon, female    DOB: Feb 27, 1946, 77 y.o.   MRN: [id number]  Chief Complaint  Patient presents with   Cough    Cough and sinus drip with ear ache on mainly the left ear, started 2 weeks ago    HPI Patient is complaining of a left ear ache with no drainage, bilateral "crackling" feeling in both ears, left sided sore throat, non-productive cough, frontal headache.   Denies chest pain, shortness of breath, nausea, vomiting, fever.   Symptoms started two weeks ago. She reports she took Claritin with a little bit of relief last week. Also, takes Tylenol and Alka Seltzer Cold with no relief.   Review of Systems  Respiratory:  Positive for cough.    See HPI above      Objective:   BP (!) 174/84   Pulse 94   Wt 178 lb 3.2 oz (80.8 kg)   SpO2 98%   BMI 30.12 kg/m    Physical Exam Vitals reviewed.  Constitutional:      General: She is not in acute distress.    Appearance: Normal appearance. She is not ill-appearing, toxic-appearing or diaphoretic.  HENT:     Head: Normocephalic and atraumatic.     Right Ear: Tympanic membrane, ear canal and external ear normal. There is no impacted cerumen.     Left Ear: Ear canal and external ear normal.     Ears:     Comments: Fluid behind left TM. No redness of TM.     Nose:     Right Sinus: Maxillary sinus tenderness present. No frontal sinus tenderness.     Left Sinus: Maxillary sinus tenderness (More on the left side) present. No frontal sinus tenderness.     Mouth/Throat:     Pharynx: Oropharynx is clear. Uvula midline. No pharyngeal swelling, oropharyngeal exudate, posterior oropharyngeal erythema or uvula swelling.  Eyes:     General:        Right eye: No discharge.        Left eye: No discharge.     Conjunctiva/sclera: Conjunctivae normal.  Cardiovascular:     Rate and Rhythm: Normal rate and regular rhythm.     Heart sounds: Normal heart sounds. No murmur heard.    No  friction rub. No gallop.  Pulmonary:     Effort: Pulmonary effort is normal. No respiratory distress.     Breath sounds: Normal breath sounds.     Comments: Dry cough during visit  Musculoskeletal:        General: Normal range of motion.  Skin:    General: Skin is warm and dry.  Neurological:     General: No focal deficit present.     Mental Status: She is alert and oriented to person, place, and time. Mental status is at baseline.  Psychiatric:        Mood and Affect: Mood normal.        Behavior: Behavior normal.        Thought Content: Thought content normal.        Judgment: Judgment normal.       Assessment & Plan:  Acute non-recurrent maxillary sinusitis -     Azithromycin ; Take 2 tablets on day 1, then 1 tablet daily on days 2 through 5  Dispense: 6 tablet; Refill: 0 -     Fluticasone  Propionate; Place 2 sprays into both nostrils daily.  Dispense: 16 g; Refill: 1  Acute  infection of left ear -     Azithromycin ; Take 2 tablets on day 1, then 1 tablet daily on days 2 through 5  Dispense: 6 tablet; Refill: 0  -Prescribed Azithromycin  250mg . Take 2 tablets today and 1 tablet, 2nd through 5th day.  -Prescribed Flonase  2 sprays in each nostril for 3 days. (Patient prefers only 3 days, reports longer than this causes throat irration).  -Follow up if not improved after taking full course of medication.   No follow-ups on file.  Shanekia Latella, NP

## 2023-05-31 ENCOUNTER — Telehealth: Payer: Self-pay

## 2023-05-31 DIAGNOSIS — M1711 Unilateral primary osteoarthritis, right knee: Secondary | ICD-10-CM | POA: Diagnosis not present

## 2023-05-31 NOTE — Telephone Encounter (Signed)
Copied from CRM [id number]. Topic: Clinical - Request for Lab/Test Order >> May 31, 2023  2:33 PM Truddie Crumble wrote: Reason for CRM: patient called stating she need a form filled out that was faxed to them by Delbert Harness and she would like to see if doctor Clent Ridges will approve blood work that [employer] need for her knee replacement

## 2023-06-03 NOTE — Telephone Encounter (Signed)
FYI Pt form printed and placed on Dr Clent Ridges red folder

## 2023-06-04 ENCOUNTER — Other Ambulatory Visit (INDEPENDENT_AMBULATORY_CARE_PROVIDER_SITE_OTHER): Payer: PPO

## 2023-06-04 DIAGNOSIS — Z01818 Encounter for other preprocedural examination: Secondary | ICD-10-CM | POA: Diagnosis not present

## 2023-06-04 DIAGNOSIS — E538 Deficiency of other specified B group vitamins: Secondary | ICD-10-CM

## 2023-06-04 LAB — CBC WITH DIFFERENTIAL/PLATELET
Basophils Absolute: 0 10*3/uL (ref 0.0–0.1)
Basophils Relative: 0.7 % (ref 0.0–3.0)
Eosinophils Absolute: 0.1 10*3/uL (ref 0.0–0.7)
Eosinophils Relative: 2 % (ref 0.0–5.0)
HCT: 41.4 % (ref 36.0–46.0)
Hemoglobin: 13.6 g/dL (ref 12.0–15.0)
Lymphocytes Relative: 21.6 % (ref 12.0–46.0)
Lymphs Abs: 1.2 10*3/uL (ref 0.7–4.0)
MCHC: 33 g/dL (ref 30.0–36.0)
MCV: 92.6 fL (ref 78.0–100.0)
Monocytes Absolute: 0.6 10*3/uL (ref 0.1–1.0)
Monocytes Relative: 9.8 % (ref 3.0–12.0)
Neutro Abs: 3.7 10*3/uL (ref 1.4–7.7)
Neutrophils Relative %: 65.9 % (ref 43.0–77.0)
Platelets: 288 10*3/uL (ref 150.0–400.0)
RBC: 4.47 Mil/uL (ref 3.87–5.11)
RDW: 13.1 % (ref 11.5–15.5)
WBC: 5.7 10*3/uL (ref 4.0–10.5)

## 2023-06-04 LAB — BASIC METABOLIC PANEL
BUN: 9 mg/dL (ref 6–23)
CO2: 30 meq/L (ref 19–32)
Calcium: 9.3 mg/dL (ref 8.4–10.5)
Chloride: 103 meq/L (ref 96–112)
Creatinine, Ser: 0.6 mg/dL (ref 0.40–1.20)
GFR: 87.77 mL/min (ref >60.00)
Glucose, Bld: 96 mg/dL (ref 70–99)
Potassium: 3.6 meq/L (ref 3.5–5.1)
Sodium: 142 meq/L (ref 135–145)

## 2023-06-04 LAB — VITAMIN B12: Vitamin B-12: 500 pg/mL (ref 211–911)

## 2023-06-04 LAB — HEMOGLOBIN A1C: Hgb A1c MFr Bld: 5.7 % (ref 4.6–6.5)

## 2023-06-04 NOTE — Telephone Encounter (Signed)
Here for preop clearance for a knee surgery

## 2023-06-04 NOTE — Telephone Encounter (Signed)
 Done

## 2023-06-10 ENCOUNTER — Other Ambulatory Visit: Payer: Self-pay | Admitting: Family Medicine

## 2023-06-10 DIAGNOSIS — Z1231 Encounter for screening mammogram for malignant neoplasm of breast: Secondary | ICD-10-CM

## 2023-06-12 ENCOUNTER — Encounter: Payer: Self-pay | Admitting: *Deleted

## 2023-06-13 ENCOUNTER — Ambulatory Visit: Payer: PPO

## 2023-06-19 DIAGNOSIS — M1711 Unilateral primary osteoarthritis, right knee: Secondary | ICD-10-CM | POA: Diagnosis not present

## 2023-06-28 ENCOUNTER — Ambulatory Visit (INDEPENDENT_AMBULATORY_CARE_PROVIDER_SITE_OTHER): Admitting: Family Medicine

## 2023-06-28 ENCOUNTER — Encounter: Payer: Self-pay | Admitting: Family Medicine

## 2023-06-28 VITALS — BP 132/80 | HR 72 | Temp 98.3°F | Wt 180.0 lb

## 2023-06-28 DIAGNOSIS — J0191 Acute recurrent sinusitis, unspecified: Secondary | ICD-10-CM | POA: Diagnosis not present

## 2023-06-28 MED ORDER — METHYLPREDNISOLONE ACETATE 40 MG/ML IJ SUSP
40.0000 mg | Freq: Once | INTRAMUSCULAR | Status: AC
  Administered 2023-06-28: 40 mg via INTRAMUSCULAR

## 2023-06-28 MED ORDER — METHYLPREDNISOLONE ACETATE 80 MG/ML IJ SUSP
80.0000 mg | Freq: Once | INTRAMUSCULAR | Status: AC
Start: 2023-06-28 — End: 2023-06-28
  Administered 2023-06-28: 80 mg via INTRAMUSCULAR

## 2023-06-28 MED ORDER — DOXYCYCLINE HYCLATE 100 MG PO TABS: 100.0000 mg | ORAL_TABLET | Freq: Two times a day (BID) | ORAL | 0 refills | Status: DC

## 2023-06-28 NOTE — Addendum Note (Signed)
 Addended by: Carola Rhine on: 06/28/2023 11:31 AM   Modules accepted: Orders

## 2023-06-28 NOTE — Progress Notes (Signed)
   Subjective:    Patient ID: Meghan Dixon, female    DOB: [birth year], 78 y.o.   MRN: [id number]  HPI Here for 6 weeks of left ear pain, PND, sinus congestion and a ST. No fever or cough. She was seen here on 05-27-23 and was treated with a Zpack . This seemed to help for a few weeks, but now the same symptoms have returned. Taking Claritin.    Review of Systems  Constitutional: Negative.   HENT:  Positive for congestion, ear pain, postnasal drip, sinus pressure and sore throat.   Eyes: Negative.   Respiratory: Negative.         Objective:   Physical Exam Constitutional:      Appearance: Normal appearance.  HENT:     Right Ear: Tympanic membrane, ear canal and external ear normal.     Left Ear: Tympanic membrane, ear canal and external ear normal.     Nose: Nose normal.     Mouth/Throat:     Pharynx: Oropharynx is clear.  Eyes:     Conjunctiva/sclera: Conjunctivae normal.  Pulmonary:     Effort: Pulmonary effort is normal.     Breath sounds: Normal breath sounds.  Lymphadenopathy:     Cervical: No cervical adenopathy.  Neurological:     Mental Status: She is alert.           Assessment & Plan:  Partially treated sinusitis. We will give her 10 days of Doxycycline and a shot of DepoMedrol.  Gershon Crane, MD

## 2023-07-11 DIAGNOSIS — M25761 Osteophyte, right knee: Secondary | ICD-10-CM | POA: Diagnosis not present

## 2023-07-11 DIAGNOSIS — M1711 Unilateral primary osteoarthritis, right knee: Secondary | ICD-10-CM | POA: Diagnosis not present

## 2023-07-11 DIAGNOSIS — Z96651 Presence of right artificial knee joint: Secondary | ICD-10-CM | POA: Diagnosis not present

## 2023-07-11 DIAGNOSIS — G8918 Other acute postprocedural pain: Secondary | ICD-10-CM | POA: Diagnosis not present

## 2023-07-15 DIAGNOSIS — M25561 Pain in right knee: Secondary | ICD-10-CM | POA: Diagnosis not present

## 2023-07-15 DIAGNOSIS — M25661 Stiffness of right knee, not elsewhere classified: Secondary | ICD-10-CM | POA: Diagnosis not present

## 2023-07-24 DIAGNOSIS — M1711 Unilateral primary osteoarthritis, right knee: Secondary | ICD-10-CM | POA: Diagnosis not present

## 2023-07-25 ENCOUNTER — Other Ambulatory Visit (HOSPITAL_COMMUNITY): Payer: Self-pay

## 2023-07-29 DIAGNOSIS — M25661 Stiffness of right knee, not elsewhere classified: Secondary | ICD-10-CM | POA: Diagnosis not present

## 2023-07-29 DIAGNOSIS — M25561 Pain in right knee: Secondary | ICD-10-CM | POA: Diagnosis not present

## 2023-08-02 DIAGNOSIS — M25661 Stiffness of right knee, not elsewhere classified: Secondary | ICD-10-CM | POA: Diagnosis not present

## 2023-08-02 DIAGNOSIS — M25561 Pain in right knee: Secondary | ICD-10-CM | POA: Diagnosis not present

## 2023-08-05 DIAGNOSIS — M25661 Stiffness of right knee, not elsewhere classified: Secondary | ICD-10-CM | POA: Diagnosis not present

## 2023-08-05 DIAGNOSIS — M25561 Pain in right knee: Secondary | ICD-10-CM | POA: Diagnosis not present

## 2023-08-08 DIAGNOSIS — M25661 Stiffness of right knee, not elsewhere classified: Secondary | ICD-10-CM | POA: Diagnosis not present

## 2023-08-08 DIAGNOSIS — M25561 Pain in right knee: Secondary | ICD-10-CM | POA: Diagnosis not present

## 2023-08-14 DIAGNOSIS — M25661 Stiffness of right knee, not elsewhere classified: Secondary | ICD-10-CM | POA: Diagnosis not present

## 2023-08-14 DIAGNOSIS — M25561 Pain in right knee: Secondary | ICD-10-CM | POA: Diagnosis not present

## 2023-08-16 DIAGNOSIS — M25661 Stiffness of right knee, not elsewhere classified: Secondary | ICD-10-CM | POA: Diagnosis not present

## 2023-08-16 DIAGNOSIS — M25561 Pain in right knee: Secondary | ICD-10-CM | POA: Diagnosis not present

## 2023-08-20 DIAGNOSIS — M25661 Stiffness of right knee, not elsewhere classified: Secondary | ICD-10-CM | POA: Diagnosis not present

## 2023-08-20 DIAGNOSIS — M25561 Pain in right knee: Secondary | ICD-10-CM | POA: Diagnosis not present

## 2023-08-23 DIAGNOSIS — M25661 Stiffness of right knee, not elsewhere classified: Secondary | ICD-10-CM | POA: Diagnosis not present

## 2023-08-23 DIAGNOSIS — M25561 Pain in right knee: Secondary | ICD-10-CM | POA: Diagnosis not present

## 2023-08-26 DIAGNOSIS — M25561 Pain in right knee: Secondary | ICD-10-CM | POA: Diagnosis not present

## 2023-08-26 DIAGNOSIS — M25661 Stiffness of right knee, not elsewhere classified: Secondary | ICD-10-CM | POA: Diagnosis not present

## 2023-08-27 ENCOUNTER — Telehealth: Payer: Self-pay | Admitting: Family Medicine

## 2023-08-27 DIAGNOSIS — E559 Vitamin D deficiency, unspecified: Secondary | ICD-10-CM

## 2023-08-27 DIAGNOSIS — R739 Hyperglycemia, unspecified: Secondary | ICD-10-CM

## 2023-08-27 DIAGNOSIS — E538 Deficiency of other specified B group vitamins: Secondary | ICD-10-CM

## 2023-08-27 DIAGNOSIS — E7800 Pure hypercholesterolemia, unspecified: Secondary | ICD-10-CM

## 2023-08-27 DIAGNOSIS — E039 Hypothyroidism, unspecified: Secondary | ICD-10-CM

## 2023-08-27 DIAGNOSIS — I10 Essential (primary) hypertension: Secondary | ICD-10-CM

## 2023-08-27 NOTE — Telephone Encounter (Signed)
 Spoke with pt advised that Dr Alyne Babinski is out of the office until 09/02/23. Pt is requesting for CPE labs to be placed a week before her CPE app on 11/08/23. Pt is aware that I will call her back to schedule a lab app soon as orders are placed

## 2023-08-27 NOTE — Telephone Encounter (Signed)
 Copied from CRM [id number]. Topic: Clinical - Request for Lab/Test Order >> Aug 27, 2023  8:43 AM Juluis Ok wrote: Reason for CRM: Patient requesting to have labs completed before her 07/25 appointment. Please contact patient for scheduling.

## 2023-08-29 DIAGNOSIS — M25561 Pain in right knee: Secondary | ICD-10-CM | POA: Diagnosis not present

## 2023-08-29 DIAGNOSIS — M25661 Stiffness of right knee, not elsewhere classified: Secondary | ICD-10-CM | POA: Diagnosis not present

## 2023-09-02 NOTE — Telephone Encounter (Signed)
I placed the lab orders

## 2023-09-02 NOTE — Addendum Note (Signed)
 Addended by: Corita Diego A on: 09/02/2023 12:44 PM   Modules accepted: Orders

## 2023-09-03 ENCOUNTER — Other Ambulatory Visit (HOSPITAL_COMMUNITY): Payer: Self-pay

## 2023-09-03 ENCOUNTER — Other Ambulatory Visit: Payer: Self-pay

## 2023-09-03 MED ORDER — LEVOTHYROXINE SODIUM 125 MCG PO TABS
125.0000 ug | ORAL_TABLET | Freq: Every day | ORAL | 0 refills | Status: DC
  Filled 2023-09-03 – 2023-09-30 (×2): qty 90, 90d supply, fill #0

## 2023-09-03 NOTE — Telephone Encounter (Signed)
 Spoke with pt aware that Dr Alyne Babinski placed CPE labs for her, pt verbalized understanding

## 2023-09-04 DIAGNOSIS — M25561 Pain in right knee: Secondary | ICD-10-CM | POA: Diagnosis not present

## 2023-09-04 DIAGNOSIS — M25661 Stiffness of right knee, not elsewhere classified: Secondary | ICD-10-CM | POA: Diagnosis not present

## 2023-09-17 DIAGNOSIS — M25561 Pain in right knee: Secondary | ICD-10-CM | POA: Diagnosis not present

## 2023-09-17 DIAGNOSIS — M25661 Stiffness of right knee, not elsewhere classified: Secondary | ICD-10-CM | POA: Diagnosis not present

## 2023-09-24 DIAGNOSIS — M25561 Pain in right knee: Secondary | ICD-10-CM | POA: Diagnosis not present

## 2023-09-24 DIAGNOSIS — M25661 Stiffness of right knee, not elsewhere classified: Secondary | ICD-10-CM | POA: Diagnosis not present

## 2023-09-30 ENCOUNTER — Other Ambulatory Visit (HOSPITAL_COMMUNITY): Payer: Self-pay

## 2023-09-30 ENCOUNTER — Ambulatory Visit (INDEPENDENT_AMBULATORY_CARE_PROVIDER_SITE_OTHER): Admitting: Family Medicine

## 2023-09-30 ENCOUNTER — Encounter: Payer: Self-pay | Admitting: Family Medicine

## 2023-09-30 ENCOUNTER — Other Ambulatory Visit: Payer: Self-pay

## 2023-09-30 VITALS — BP 110/60 | HR 94 | Temp 98.2°F | Wt 177.0 lb

## 2023-09-30 DIAGNOSIS — J019 Acute sinusitis, unspecified: Secondary | ICD-10-CM | POA: Diagnosis not present

## 2023-09-30 MED ORDER — AZITHROMYCIN 250 MG PO TABS: ORAL_TABLET | ORAL | 0 refills | Status: DC

## 2023-09-30 NOTE — Progress Notes (Signed)
   Subjective:    Patient ID: Meghan Dixon, female    DOB: January 30, 1946, 78 y.o.   MRN: [id number]  HPI Here for 2 weeks of sinus pressure, right ear pain, PND, and dry cough. No fever. Using Claritin.    Review of Systems  Constitutional: Negative.  Negative for activity change, appetite change, diaphoresis, fatigue, fever and unexpected weight change.  HENT:  Positive for congestion, ear pain, postnasal drip and sinus pressure. Negative for hearing loss, nosebleeds, sore throat, tinnitus, trouble swallowing and voice change.   Eyes: Negative.  Negative for photophobia, pain, discharge, redness and visual disturbance.  Respiratory:  Positive for cough. Negative for apnea, choking, chest tightness, shortness of breath, wheezing and stridor.   Cardiovascular: Negative.  Negative for chest pain, palpitations and leg swelling.  Gastrointestinal: Negative.  Negative for abdominal distention, abdominal pain, blood in stool, constipation, diarrhea, nausea, rectal pain and vomiting.  Genitourinary: Negative.  Negative for difficulty urinating, dysuria, enuresis, flank pain, frequency, hematuria, menstrual problem, urgency, vaginal bleeding, vaginal discharge and vaginal pain.  Musculoskeletal: Negative.  Negative for arthralgias, back pain, gait problem, joint swelling, myalgias, neck pain and neck stiffness.  Skin: Negative.  Negative for color change, pallor, rash and wound.  Neurological: Negative.  Negative for dizziness, tremors, seizures, syncope, speech difficulty, weakness, light-headedness, numbness and headaches.  Hematological:  Negative for adenopathy. Does not bruise/bleed easily.  Psychiatric/Behavioral: Negative.  Negative for agitation, behavioral problems, confusion, dysphoric mood, hallucinations and sleep disturbance. The patient is not nervous/anxious.        Objective:   Physical Exam Constitutional:      Appearance: Normal appearance.  HENT:     Right Ear: Tympanic  membrane, ear canal and external ear normal.     Left Ear: Tympanic membrane, ear canal and external ear normal.     Nose: Nose normal.     Mouth/Throat:     Pharynx: Oropharynx is clear.   Eyes:     Conjunctiva/sclera: Conjunctivae normal.   Pulmonary:     Effort: Pulmonary effort is normal.     Breath sounds: Normal breath sounds.  Lymphadenopathy:     Cervical: No cervical adenopathy.   Neurological:     Mental Status: She is alert.           Assessment & Plan:  Sinusitis, treat with a Zpack. Add Mucinex  as needed.  Corita Diego, MD

## 2023-10-08 ENCOUNTER — Other Ambulatory Visit: Payer: Self-pay

## 2023-10-08 ENCOUNTER — Ambulatory Visit
Admission: RE | Admit: 2023-10-08 | Discharge: 2023-10-08 | Disposition: A | Discharge status: Home / Self Care | Source: Ambulatory Visit | Attending: Family Medicine | Admitting: Family Medicine

## 2023-10-08 ENCOUNTER — Other Ambulatory Visit (HOSPITAL_COMMUNITY): Payer: Self-pay

## 2023-10-08 DIAGNOSIS — Z1231 Encounter for screening mammogram for malignant neoplasm of breast: Secondary | ICD-10-CM | POA: Diagnosis not present

## 2023-10-11 ENCOUNTER — Other Ambulatory Visit: Payer: Self-pay | Admitting: Family Medicine

## 2023-10-11 DIAGNOSIS — R928 Other abnormal and inconclusive findings on diagnostic imaging of breast: Secondary | ICD-10-CM

## 2023-10-24 ENCOUNTER — Ambulatory Visit
Admission: RE | Admit: 2023-10-24 | Discharge: 2023-10-24 | Disposition: A | Discharge status: Home / Self Care | Source: Ambulatory Visit | Attending: Family Medicine | Admitting: Family Medicine

## 2023-10-24 ENCOUNTER — Other Ambulatory Visit

## 2023-10-24 DIAGNOSIS — R928 Other abnormal and inconclusive findings on diagnostic imaging of breast: Secondary | ICD-10-CM

## 2023-10-24 DIAGNOSIS — I10 Essential (primary) hypertension: Secondary | ICD-10-CM | POA: Diagnosis not present

## 2023-10-24 DIAGNOSIS — R739 Hyperglycemia, unspecified: Secondary | ICD-10-CM

## 2023-10-24 DIAGNOSIS — E559 Vitamin D deficiency, unspecified: Secondary | ICD-10-CM

## 2023-10-24 DIAGNOSIS — E039 Hypothyroidism, unspecified: Secondary | ICD-10-CM

## 2023-10-24 DIAGNOSIS — E538 Deficiency of other specified B group vitamins: Secondary | ICD-10-CM

## 2023-10-24 LAB — CBC WITH DIFFERENTIAL/PLATELET
Basophils Absolute: 0 K/uL (ref 0.0–0.1)
Basophils Relative: 0.7 % (ref 0.0–3.0)
Eosinophils Absolute: 0.1 K/uL (ref 0.0–0.7)
Eosinophils Relative: 2.5 % (ref 0.0–5.0)
HCT: 40.5 % (ref 36.0–46.0)
Hemoglobin: 13.1 g/dL (ref 12.0–15.0)
Lymphocytes Relative: 19.2 % (ref 12.0–46.0)
Lymphs Abs: 1 K/uL (ref 0.7–4.0)
MCHC: 32.4 g/dL (ref 30.0–36.0)
MCV: 90 fl (ref 78.0–100.0)
Monocytes Absolute: 0.5 K/uL (ref 0.1–1.0)
Monocytes Relative: 10.8 % (ref 3.0–12.0)
Neutro Abs: 3.3 K/uL (ref 1.4–7.7)
Neutrophils Relative %: 66.8 % (ref 43.0–77.0)
Platelets: 261 K/uL (ref 150.0–400.0)
RBC: 4.51 Mil/uL (ref 3.87–5.11)
RDW: 14.2 % (ref 11.5–15.5)
WBC: 5 K/uL (ref 4.0–10.5)

## 2023-10-24 LAB — BASIC METABOLIC PANEL WITH GFR
BUN: 11 mg/dL (ref 6–23)
CO2: 29 meq/L (ref 19–32)
Calcium: 9.3 mg/dL (ref 8.4–10.5)
Chloride: 105 meq/L (ref 96–112)
Creatinine, Ser: 0.63 mg/dL (ref 0.40–1.20)
GFR: 86.51 mL/min (ref >60.00)
Glucose, Bld: 96 mg/dL (ref 70–99)
Potassium: 4 meq/L (ref 3.5–5.1)
Sodium: 140 meq/L (ref 135–145)

## 2023-10-24 LAB — T4, FREE: Free T4: 1.31 ng/dL (ref 0.60–1.60)

## 2023-10-24 LAB — HEPATIC FUNCTION PANEL
ALT: 17 U/L (ref 0–35)
AST: 21 U/L (ref 0–37)
Albumin: 4.4 g/dL (ref 3.5–5.2)
Alkaline Phosphatase: 68 U/L (ref 39–117)
Bilirubin, Direct: 0 mg/dL (ref 0.0–0.3)
Total Bilirubin: 0.5 mg/dL (ref 0.2–1.2)
Total Protein: 7.5 g/dL (ref 6.0–8.3)

## 2023-10-24 LAB — T3, FREE: T3, Free: 3.3 pg/mL (ref 2.3–4.2)

## 2023-10-24 LAB — TSH: TSH: 1.02 u[IU]/mL (ref 0.35–5.50)

## 2023-10-24 LAB — LIPID PANEL
Cholesterol: 190 mg/dL (ref 0–200)
HDL: 60.3 mg/dL (ref >39.00)
LDL Cholesterol: 111 mg/dL — ABNORMAL HIGH (ref 0–99)
NonHDL: 129.41
Total CHOL/HDL Ratio: 3
Triglycerides: 91 mg/dL (ref 0.0–149.0)
VLDL: 18.2 mg/dL (ref 0.0–40.0)

## 2023-10-24 LAB — HEMOGLOBIN A1C: Hgb A1c MFr Bld: 6 % (ref 4.6–6.5)

## 2023-10-24 LAB — VITAMIN B12: Vitamin B-12: 414 pg/mL (ref 211–911)

## 2023-10-24 LAB — VITAMIN D 25 HYDROXY (VIT D DEFICIENCY, FRACTURES): VITD: 36.54 ng/mL (ref 30.00–100.00)

## 2023-10-25 ENCOUNTER — Ambulatory Visit: Payer: Self-pay | Admitting: Family Medicine

## 2023-10-29 ENCOUNTER — Ambulatory Visit: Payer: Self-pay

## 2023-10-29 ENCOUNTER — Telehealth: Payer: Self-pay | Admitting: Family Medicine

## 2023-10-29 NOTE — Telephone Encounter (Addendum)
 Multiple attempts made to contact pt, unsuccessful. [facility].  Copied from CRM [id number]. Topic: Clinical - Medication Refill >> Oct 29, 2023  5:22 PM Paige D wrote: Medication: Azithromycin  250 mg   Pt states she has another sinus infection and would like a refill of her antibiotics.   Has the patient contacted their pharmacy? Yes (Agent: If no, request that the patient contact the pharmacy for the refill. If patient does not wish to contact the pharmacy document the reason why and proceed with request.) (Agent: If yes, when and what did the pharmacy advise?)  This is the patient's preferred pharmacy:  [facility] [id number] [place], [place] - [address] [place] [address] Phone: [phone] Fax: [phone]   Is this the correct pharmacy for this prescription? Yes If no, delete pharmacy and type the correct one.   Has the prescription been filled recently? Yes  Is the patient out of the medication? Yes  Has the patient been seen for an appointment in the last year OR does the patient have an upcoming appointment? Yes  Can we respond through MyChart? Yes  Agent: Please be advised that Rx refills may take up to 3 business days. We ask that you follow-up with your pharmacy.

## 2023-10-29 NOTE — Telephone Encounter (Signed)
 Copied from CRM [id number]. Topic: Clinical - Medication Refill >> Oct 29, 2023  3:24 PM Paige D wrote: Medication: Azithromycin  250 MG   Pt states she has another sinus infection and would like a refill of her antibiotics.   Has the patient contacted their pharmacy? No (Agent: If no, request that the patient contact the pharmacy for the refill. If patient does not wish to contact the pharmacy document the reason why and proceed with request.) (Agent: If yes, when and what did the pharmacy advise?)  This is the patient's preferred pharmacy:  [facility] [id number] [place], [place] - [address] [place] [address] Phone: [phone] Fax: [phone]   Is this the correct pharmacy for this prescription? Yes If no, delete pharmacy and type the correct one.   Has the prescription been filled recently? Yes  Is the patient out of the medication? Yes  Has the patient been seen for an appointment in the last year OR does the patient have an upcoming appointment? Yes  Can we respond through MyChart? No  Agent: Please be advised that Rx refills may take up to 3 business days. We ask that you follow-up with your pharmacy.

## 2023-10-29 NOTE — Telephone Encounter (Signed)
 FYI Only or Action Required?: Action required by provider: medication refill request.  Patient was last seen in primary care on 09/30/2023 by Johnny Garnette LABOR, MD.  Called Nurse Triage reporting PCP Call.  Symptoms began a week ago.  Interventions attempted: Prescription medications: Azithromycin  250 mg .  Symptoms are: unchanged.  Triage Disposition: Information or Advice Only Call  Patient/caregiver understands and will follow disposition?: No, wishes to speak with PCP                              Copied from CRM [id number]. Topic: Clinical - Medication Refill >> Oct 29, 2023  5:22 PM Paige D wrote: Medication: Azithromycin  250 mg   Pt states she has another sinus infection and would like a refill of her antibiotics.   Has the patient contacted their pharmacy? Yes (Agent: If no, request that the patient contact the pharmacy for the refill. If patient does not wish to contact the pharmacy document the reason why and proceed with request.) (Agent: If yes, when and what did the pharmacy advise?)  This is the patient's preferred pharmacy:  [facility] [id number] [place], [place] - [address] [place] [address] Phone: [phone] Fax: [phone]   Is this the correct pharmacy for this prescription? Yes If no, delete pharmacy and type the correct one.   Has the prescription been filled recently? Yes  Is the patient out of the medication? Yes  Has the patient been seen for an appointment in the last year OR does the patient have an upcoming appointment? Yes  Can we respond through MyChart? Yes  Agent: Please be advised that Rx refills may take up to 3 business days. We ask that you follow-up with your pharmacy. Reason for Disposition  [1] Other NON-URGENT information for PCP AND [2] does not require PCP response  Answer Assessment - Initial Assessment Questions 1. REASON FOR CALL or QUESTION: What is your reason for  calling today? or How can I best   Patient would like a refill on the Azithromycin  250 mg for a recurring sinus infection.  Protocols used: PCP Call - No Triage-A-AH

## 2023-10-29 NOTE — Telephone Encounter (Unsigned)
 Copied from CRM [id number]. Topic: Clinical - Medication Refill >> Oct 29, 2023  5:22 PM Paige D wrote: Medication: Azithromycin  250 mg   Pt states she has another sinus infection and would like a refill of her antibiotics.   Has the patient contacted their pharmacy? Yes (Agent: If no, request that the patient contact the pharmacy for the refill. If patient does not wish to contact the pharmacy document the reason why and proceed with request.) (Agent: If yes, when and what did the pharmacy advise?)  This is the patient's preferred pharmacy:  [facility] [id number] [place], [place] - [address] [place] [address] Phone: [phone] Fax: [phone]   Is this the correct pharmacy for this prescription? Yes If no, delete pharmacy and type the correct one.   Has the prescription been filled recently? Yes  Is the patient out of the medication? Yes  Has the patient been seen for an appointment in the last year OR does the patient have an upcoming appointment? Yes  Can we respond through MyChart? Yes  Agent: Please be advised that Rx refills may take up to 3 business days. We ask that you follow-up with your pharmacy.

## 2023-10-30 ENCOUNTER — Telehealth: Payer: Self-pay

## 2023-10-30 ENCOUNTER — Ambulatory Visit: Payer: Self-pay

## 2023-10-30 NOTE — Telephone Encounter (Signed)
 Done

## 2023-10-30 NOTE — Telephone Encounter (Signed)
 Copied from CRM [id number]. Topic: Clinical - Pink Word Triage >> Oct 29, 2023  5:55 PM Paige D wrote: Reason for Triage: Sinus infection >> Oct 29, 2023  5:57 PM Carlyon D wrote: Pt states she still has sinus infection still (Sinus Drip) Pt wasn't interested in speaking with a nurse in regards to her symptoms, Only wanted a refill on antibiotics.

## 2023-10-30 NOTE — Telephone Encounter (Signed)
This encounter was completed.

## 2023-10-30 NOTE — Telephone Encounter (Signed)
 Duplicate encounter. See telephone patient request.

## 2023-10-30 NOTE — Telephone Encounter (Signed)
This was sent in earlier today

## 2023-10-31 ENCOUNTER — Other Ambulatory Visit: Payer: Self-pay

## 2023-10-31 ENCOUNTER — Telehealth: Payer: Self-pay | Admitting: *Deleted

## 2023-10-31 MED ORDER — AZITHROMYCIN 250 MG PO TABS: ORAL_TABLET | ORAL | 0 refills | Status: DC

## 2023-10-31 NOTE — Telephone Encounter (Signed)
 Pt Rx was refax to [facility] per pt request

## 2023-10-31 NOTE — Addendum Note (Signed)
 Addended by: JOHNNY SENIOR A on: 10/31/2023 11:08 AM   Modules accepted: Orders

## 2023-10-31 NOTE — Telephone Encounter (Signed)
 Copied from CRM [id number]. Topic: Clinical - Prescription Issue >> Oct 31, 2023 11:51 AM Rea BROCKS wrote: Reason for CRM: azithromycin  (ZITHROMAX  Z-PAK) 250 MG tablet  Patient called in and asked for the prescription to be sent to [facility]. [facility].   [facility] [id number] [place], [place] - [address] [place] [address] Phone: [phone] Fax: [phone] Hours: Not open 24 hours

## 2023-11-08 ENCOUNTER — Encounter: Admitting: Family Medicine

## 2023-11-13 ENCOUNTER — Ambulatory Visit: Admitting: Family Medicine

## 2023-11-13 ENCOUNTER — Other Ambulatory Visit: Payer: Self-pay

## 2023-11-13 ENCOUNTER — Encounter: Payer: Self-pay | Admitting: Family Medicine

## 2023-11-13 ENCOUNTER — Other Ambulatory Visit (HOSPITAL_COMMUNITY): Payer: Self-pay

## 2023-11-13 ENCOUNTER — Other Ambulatory Visit (HOSPITAL_BASED_OUTPATIENT_CLINIC_OR_DEPARTMENT_OTHER): Payer: Self-pay

## 2023-11-13 VITALS — BP 120/64 | HR 78 | Temp 98.3°F | Ht 63.5 in | Wt 179.0 lb

## 2023-11-13 DIAGNOSIS — Z860100 Personal history of colon polyps, unspecified: Secondary | ICD-10-CM

## 2023-11-13 DIAGNOSIS — E539 Vitamin B deficiency, unspecified: Secondary | ICD-10-CM | POA: Diagnosis not present

## 2023-11-13 DIAGNOSIS — R0989 Other specified symptoms and signs involving the circulatory and respiratory systems: Secondary | ICD-10-CM | POA: Diagnosis not present

## 2023-11-13 DIAGNOSIS — Z0000 Encounter for general adult medical examination without abnormal findings: Secondary | ICD-10-CM | POA: Diagnosis not present

## 2023-11-13 MED ORDER — LEVOTHYROXINE SODIUM 125 MCG PO TABS
125.0000 ug | ORAL_TABLET | Freq: Every day | ORAL | 3 refills | Status: AC
  Filled 2023-11-13 – 2023-12-30 (×2): qty 90, 90d supply, fill #0
  Filled 2024-03-29: qty 90, 90d supply, fill #1
  Filled 2024-04-29 (×2): qty 90, 90d supply, fill #2

## 2023-11-13 MED ORDER — AMLODIPINE BESYLATE 5 MG PO TABS
5.0000 mg | ORAL_TABLET | Freq: Every day | ORAL | 3 refills | Status: AC
  Filled 2023-11-13: qty 90, 90d supply, fill #0
  Filled 2024-02-04: qty 90, 90d supply, fill #1
  Filled 2024-05-04: qty 90, 90d supply, fill #2

## 2023-11-13 MED ORDER — CYANOCOBALAMIN 1000 MCG/ML IJ SOLN
1000.0000 ug | INTRAMUSCULAR | 11 refills | Status: AC
Start: 2023-11-13 — End: ?
  Filled 2023-11-13: qty 6, 84d supply, fill #0
  Filled 2024-01-29: qty 6, 84d supply, fill #1
  Filled 2024-04-22: qty 6, 84d supply, fill #2

## 2023-11-13 MED ORDER — SYRINGE/NEEDLE (DISP) 25G X 1" 3 ML MISC
1.0000 | 2 refills | Status: AC
  Filled 2023-11-13: qty 10, 150d supply, fill #0
  Filled 2024-03-30: qty 10, 150d supply, fill #1

## 2023-11-13 NOTE — Progress Notes (Signed)
 Subjective:    Patient ID: Meghan Dixon, female    DOB: 22-Dec-1945, 78 y.o.   MRN: [id number]  HPI Here for a well exam. She has a few issues to discuss. First she asks to see an ENT specialist for her chronic sinus congestion and PND. She uses Mucinex  and Claritin daily with mixed results. She cannot use steroid nasal sprays because they give her a sore throat. Second she asks if we can increase the frequency of her B12 shots. Currently she gets a shot every 30 days. Her level on 10-24-23 was 414, but she says she stays tired all the time. Third she asks what she can do for mild swelling in the legs that comes and goes. She knows to elevated them as needed. Fourth she asks about whether or not to have another colonoscopy. Her last one was on 12-26-21, and a tubular adenoma was removed. She was told she did not need any more procedures, but both of us  have some concerns since this was the first time she has ever had a precancerous polyp removed.    Review of Systems  Constitutional:  Positive for fatigue.  HENT:  Positive for congestion and postnasal drip. Negative for ear pain, sinus pain and sore throat.   Eyes: Negative.   Respiratory: Negative.    Cardiovascular: Negative.   Gastrointestinal: Negative.   Genitourinary:  Negative for decreased urine volume, difficulty urinating, dyspareunia, dysuria, enuresis, flank pain, frequency, hematuria, pelvic pain and urgency.  Musculoskeletal: Negative.   Skin: Negative.   Neurological: Negative.  Negative for headaches.  Psychiatric/Behavioral: Negative.         Objective:   Physical Exam Constitutional:      General: She is not in acute distress.    Appearance: Normal appearance. She is well-developed.  HENT:     Head: Normocephalic and atraumatic.     Right Ear: External ear normal.     Left Ear: External ear normal.     Nose: Nose normal.     Mouth/Throat:     Pharynx: No oropharyngeal exudate.  Eyes:     General: No scleral  icterus.    Conjunctiva/sclera: Conjunctivae normal.     Pupils: Pupils are equal, round, and reactive to light.  Neck:     Thyroid : No thyromegaly.     Vascular: No JVD.  Cardiovascular:     Rate and Rhythm: Normal rate and regular rhythm.     Pulses: Normal pulses.     Heart sounds: Normal heart sounds. No murmur heard.    No friction rub. No gallop.  Pulmonary:     Effort: Pulmonary effort is normal. No respiratory distress.     Breath sounds: Normal breath sounds. No wheezing or rales.  Chest:     Chest wall: No tenderness.  Abdominal:     General: Bowel sounds are normal. There is no distension.     Palpations: Abdomen is soft. There is no mass.     Tenderness: There is no abdominal tenderness. There is no guarding or rebound.  Musculoskeletal:        General: No tenderness. Normal range of motion.     Cervical back: Normal range of motion and neck supple.  Lymphadenopathy:     Cervical: No cervical adenopathy.  Skin:    General: Skin is warm and dry.     Findings: No erythema or rash.  Neurological:     General: No focal deficit present.     Mental Status:  She is alert and oriented to person, place, and time.     Cranial Nerves: No cranial nerve deficit.     Motor: No abnormal muscle tone.     Coordination: Coordination normal.     Deep Tendon Reflexes: Reflexes are normal and symmetric. Reflexes normal.  Psychiatric:        Mood and Affect: Mood normal.        Behavior: Behavior normal.        Thought Content: Thought content normal.        Judgment: Judgment normal.           Assessment & Plan:  Well exam. We discussed diet and exercise. We went over the results from her recent lab work. We will increase the frequency of her B12 shots to every 14 days. We will refer her to ENT for the chronic sinus issues. She can wear compression stockings to help with leg swelling. We will refer her back to GI to discuss the possibility of having another colonoscopy.   Garnette Olmsted, MD

## 2023-11-14 ENCOUNTER — Other Ambulatory Visit (HOSPITAL_COMMUNITY): Payer: Self-pay

## 2023-11-14 ENCOUNTER — Other Ambulatory Visit: Payer: Self-pay

## 2023-11-14 ENCOUNTER — Other Ambulatory Visit (HOSPITAL_BASED_OUTPATIENT_CLINIC_OR_DEPARTMENT_OTHER): Payer: Self-pay

## 2023-11-26 ENCOUNTER — Other Ambulatory Visit (HOSPITAL_COMMUNITY): Payer: Self-pay

## 2023-11-27 ENCOUNTER — Encounter: Payer: Self-pay | Admitting: Gastroenterology

## 2023-11-28 ENCOUNTER — Telehealth: Payer: Self-pay

## 2023-11-28 NOTE — Telephone Encounter (Signed)
 The form is ready

## 2023-11-28 NOTE — Telephone Encounter (Signed)
 Copied from CRM [id number]. Topic: General - Other >> Nov 26, 2023  4:13 PM Sophia H wrote: Reason for CRM: Patient is requesting a letter for another handicap placard? States Dr. Johnny has done this for her before in the past and it is up this month, wanting to know if he could have this done by this Thursday since she will be in town. Requesting for another 6 months, please advise # [phone]

## 2023-12-02 ENCOUNTER — Telehealth: Payer: Self-pay

## 2023-12-02 NOTE — Telephone Encounter (Signed)
 Pt notified that Placard form is ready, pt states to mail it to her home address. Form placed in the out going mailing box.

## 2023-12-02 NOTE — Telephone Encounter (Signed)
 Hi Dr. Shila, This patient had a colonoscopy w/ Dr. Eda 12/2021, 5 polyps removed and repeat colon was not recommended d/t age. However, there was a new referral received on 7/30 for a colonoscopy ([facility]OCP) and she is now on your schedule on 01/21/24. What are your recommendations? Thank you, Prachi Oftedahl

## 2023-12-03 NOTE — Telephone Encounter (Signed)
 Please schedule office visit with me or APP to discuss. Only one of the four polyps was adenoma, normally would do surveillance in 7 years,  given her age Dr Eda did not recommend surveillance. Its been only 2 years since last colonoscopy.

## 2023-12-05 NOTE — Telephone Encounter (Signed)
 Telephone call to patient, VM obtained and message left to please call back SChaplin, RN,BSN

## 2023-12-05 NOTE — Telephone Encounter (Signed)
 Inbound call from patient returning phone call. Advised patient of Dr. Trenna recommendations and has been scheduled for a 10/8 office visit to discuss colonoscopy further. Please advise, thank you

## 2023-12-19 ENCOUNTER — Encounter

## 2023-12-30 ENCOUNTER — Other Ambulatory Visit: Payer: Self-pay

## 2023-12-30 ENCOUNTER — Other Ambulatory Visit (HOSPITAL_COMMUNITY): Payer: Self-pay

## 2024-01-17 ENCOUNTER — Other Ambulatory Visit (HOSPITAL_COMMUNITY): Payer: Self-pay

## 2024-01-21 ENCOUNTER — Encounter: Admitting: Gastroenterology

## 2024-01-21 NOTE — Progress Notes (Unsigned)
 Chief Complaint: History of adenomatous polyps  HPI:    Meghan Dixon is a 78 year old female with a past medical history as listed below, previously noted Dr. Eda, who was referred to me by Johnny Garnette LABOR, MD for a complaint of adenomatous polyps.      12/26/2021 colonoscopy (no polyps on prior colonoscopies 2003 or 2013) with findings of nonbleeding internal hemorrhoids, multiple small largemouth diverticula in the sigmoid colon, descending colon and ascending colon, 5 sessile polyps in the transverse colon, ascending colon and cecum, polyps were 2 to 3 mm in size.  At that point repeat was not recommended due to age.  Pathology showed 1 tubular adenoma and otherwise benign mucosa.    10/24/2023 CBC and BMP as well as TSH and hepatic function panel normal.  Hemoglobin A1c, T4 and T3 all normal.  D and B12 normal.  Past Medical History:  Diagnosis Date   Allergy    allergic rhinitis   Arthritis    foot   Cataract    COVID-19 virus infection 12/2019   HSV infection    Hypertension    Hyperthyroidism    Osteopenia     Past Surgical History:  Procedure Laterality Date   ABDOMINAL HYSTERECTOMY     for bleeding and ovarian cyst   APPENDECTOMY     CARPAL TUNNEL RELEASE     left   COLONOSCOPY  12/26/2021   per Dr. Eda, tubular adenoma, no repeats needed after age 45   ESOPHAGOGASTRODUODENOSCOPY  04/30/2011   per Dr. Teressa, gastritis, H pylori neg    FOOT SURGERY     bilateral   KNEE ARTHROSCOPY W/ MENISCAL REPAIR Right 2014   TONSILLECTOMY      Current Outpatient Medications  Medication Sig Dispense Refill   acetaminophen (TYLENOL) 650 MG CR tablet Take 650 mg by mouth every 8 (eight) hours as needed for pain.     amLODipine  (NORVASC ) 5 MG tablet Take 1 tablet (5 mg total) by mouth daily. 90 tablet 3   calcium carbonate (OSCAL) 1500 (600 Ca) MG TABS tablet Take 1,200 mg by mouth daily with breakfast.     Cholecalciferol (VITAMIN D ) 2000 units tablet Take 2,000 Units by  mouth daily. 40,000 units daily (takes 2 pills of 20,000 units)     cyanocobalamin  (VITAMIN B12) 1000 MCG/ML injection Inject 1 mL (1,000 mcg total) into the muscle every 14 (fourteen) days. 30 mL 11   fluticasone  (FLONASE ) 50 MCG/ACT nasal spray Place 2 sprays into both nostrils daily. (Patient taking differently: Place 2 sprays into both nostrils as needed.) 16 g 1   ibuprofen (ADVIL) 200 MG tablet Take 200 mg by mouth as needed.     levothyroxine  (SYNTHROID ) 125 MCG tablet Take 1 tablet (125 mcg total) by mouth daily. 90 tablet 3   loratadine (CLARITIN) 10 MG tablet Take 10 mg by mouth daily.     meclizine  (ANTIVERT ) 25 MG tablet Take 1 tablet (25 mg total) by mouth every 6 (six) hours as needed for dizziness. 120 tablet 3   meloxicam  (MOBIC ) 15 MG tablet Take 1 tablet (15 mg total) by mouth daily. (Patient taking differently: Take 15 mg by mouth as needed.) 90 tablet 3   Multiple Vitamin (MULTIVITAMIN) capsule Take 1 capsule by mouth daily.       SYRINGE-NEEDLE, DISP, 3 ML 25G X 1 3 ML MISC Use with B12 injection every 14 days. 50 each 2   Probiotic Product (ALIGN PO) Take by mouth daily.  promethazine  (PHENERGAN ) 25 MG tablet Take 1 tablet (25 mg total) by mouth every 6 (six) hours as needed for nausea or vomiting. 60 tablet 3   trolamine salicylate (ASPERCREME) 10 % cream Apply 1 application topically as needed for muscle pain.     TURMERIC PO Take 800 mg by mouth daily.     valACYclovir  (VALTREX ) 1000 MG tablet Take 1 tablet (1,000 mg total) by mouth daily. Take for 5 days. 30 tablet 3   vitamin E 180 MG (400 UNITS) capsule Take 400 Units by mouth daily as needed.     Zinc 100 MG TABS Take 1 tablet by mouth daily.     No current facility-administered medications for this visit.    Allergies as of 01/22/2024 - Review Complete 11/13/2023  Allergen Reaction Noted   Augmentin  [amoxicillin -pot clavulanate] Other (See Comments) 10/15/2013   Beta adrenergic blockers  10/21/2006    Cefuroxime axetil Hives 10/21/2006   Cetirizine hcl  10/21/2006   Fexofenadine  10/21/2006   Maxi-tuss cd [phenyleph-chlorphen-codeine ] Other (See Comments) 12/26/2021   Prednisone  10/21/2006   Topiramate Other (See Comments) 12/26/2021    Family History  Problem Relation Age of Onset   Heart disease Mother    Esophageal cancer Father    Cancer Father        esophageal CA caused death at 44   Alcohol abuse Father    Arthritis Sister        RA   Heart disease Brother        CAD   Colon cancer Brother 31   Lung cancer Brother    Breast cancer Cousin    Stomach cancer Neg Hx    Colon polyps Neg Hx    Rectal cancer Neg Hx     Social History   Socioeconomic History   Marital status: Married    Spouse name: Not on file   Number of children: 1   Years of education: 10   Highest education level: Not on file  Occupational History   Occupation: Retail buyer: [employer]  Tobacco Use   Smoking status: Never   Smokeless tobacco: Never  Vaping Use   Vaping status: Never Used  Substance and Sexual Activity   Alcohol use: No    Alcohol/week: 0.0 standard drinks of alcohol   Drug use: No   Sexual activity: Yes    Birth control/protection: Post-menopausal  Other Topics Concern   Not on file  Social History Narrative   Drinks about 4 caffeinated drinks a day   Social Drivers of Corporate investment banker Strain: Low Risk  (08/12/2019)   Overall Financial Resource Strain (CARDIA)    Difficulty of Paying Living Expenses: Not hard at all  Food Insecurity: Not on file  Transportation Needs: No Transportation Needs (08/12/2019)   PRAPARE - Administrator, Civil Service (Medical): No    Lack of Transportation (Non-Medical): No  Physical Activity: Not on file  Stress: Not on file  Social Connections: Not on file  Intimate Partner Violence: Not on file    Review of Systems:    Constitutional: No weight loss, fever, chills, weakness or  fatigue HEENT: Eyes: No change in vision               Ears, Nose, Throat:  No change in hearing or congestion Skin: No rash or itching Cardiovascular: No chest pain, chest pressure or palpitations   Respiratory: No SOB or cough Gastrointestinal: See  HPI and otherwise negative Genitourinary: No dysuria or change in urinary frequency Neurological: No headache, dizziness or syncope Musculoskeletal: No new muscle or joint pain Hematologic: No bleeding or bruising Psychiatric: No history of depression or anxiety    Physical Exam:  Vital signs: There were no vitals taken for this visit.  Constitutional:   Pleasant Caucasian female appears to be in NAD, Well developed, Well nourished, alert and cooperative Head:  Normocephalic and atraumatic. Eyes:   PEERL, EOMI. No icterus. Conjunctiva pink. Ears:  Normal auditory acuity. Neck:  Supple Throat: Oral cavity and pharynx without inflammation, swelling or lesion.  Respiratory: Respirations even and unlabored. Lungs clear to auscultation bilaterally.   No wheezes, crackles, or rhonchi.  Cardiovascular: Normal S1, S2. No MRG. Regular rate and rhythm. No peripheral edema, cyanosis or pallor.  Gastrointestinal:  Soft, nondistended, nontender. No rebound or guarding. Normal bowel sounds. No appreciable masses or hepatomegaly. Rectal:  Not performed.  Msk:  Symmetrical without gross deformities. Without edema, no deformity or joint abnormality.  Neurologic:  Alert and  oriented x4;  grossly normal neurologically.  Skin:   Dry and intact without significant lesions or rashes. Psychiatric: Oriented to person, place and time. Demonstrates good judgement and reason without abnormal affect or behaviors.  RELEVANT LABS AND IMAGING: CBC    Component Value Date/Time   WBC 5.0 10/24/2023 0956   RBC 4.51 10/24/2023 0956   HGB 13.1 10/24/2023 0956   HCT 40.5 10/24/2023 0956   PLT 261.0 10/24/2023 0956   MCV 90.0 10/24/2023 0956   MCH 29.7 10/23/2019  0937   MCHC 32.4 10/24/2023 0956   RDW 14.2 10/24/2023 0956   LYMPHSABS 1.0 10/24/2023 0956   MONOABS 0.5 10/24/2023 0956   EOSABS 0.1 10/24/2023 0956   BASOSABS 0.0 10/24/2023 0956    CMP     Component Value Date/Time   NA 140 10/24/2023 0956   K 4.0 10/24/2023 0956   CL 105 10/24/2023 0956   CO2 29 10/24/2023 0956   GLUCOSE 96 10/24/2023 0956   BUN 11 10/24/2023 0956   CREATININE 0.63 10/24/2023 0956   CREATININE 0.72 10/23/2019 0937   CALCIUM 9.3 10/24/2023 0956   PROT 7.5 10/24/2023 0956   ALBUMIN 4.4 10/24/2023 0956   AST 21 10/24/2023 0956   ALT 17 10/24/2023 0956   ALKPHOS 68 10/24/2023 0956   BILITOT 0.5 10/24/2023 0956   GFRNONAA 90 (L) 05/06/2011 0757   GFRAA >90 05/06/2011 0757    Assessment: 1.  History of adenomatous polyps: Last colonoscopy in September 2023 with 5 polyps, but only 1 was adenomatous, no repeat recommended due to age  Plan: 1. ***     Delon Failing, PA-C [facility] 01/21/2024, 3:16 PM  Cc: Johnny Garnette LABOR, MD

## 2024-01-22 ENCOUNTER — Ambulatory Visit: Admitting: Physician Assistant

## 2024-01-22 ENCOUNTER — Encounter: Payer: Self-pay | Admitting: Physician Assistant

## 2024-01-22 VITALS — BP 136/80 | HR 90 | Ht 63.5 in | Wt 180.0 lb

## 2024-01-22 DIAGNOSIS — Z860101 Personal history of adenomatous and serrated colon polyps: Secondary | ICD-10-CM | POA: Diagnosis not present

## 2024-01-23 ENCOUNTER — Ambulatory Visit: Payer: Self-pay

## 2024-01-23 NOTE — Telephone Encounter (Signed)
 Patient called, left VM to return the call to the office to speak to NT.     Reason for Triage: Patient is experiencing a running nose, sore throat, ear ache, sinus pressure. Takes claritin and mucinex  and worked somewhat. Symptoms started the end of last week. She requesting Dr. Johnny to call her a prescription in to the pharmacy. [facility].   Call back #[phone]

## 2024-01-23 NOTE — Telephone Encounter (Signed)
   FYI Only or Action Required?: Action required by provider: Requesting medication.  Patient was last seen in primary care on 11/13/2023 by Johnny Garnette LABOR, MD.  Called Nurse Triage reporting Facial Pain.  Symptoms began several days ago.  Interventions attempted: OTC medications: Mucinex , Claritin and Rest, hydration, or home remedies.  Symptoms are: unchanged.  Triage Disposition: Home Care  Patient/caregiver understands and will follow disposition?: Yes  Reason for Disposition  [1] Sinus congestion as part of a cold AND [2] present < 10 days  Answer Assessment - Initial Assessment Questions Taking Claritin and Mucinex . Patient has an appointment next week with Dr. Karis, Otolarynology. Requesting PCP [facility] [place], Patient requesting call back: [phone]    1. LOCATION: Where does it hurt?      Cheekbones   2. ONSET: When did the sinus pain start?  (e.g., hours, days)      Over the weekend  3. SEVERITY: How bad is the pain?   (Scale 0-10; or none, mild, moderate or severe)     Moderate  4. RECURRENT SYMPTOM: Have you ever had sinus problems before? If Yes, ask: When was the last time? and What happened that time?      Yes happened before  5. FEVER: Do you have a fever? If Yes, ask: What is it, how was it measured, and when did it start?      Denies  6. OTHER SYMPTOMS: Do you have any other symptoms? (e.g., sore throat, cough, earache, difficulty breathing)     Cough, earache, watery eyes  Protocols used: Sinus Pain or Congestion-A-AH    Message from Kevelyn M sent at 01/23/2024  4:37 PM EDT  Reason for Triage: Patient is experiencing a running nose, sore throat, ear ache, sinus pressure. Takes claritin and mucinex  and worked somewhat. Symptoms started the end of last week. She requesting Dr. Johnny to call her a prescription in to the pharmacy. She uses the [facility].  Call back #[phone]

## 2024-01-24 ENCOUNTER — Other Ambulatory Visit: Payer: Self-pay

## 2024-01-24 MED ORDER — AZITHROMYCIN 250 MG PO TABS: ORAL_TABLET | ORAL | 0 refills | Status: AC

## 2024-01-24 NOTE — Telephone Encounter (Signed)
 Rx sent and pt notified.

## 2024-01-24 NOTE — Telephone Encounter (Signed)
Please call in a Zpack  °

## 2024-01-29 ENCOUNTER — Telehealth (INDEPENDENT_AMBULATORY_CARE_PROVIDER_SITE_OTHER): Payer: Self-pay | Admitting: Otolaryngology

## 2024-01-29 ENCOUNTER — Ambulatory Visit (INDEPENDENT_AMBULATORY_CARE_PROVIDER_SITE_OTHER): Admitting: Otolaryngology

## 2024-01-29 ENCOUNTER — Other Ambulatory Visit (HOSPITAL_COMMUNITY): Payer: Self-pay

## 2024-01-29 VITALS — BP 131/83 | Temp 97.9°F | Ht 64.0 in | Wt 180.0 lb

## 2024-01-29 DIAGNOSIS — H9190 Unspecified hearing loss, unspecified ear: Secondary | ICD-10-CM

## 2024-01-29 DIAGNOSIS — J342 Deviated nasal septum: Secondary | ICD-10-CM

## 2024-01-29 DIAGNOSIS — H6980 Other specified disorders of Eustachian tube, unspecified ear: Secondary | ICD-10-CM | POA: Diagnosis not present

## 2024-01-29 DIAGNOSIS — J310 Chronic rhinitis: Secondary | ICD-10-CM | POA: Diagnosis not present

## 2024-01-29 DIAGNOSIS — R0981 Nasal congestion: Secondary | ICD-10-CM

## 2024-01-29 DIAGNOSIS — R0982 Postnasal drip: Secondary | ICD-10-CM

## 2024-01-29 DIAGNOSIS — H9319 Tinnitus, unspecified ear: Secondary | ICD-10-CM

## 2024-01-29 DIAGNOSIS — H9313 Tinnitus, bilateral: Secondary | ICD-10-CM | POA: Insufficient documentation

## 2024-01-29 DIAGNOSIS — J343 Hypertrophy of nasal turbinates: Secondary | ICD-10-CM | POA: Insufficient documentation

## 2024-01-29 DIAGNOSIS — H6983 Other specified disorders of Eustachian tube, bilateral: Secondary | ICD-10-CM | POA: Insufficient documentation

## 2024-01-29 MED ORDER — IPRATROPIUM BROMIDE 0.06 % NA SOLN: 2.0000 | Freq: Two times a day (BID) | NASAL | 12 refills | Status: AC | PRN

## 2024-01-29 NOTE — Progress Notes (Signed)
 CC: Bilateral tinnitus, chronic sinus issues  Discussed the use of AI scribe software for clinical note transcription with the patient, who gave verbal consent to proceed.  History of Present Illness Meghan Dixon is a 78 year old female who presents with chronic sinus and ear symptoms.  She experiences constant tinnitus, described as ringing in the ears, along with a crackling sound and pressure sensation. These symptoms have persisted for over two years.   She has chronic nasal drainage and a persistent cough, for which she takes Mucinex . She recently recovered from a sinus infection treated with a five-day course of antibiotics. During sinus infections, she experiences facial pain and pressure, nasal congestion, and difficulty breathing through her nose. These symptoms are chronic and not seasonal, as she does not notice a difference in symptoms between spring and fall.  She has not had a recent scan of her sinuses, and her last hearing test was conducted years ago. She uses Claritin for allergies and has tried Flonase  nasal spray, but discontinued it due to a sore throat. She has also tried other steroid nasal sprays with similar results. She has not undergone any ear, nose, or throat surgeries and denies any facial or nasal trauma.     Past Medical History:  Diagnosis Date   Allergy    allergic rhinitis   Arthritis    foot   Cataract    COVID-19 virus infection 12/2019   HSV infection    Hypertension    Hyperthyroidism    Osteopenia     Past Surgical History:  Procedure Laterality Date   ABDOMINAL HYSTERECTOMY     for bleeding and ovarian cyst   APPENDECTOMY     CARPAL TUNNEL RELEASE     left   COLONOSCOPY  12/26/2021   per Dr. Eda, tubular adenoma, no repeats needed after age 83   ESOPHAGOGASTRODUODENOSCOPY  04/30/2011   per Dr. Teressa, gastritis, H pylori neg    FOOT SURGERY     bilateral   KNEE ARTHROSCOPY W/ MENISCAL REPAIR Right 2014   TONSILLECTOMY       Family History  Problem Relation Age of Onset   Heart disease Mother    Esophageal cancer Father    Cancer Father        esophageal CA caused death at 24   Alcohol abuse Father    Arthritis Sister        RA   Heart disease Brother        CAD   Colon cancer Brother 54   Lung cancer Brother    Breast cancer Cousin    Stomach cancer Neg Hx    Colon polyps Neg Hx    Rectal cancer Neg Hx     Social History:  reports that she has never smoked. She has never used smokeless tobacco. She reports that she does not drink alcohol and does not use drugs.  Allergies:  Allergies  Allergen Reactions   Augmentin  [Amoxicillin -Pot Clavulanate] Other (See Comments)    Extreme heartburn   Beta Adrenergic Blockers     REACTION: reaction not known   Cefuroxime Axetil Hives    Throat closes   Cetirizine Hcl     REACTION: dizzy   Fexofenadine     REACTION: dizzy   Maxi-Tuss Cd [Phenyleph-Chlorphen-Codeine ] Other (See Comments)    Felt like I was unable to wake up med-chlorpheniamine/hydrocodone    Prednisone     Mood changer with higher doses    Topiramate Other (See Comments)  Felt like I couldn't wake up    Prior to Admission medications   Medication Sig Start Date End Date Taking? Authorizing Provider  acetaminophen (TYLENOL) 650 MG CR tablet Take 650 mg by mouth every 8 (eight) hours as needed for pain.   Yes [provider]  amLODipine  (NORVASC ) 5 MG tablet Take 1 tablet (5 mg total) by mouth daily. 11/13/23  Yes Johnny Garnette LABOR, MD  azithromycin  (ZITHROMAX ) 250 MG tablet Take 2 tablets on day 1, then 1 tablet daily on days 2 through 5 01/24/24 01/29/24 Yes Johnny Garnette LABOR, MD  calcium carbonate (OSCAL) 1500 (600 Ca) MG TABS tablet Take 1,200 mg by mouth daily with breakfast.   Yes [provider]  Cholecalciferol (VITAMIN D ) 2000 units tablet Take 2,000 Units by mouth daily. 40,000 units daily (takes 2 pills of 20,000 units)   Yes [provider]   cyanocobalamin  (VITAMIN B12) 1000 MCG/ML injection Inject 1 mL (1,000 mcg total) into the muscle every 14 (fourteen) days. 11/13/23  Yes Johnny Garnette LABOR, MD  fluticasone  (FLONASE ) 50 MCG/ACT nasal spray Place 2 sprays into both nostrils daily. Patient taking differently: Place 2 sprays into both nostrils as needed. 05/27/23  Yes Billy Philippe SAUNDERS, NP  ibuprofen (ADVIL) 200 MG tablet Take 200 mg by mouth as needed.   Yes [provider]  ipratropium (ATROVENT) 0.06 % nasal spray Place 2 sprays into both nostrils 2 (two) times daily as needed (drainage). 01/29/24  Yes Karis Clunes, MD  levothyroxine  (SYNTHROID ) 125 MCG tablet Take 1 tablet (125 mcg total) by mouth daily. 11/13/23  Yes Johnny Garnette LABOR, MD  loratadine (CLARITIN) 10 MG tablet Take 10 mg by mouth daily.   Yes [provider]  meclizine  (ANTIVERT ) 25 MG tablet Take 1 tablet (25 mg total) by mouth every 6 (six) hours as needed for dizziness. 11/29/22  Yes Johnny Garnette LABOR, MD  meloxicam  (MOBIC ) 15 MG tablet Take 1 tablet (15 mg total) by mouth daily. Patient taking differently: Take 15 mg by mouth as needed. Pt takes 3 to 5 days and then she stops 10/27/21  Yes Johnny Garnette LABOR, MD  Multiple Vitamin (MULTIVITAMIN) capsule Take 1 capsule by mouth daily.     Yes [provider]  Probiotic Product (ALIGN PO) Take by mouth daily.   Yes [provider]  promethazine  (PHENERGAN ) 25 MG tablet Take 1 tablet (25 mg total) by mouth every 6 (six) hours as needed for nausea or vomiting. 11/07/22  Yes Johnny Garnette LABOR, MD  SYRINGE-NEEDLE, DISP, 3 ML 25G X 1 3 ML MISC Use with B12 injection every 14 days. 11/13/23  Yes Johnny Garnette LABOR, MD  trolamine salicylate (ASPERCREME) 10 % cream Apply 1 application topically as needed for muscle pain.   Yes [provider]  TURMERIC PO Take 800 mg by mouth daily.   Yes [provider]  valACYclovir  (VALTREX ) 1000 MG tablet Take 1 tablet (1,000 mg total) by mouth daily. Take for  5 days. 11/07/22  Yes Johnny Garnette LABOR, MD  vitamin E 180 MG (400 UNITS) capsule Take 400 Units by mouth daily as needed.   Yes [provider]  Zinc 100 MG TABS Take 1 tablet by mouth daily.   Yes [provider]    Blood pressure 131/83, temperature 97.9 F (36.6 C), temperature source Oral, height 5' 4 (1.626 m), weight 180 lb (81.6 kg), SpO2 93%. Exam: General: Communicates without difficulty, well nourished, no acute distress. Head: Normocephalic, no  evidence injury, no tenderness, facial buttresses intact without stepoff. Face/sinus: No tenderness to palpation and percussion. Facial movement is normal and symmetric. Eyes: PERRL, EOMI. No scleral icterus, conjunctivae clear. Neuro: CN II exam reveals vision grossly intact.  No nystagmus at any point of gaze. Ears: Auricles well formed without lesions.  Ear canals are intact without mass or lesion.  No erythema or edema is appreciated.  The TMs are intact without fluid. Nose: External evaluation reveals normal support and skin without lesions.  Dorsum is intact.  Anterior rhinoscopy reveals congested mucosa over anterior aspect of inferior turbinates and intact septum.  No purulence noted. Oral:  Oral cavity and oropharynx are intact, symmetric, without erythema or edema.  Mucosa is moist without lesions. Neck: Full range of motion without pain.  There is no significant lymphadenopathy.  No masses palpable.  Thyroid  bed within normal limits to palpation.  Parotid glands and submandibular glands equal bilaterally without mass.  Trachea is midline. Neuro:  CN 2-12 grossly intact.   Procedure:  Flexible Nasal Endoscopy: Description: Risks, benefits, and alternatives of flexible endoscopy were explained to the patient.  Specific mention was made of the risk of throat numbness with difficulty swallowing, possible bleeding from the nose and mouth, and pain from the procedure.  The patient gave oral consent to proceed.  The flexible scope was  inserted into the right nasal cavity.  Endoscopy of the interior nasal cavity, superior, inferior, and middle meatus was performed. The sphenoid-ethmoid recess was examined. Edematous mucosa was noted.  No polyp, mass, or lesion was appreciated. Nasal septal deviation noted. Olfactory cleft was clear.  Nasopharynx was clear.  Turbinates were hypertrophied but without mass.  The procedure was repeated on the contralateral side with similar findings.  The patient tolerated the procedure well.   Assessment and Plan Assessment & Plan Chronic rhinitis with nasal congestion and drainage Chronic rhinitis with nasal congestion and drainage for over two years, with symptoms of facial pain, pressure, and chronic nasal drainage. Examination reveals nasal septal deviation and bilateral inferior turbinate hypertrophy, contributing to nasal congestion. No evidence of polyps or infection. - Prescribe Atrovent nasal spray, 2 sprays in each nostril once or twice daily as needed for drainage. - Recommend nasal irrigation with saline solution as needed. - Continue Claritin for allergy symptoms if present.  Eustachian tube dysfunction Eustachian tube dysfunction secondary to nasal congestion, with symptoms of crackling and popping sounds in the ears. Examination shows no infection or fluid in the ears. - Use Atrovent nasal spray as prescribed. - Nasal saline irrigation is encouraged. - Recommend Tylenol or Motrin for any associated ear discomfort.  Tinnitus and hearing loss Tinnitus likely secondary to hearing loss, a common condition with aging.  - The strategies to cope with tinnitus, including the use of masker, hearing aids, tinnitus retraining therapy, and avoidance of caffeine and alcohol are discussed. - Plan to test hearing at follow-up visit to assess the extent of hearing loss.   Nashley Cordoba W Prynce Jacober 01/29/2024, 1:11 PM

## 2024-01-29 NOTE — Telephone Encounter (Signed)
 I was made aware that Dr Karis would like this patient to have a hearing test scheduled after she had checked out.  I left a detailed message on her voicemail to call us  back to get scheduled for the hearing test.

## 2024-02-07 ENCOUNTER — Ambulatory Visit: Payer: Self-pay | Admitting: Family Medicine

## 2024-02-07 ENCOUNTER — Encounter: Payer: Self-pay | Admitting: Family Medicine

## 2024-02-07 ENCOUNTER — Ambulatory Visit (INDEPENDENT_AMBULATORY_CARE_PROVIDER_SITE_OTHER): Admitting: Family Medicine

## 2024-02-07 VITALS — BP 118/78 | HR 74 | Temp 98.1°F | Wt 184.0 lb

## 2024-02-07 DIAGNOSIS — R21 Rash and other nonspecific skin eruption: Secondary | ICD-10-CM

## 2024-02-07 LAB — CBC WITH DIFFERENTIAL/PLATELET
Basophils Absolute: 0 K/uL (ref 0.0–0.1)
Basophils Relative: 0.7 % (ref 0.0–3.0)
Eosinophils Absolute: 0.1 K/uL (ref 0.0–0.7)
Eosinophils Relative: 2.2 % (ref 0.0–5.0)
HCT: 41.3 % (ref 36.0–46.0)
Hemoglobin: 13.6 g/dL (ref 12.0–15.0)
Lymphocytes Relative: 19.7 % (ref 12.0–46.0)
Lymphs Abs: 1.4 K/uL (ref 0.7–4.0)
MCHC: 32.8 g/dL (ref 30.0–36.0)
MCV: 91.5 fl (ref 78.0–100.0)
Monocytes Absolute: 0.7 K/uL (ref 0.1–1.0)
Monocytes Relative: 10.6 % (ref 3.0–12.0)
Neutro Abs: 4.6 K/uL (ref 1.4–7.7)
Neutrophils Relative %: 66.8 % (ref 43.0–77.0)
Platelets: 244 K/uL (ref 150.0–400.0)
RBC: 4.51 Mil/uL (ref 3.87–5.11)
RDW: 14.3 % (ref 11.5–15.5)
WBC: 6.9 K/uL (ref 4.0–10.5)

## 2024-02-07 NOTE — Progress Notes (Signed)
   Subjective:    Patient ID: Meghan Dixon, female    DOB: Mar 01, 1946, 78 y.o.   MRN: [id number]  HPI Here for a rash on the left lower leg that appeared 4 days ago. This came up suddenly, and the first day is was bright red in color. Since then it has been steadily fading away. There has been no discomfort or itching. No other areas of her skin are involved. She has not recently changed detergents or lotions or soaps. She feels fine in general.    Review of Systems  Constitutional: Negative.   Respiratory: Negative.    Cardiovascular: Negative.   Skin:  Positive for rash.       Objective:   Physical Exam Constitutional:      Appearance: Normal appearance.  Cardiovascular:     Rate and Rhythm: Normal rate and regular rhythm.     Pulses: Normal pulses.     Heart sounds: Normal heart sounds.  Pulmonary:     Effort: Pulmonary effort is normal.     Breath sounds: Normal breath sounds.  Musculoskeletal:     Comments: Left lower leg has a 6 cm wide area of macular erythema above the medial ankle that is petechial in appearance. There is also a much smaller area like this above the lateral ankle. No warmth or tenderness. The rash does not blanch.   Neurological:     Mental Status: She is alert.           Assessment & Plan:  She has rash on the left lower leg,the etiology of which is unknown. We will get a CBC today to check the platelet count. This seems to be resolving, so she will return of anything else develops  Garnette Olmsted, MD

## 2024-03-11 ENCOUNTER — Other Ambulatory Visit: Payer: Self-pay

## 2024-03-11 ENCOUNTER — Other Ambulatory Visit (HOSPITAL_COMMUNITY): Payer: Self-pay

## 2024-03-11 ENCOUNTER — Other Ambulatory Visit: Payer: Self-pay | Admitting: Family Medicine

## 2024-03-11 DIAGNOSIS — B009 Herpesviral infection, unspecified: Secondary | ICD-10-CM

## 2024-03-11 MED ORDER — VALACYCLOVIR HCL 1 G PO TABS
1000.0000 mg | ORAL_TABLET | Freq: Every day | ORAL | 3 refills | Status: AC
  Filled 2024-03-11 (×2): qty 30, 30d supply, fill #0

## 2024-03-11 NOTE — Telephone Encounter (Signed)
 Copied from CRM #[id number]. Topic: Clinical - Medication Refill >> Mar 11, 2024  8:05 AM Thersia C wrote: Medication: valACYclovir  (VALTREX ) 1000 MG tablet - patient leaving out of town on Monday and would like to be able to pick it up before then  Has the patient contacted their pharmacy? Yes (Agent: If no, request that the patient contact the pharmacy for the refill. If patient does not wish to contact the pharmacy document the reason why and proceed with request.) (Agent: If yes, when and what did the pharmacy advise?)  This is the patient's preferred pharmacy:  [place] [facility] [address] [place] [address] Phone: [phone] Fax: [phone]  Is this the correct pharmacy for this prescription? Yes If no, delete pharmacy and type the correct one.   Has the prescription been filled recently? No  Is the patient out of the medication? Yes  Has the patient been seen for an appointment in the last year OR does the patient have an upcoming appointment? Yes  Can we respond through MyChart? Yes  Agent: Please be advised that Rx refills may take up to 3 business days. We ask that you follow-up with your pharmacy.

## 2024-03-13 ENCOUNTER — Other Ambulatory Visit: Payer: Self-pay

## 2024-03-23 ENCOUNTER — Other Ambulatory Visit: Payer: Self-pay | Admitting: Family Medicine

## 2024-03-23 ENCOUNTER — Ambulatory Visit: Payer: Self-pay

## 2024-03-23 MED ORDER — MOLNUPIRAVIR EUA 200MG CAPSULE: 4.0000 | ORAL_CAPSULE | Freq: Two times a day (BID) | ORAL | 0 refills | Status: AC

## 2024-03-23 NOTE — Telephone Encounter (Signed)
 I sent in Molnupiravir  for her husband as well

## 2024-03-23 NOTE — Telephone Encounter (Signed)
 FYI Only or Action Required?: Action required by provider: clinical question for provider. Patient requesting Lagevrio  200 mg as prescribed for her husband in 2022 for Covid. Pt declines OV or VV. This RN educated pt on home care, new-worsening symptoms, when to call back/seek emergent care. Pt verbalized understanding and agrees to plan.   Patient was last seen in primary care on 02/07/2024 by Johnny Garnette LABOR, MD.  Called Nurse Triage reporting Covid Positive.  Symptoms began several days ago.  Interventions attempted: OTC medications: Mucinex .  Symptoms are: gradually improving.  Triage Disposition: Call PCP Within 24 Hours  Patient/caregiver understands and will follow disposition?: Yes     Reason for Disposition  [1] HIGH RISK patient (e.g., weak immune system, 65 years and older, obesity with BMI 30 or higher, pregnant, chronic lung disease) AND [2] COVID symptoms (e.g., cough, fever)  (Exceptions: Already seen by PCP and no new or worsening symptoms.)  Answer Assessment - Initial Assessment Questions 1. SYMPTOMS: What is your main symptom or concern? (e.g., cough, fever, shortness of breath, muscle aches)     HA, vertigo, chills, body aches sweating, ST, earache 2. ONSET: When did the symptoms start?      Friday 3. COUGH: Do you have a cough? If Yes, ask: How bad is the cough?       Yes,  4. FEVER: Do you have a fever? If Yes, ask: What is your temperature, how was it measured, and when did it start?     Chills, sweats 5. BREATHING DIFFICULTY: Are you having any difficulty breathing? (e.g., normal; shortness of breath, wheezing, unable to speak)      None 6. BETTER-SAME-WORSE: Are you getting better, staying the same or getting worse compared to yesterday?  If getting worse, ask, In what way?     Better 7. OTHER SYMPTOMS: Do you have any other symptoms?  (e.g., chills, fatigue, headache, loss of smell or taste, muscle pain, sore throat)     ST, body  aches, sweating, chills, fatigue, HA 8. COVID-19 DIAGNOSIS: How do you know that you have COVID? (e.g., positive lab test or self-test, diagnosed by doctor or NP/PA, symptoms after exposure).     Pt took home test and positive 9. COVID-19 EXPOSURE: Was there any known exposure to COVID before the symptoms began?      Unknow, just returned from vacation 10. COVID-19 VACCINE: Have you had the COVID-19 vaccine? If Yes, ask: When did you last get it?       No 11. HIGH RISK DISEASE: Do you have any chronic medical problems? (e.g., asthma, heart or lung disease, weak immune system, obesity, etc.)       HTN 13. O2 SATURATION MONITOR:  Do you use an oxygen saturation monitor (pulse oximeter) at home? If Yes, ask What is your reading (oxygen level) today? What is your usual oxygen saturation reading? (e.g., 95%)       95%  Protocols used: COVID-19 - Diagnosed or Suspected-A-AH

## 2024-03-23 NOTE — Telephone Encounter (Signed)
 Follow up with pt. Inform pt that Rx was sent. Pt is requesting a Rx for her spouse as well- Meghan Dixon. Pt was upset that she had told someone several time regarding to sending Rx in for spouse as well. Inform pt she is note for her but not in her husband. Pt reports spouse sx started on Saturday. Sx bodyache, sore throat, with chills, fever, coughing clear phlegm. Tested positive covid today.

## 2024-03-23 NOTE — Telephone Encounter (Signed)
 Spoke to pt. Pt is aware that both Rx has sent and stated thank you!

## 2024-03-23 NOTE — Progress Notes (Signed)
 Done

## 2024-03-23 NOTE — Telephone Encounter (Signed)
 I sent in the RX (also known as Molnupiravir )

## 2024-03-23 NOTE — Telephone Encounter (Signed)
 Attempted to contact patient x 1 to discuss symptoms; LVM to return call, Will attempt to contact patient at a later time to further discuss concerns.            Message from Ponce de Leon H sent at 03/23/2024  8:46 AM EST  Summary: Positive COVID   Reason for Triage: Just returned from vacation and used an old COVID test and came back positive, having cough, slight temperature and body aches wants to know if a prescription for Lagevrio  200 mg can be called in states they gave her husband that previously when he had COVID, or if something can be called in.

## 2024-03-30 ENCOUNTER — Ambulatory Visit (INDEPENDENT_AMBULATORY_CARE_PROVIDER_SITE_OTHER): Admitting: Otolaryngology

## 2024-03-30 ENCOUNTER — Other Ambulatory Visit (HOSPITAL_COMMUNITY): Payer: Self-pay

## 2024-04-02 ENCOUNTER — Other Ambulatory Visit (HOSPITAL_COMMUNITY): Payer: Self-pay

## 2024-04-02 MED ORDER — ACYCLOVIR 5 % EX OINT
1.0000 | TOPICAL_OINTMENT | CUTANEOUS | 1 refills | Status: AC
  Filled 2024-04-02 – 2024-04-04 (×2): qty 5, 1d supply, fill #0

## 2024-04-03 ENCOUNTER — Other Ambulatory Visit: Payer: Self-pay

## 2024-04-04 ENCOUNTER — Other Ambulatory Visit (HOSPITAL_COMMUNITY): Payer: Self-pay

## 2024-04-06 ENCOUNTER — Other Ambulatory Visit (HOSPITAL_COMMUNITY): Payer: Self-pay

## 2024-04-07 ENCOUNTER — Other Ambulatory Visit (HOSPITAL_COMMUNITY): Payer: Self-pay

## 2024-04-07 MED ORDER — ACYCLOVIR 5 % EX OINT
TOPICAL_OINTMENT | CUTANEOUS | 1 refills | Status: AC
  Filled 2024-04-07: qty 15, 15d supply, fill #0
  Filled 2024-04-18 – 2024-04-20 (×2): qty 15, 15d supply, fill #1

## 2024-04-08 ENCOUNTER — Other Ambulatory Visit: Payer: Self-pay

## 2024-04-08 ENCOUNTER — Other Ambulatory Visit (HOSPITAL_COMMUNITY): Payer: Self-pay

## 2024-04-18 ENCOUNTER — Other Ambulatory Visit (HOSPITAL_COMMUNITY): Payer: Self-pay

## 2024-04-20 ENCOUNTER — Other Ambulatory Visit: Payer: Self-pay

## 2024-04-22 ENCOUNTER — Other Ambulatory Visit (HOSPITAL_COMMUNITY): Payer: Self-pay

## 2024-04-29 ENCOUNTER — Other Ambulatory Visit (HOSPITAL_COMMUNITY): Payer: Self-pay

## 2024-05-19 ENCOUNTER — Ambulatory Visit: Payer: Self-pay

## 2024-05-19 ENCOUNTER — Encounter: Payer: Self-pay | Admitting: Family Medicine

## 2024-05-19 ENCOUNTER — Other Ambulatory Visit (HOSPITAL_COMMUNITY): Payer: Self-pay

## 2024-05-19 ENCOUNTER — Ambulatory Visit: Admitting: Family Medicine

## 2024-05-19 ENCOUNTER — Ambulatory Visit (INDEPENDENT_AMBULATORY_CARE_PROVIDER_SITE_OTHER)

## 2024-05-19 ENCOUNTER — Ambulatory Visit: Payer: Self-pay | Admitting: Family Medicine

## 2024-05-19 VITALS — BP 138/82 | HR 86 | Temp 97.6°F | Resp 16 | Ht 64.0 in | Wt 185.8 lb

## 2024-05-19 DIAGNOSIS — I499 Cardiac arrhythmia, unspecified: Secondary | ICD-10-CM

## 2024-05-19 DIAGNOSIS — R053 Chronic cough: Secondary | ICD-10-CM | POA: Diagnosis not present

## 2024-05-19 MED ORDER — ALBUTEROL SULFATE HFA 108 (90 BASE) MCG/ACT IN AERS
2.0000 | INHALATION_SPRAY | Freq: Four times a day (QID) | RESPIRATORY_TRACT | 0 refills | Status: AC | PRN
  Filled 2024-05-19: qty 6.7, 25d supply, fill #0

## 2024-05-19 MED ORDER — BENZONATATE 100 MG PO CAPS
100.0000 mg | ORAL_CAPSULE | Freq: Two times a day (BID) | ORAL | 0 refills | Status: AC | PRN
  Filled 2024-05-19: qty 20, 10d supply, fill #0

## 2024-05-19 NOTE — Telephone Encounter (Signed)
 FYI Only or Action Required?: FYI only for provider: appointment scheduled on this morning.  Patient was last seen in primary care on 02/07/2024 by Meghan Dixon LABOR, MD.  Called Nurse Triage reporting Cough. Dry croupy cough  Symptoms began several weeks ago.  Interventions attempted: Other: pt got better and now cough has returned.  Symptoms are: gradually worsening.  Triage Disposition: See PCP When Office is Open (Within 3 Days)  Patient/caregiver understands and will follow disposition?: Yes                                Reason for Disposition  Cough has been present for > 3 weeks  Answer Assessment - Initial Assessment Questions 1. ONSET: When did the cough begin?      A couple weeks ago 2. SEVERITY: How bad is the cough today?      croupy 3. SPUTUM: Describe the color of your sputum (e.g., none, dry cough; clear, white, yellow, green)     clear 4. HEMOPTYSIS: Are you coughing up any blood? If Yes, ask: How much? (e.g., flecks, streaks, tablespoons, etc.)     no 5. DIFFICULTY BREATHING: Are you having difficulty breathing? If Yes, ask: How bad is it? (e.g., mild, moderate, severe)      no 6. FEVER: Do you have a fever? If Yes, ask: What is your temperature, how was it measured, and when did it start?     no 7. CARDIAC HISTORY: Do you have any history of heart disease? (e.g., heart attack, congestive heart failure)      no 8. LUNG HISTORY: Do you have any history of lung disease?  (e.g., pulmonary embolus, asthma, emphysema)     no  10. OTHER SYMPTOMS: Do you have any other symptoms? (e.g., runny nose, wheezing, chest pain)       Runny nose, and sinus congestion  Protocols used: Cough - Acute Non-Productive-A-AH

## 2024-05-19 NOTE — Patient Instructions (Addendum)
 A few things to remember from today's visit:  Cough, persistent - Plan: DG Chest 2 View, benzonatate  (TESSALON ) 100 MG capsule  Irregular heart rate - Plan: EKG 12-Lead, Basic metabolic panel with GFR Albuterol  inh 2 puff every 6 hours for a week then as needed for wheezing or shortness of breath.  Resume Mucinex , plain. Atrovent  nasal spray 3 times per day.  If you need refills for medications you take chronically, please call your pharmacy. Do not use My Chart to request refills or for acute issues that need immediate attention. If you send a my chart message, it may take a few days to be addressed, specially if I am not in the office.  Please be sure medication list is accurate. If a new problem present, please set up appointment sooner than planned today.

## 2024-05-20 ENCOUNTER — Other Ambulatory Visit: Payer: Self-pay
